# Patient Record
Sex: Female | Born: 1955 | Race: Black or African American | Hispanic: No | Marital: Single | Smoking: Former smoker
Health system: Southern US, Community
[De-identification: ages and names within clinical notes are randomized; demographics above are authoritative.]

## PROBLEM LIST (undated history)

## (undated) DIAGNOSIS — N189 Chronic kidney disease, unspecified: Secondary | ICD-10-CM

## (undated) DIAGNOSIS — D631 Anemia in chronic kidney disease: Secondary | ICD-10-CM

## (undated) DIAGNOSIS — Z973 Presence of spectacles and contact lenses: Secondary | ICD-10-CM

## (undated) DIAGNOSIS — E785 Hyperlipidemia, unspecified: Secondary | ICD-10-CM

## (undated) DIAGNOSIS — E11319 Type 2 diabetes mellitus with unspecified diabetic retinopathy without macular edema: Secondary | ICD-10-CM

## (undated) DIAGNOSIS — E079 Disorder of thyroid, unspecified: Secondary | ICD-10-CM

## (undated) DIAGNOSIS — I10 Essential (primary) hypertension: Secondary | ICD-10-CM

## (undated) DIAGNOSIS — N2581 Secondary hyperparathyroidism of renal origin: Secondary | ICD-10-CM

## (undated) DIAGNOSIS — D649 Anemia, unspecified: Secondary | ICD-10-CM

## (undated) DIAGNOSIS — N950 Postmenopausal bleeding: Secondary | ICD-10-CM

## (undated) DIAGNOSIS — A4902 Methicillin resistant Staphylococcus aureus infection, unspecified site: Secondary | ICD-10-CM

## (undated) DIAGNOSIS — E1340 Other specified diabetes mellitus with diabetic neuropathy, unspecified: Secondary | ICD-10-CM

## (undated) DIAGNOSIS — N183 Chronic kidney disease, stage 3 unspecified: Secondary | ICD-10-CM

## (undated) DIAGNOSIS — E119 Type 2 diabetes mellitus without complications: Secondary | ICD-10-CM

## (undated) DIAGNOSIS — M1990 Unspecified osteoarthritis, unspecified site: Secondary | ICD-10-CM

## (undated) HISTORY — PX: CORNEAL TRANSPLANT: SHX108

## (undated) HISTORY — PX: TRIGGER FINGER RELEASE: SHX641

## (undated) HISTORY — PX: CATARACT EXTRACTION W/ INTRAOCULAR LENS IMPLANT: SHX1309

## (undated) HISTORY — DX: Disorder of thyroid, unspecified: E07.9

## (undated) HISTORY — DX: Chronic kidney disease, unspecified: N18.9

## (undated) HISTORY — PX: CHOLECYSTECTOMY OPEN: SUR202

## (undated) HISTORY — DX: Methicillin resistant Staphylococcus aureus infection, unspecified site: A49.02

## (undated) HISTORY — PX: EYE SURGERY: SHX253

## (undated) HISTORY — DX: Essential (primary) hypertension: I10

## (undated) HISTORY — PX: APPLICATION OF WOUND VAC: SHX5189

## (undated) HISTORY — DX: Type 2 diabetes mellitus without complications: E11.9

## (undated) HISTORY — PX: CHOLECYSTECTOMY: SHX55

## (undated) HISTORY — DX: Hyperlipidemia, unspecified: E78.5

## (undated) HISTORY — DX: Anemia, unspecified: D64.9

---

## 1999-01-19 ENCOUNTER — Other Ambulatory Visit: Admission: RE | Admit: 1999-01-19 | Discharge: 1999-01-19 | Payer: Self-pay | Admitting: Family Medicine

## 1999-07-05 ENCOUNTER — Encounter: Admission: RE | Admit: 1999-07-05 | Discharge: 1999-07-05 | Payer: Self-pay | Admitting: Family Medicine

## 1999-07-05 ENCOUNTER — Encounter: Payer: Self-pay | Admitting: Family Medicine

## 2000-03-27 ENCOUNTER — Other Ambulatory Visit: Admission: RE | Admit: 2000-03-27 | Discharge: 2000-03-27 | Payer: Self-pay | Admitting: Family Medicine

## 2000-04-05 ENCOUNTER — Encounter: Admission: RE | Admit: 2000-04-05 | Discharge: 2000-07-04 | Payer: Self-pay | Admitting: Family Medicine

## 2001-09-12 HISTORY — PX: BILATERAL CARPAL TUNNEL RELEASE: SHX6508

## 2001-09-12 HISTORY — PX: TRIGGER FINGER RELEASE: SHX641

## 2002-01-04 ENCOUNTER — Other Ambulatory Visit: Admission: RE | Admit: 2002-01-04 | Discharge: 2002-01-04 | Payer: Self-pay | Admitting: Family Medicine

## 2002-01-11 ENCOUNTER — Encounter: Payer: Self-pay | Admitting: Family Medicine

## 2002-01-11 ENCOUNTER — Encounter: Admission: RE | Admit: 2002-01-11 | Discharge: 2002-01-11 | Payer: Self-pay | Admitting: Family Medicine

## 2002-03-12 HISTORY — PX: SHOULDER ARTHROSCOPY: SHX128

## 2002-04-16 ENCOUNTER — Encounter: Admission: RE | Admit: 2002-04-16 | Discharge: 2002-04-16 | Payer: Self-pay | Admitting: Family Medicine

## 2002-04-16 ENCOUNTER — Encounter: Payer: Self-pay | Admitting: Family Medicine

## 2002-04-29 ENCOUNTER — Encounter: Payer: Self-pay | Admitting: Surgery

## 2002-04-29 ENCOUNTER — Encounter (INDEPENDENT_AMBULATORY_CARE_PROVIDER_SITE_OTHER): Payer: Self-pay | Admitting: *Deleted

## 2002-04-29 ENCOUNTER — Ambulatory Visit (HOSPITAL_COMMUNITY): Admission: RE | Admit: 2002-04-29 | Discharge: 2002-04-29 | Payer: Self-pay | Admitting: Surgery

## 2002-08-19 ENCOUNTER — Ambulatory Visit (HOSPITAL_COMMUNITY): Admission: RE | Admit: 2002-08-19 | Discharge: 2002-08-19 | Payer: Self-pay | Admitting: Surgery

## 2002-08-19 ENCOUNTER — Encounter: Payer: Self-pay | Admitting: Surgery

## 2002-09-12 DIAGNOSIS — E890 Postprocedural hypothyroidism: Secondary | ICD-10-CM

## 2002-09-12 DIAGNOSIS — Z85850 Personal history of malignant neoplasm of thyroid: Secondary | ICD-10-CM

## 2002-09-12 HISTORY — PX: SHOULDER ARTHROSCOPY: SHX128

## 2002-09-12 HISTORY — PX: THYROID SURGERY: SHX805

## 2002-09-12 HISTORY — DX: Personal history of malignant neoplasm of thyroid: Z85.850

## 2002-09-12 HISTORY — DX: Postprocedural hypothyroidism: E89.0

## 2002-10-03 ENCOUNTER — Encounter (INDEPENDENT_AMBULATORY_CARE_PROVIDER_SITE_OTHER): Payer: Self-pay | Admitting: Specialist

## 2002-10-03 ENCOUNTER — Observation Stay (HOSPITAL_COMMUNITY): Admission: RE | Admit: 2002-10-03 | Discharge: 2002-10-04 | Payer: Self-pay | Admitting: Surgery

## 2002-10-03 HISTORY — PX: TOTAL THYROIDECTOMY: SHX2547

## 2004-02-13 ENCOUNTER — Encounter: Admission: RE | Admit: 2004-02-13 | Discharge: 2004-02-13 | Payer: Self-pay | Admitting: Surgery

## 2004-02-20 ENCOUNTER — Ambulatory Visit (HOSPITAL_COMMUNITY): Admission: RE | Admit: 2004-02-20 | Discharge: 2004-02-20 | Payer: Self-pay | Admitting: Surgery

## 2004-02-23 ENCOUNTER — Ambulatory Visit (HOSPITAL_COMMUNITY): Admission: RE | Admit: 2004-02-23 | Discharge: 2004-02-23 | Payer: Self-pay | Admitting: Surgery

## 2004-03-29 ENCOUNTER — Encounter (HOSPITAL_COMMUNITY): Admission: RE | Admit: 2004-03-29 | Discharge: 2004-06-27 | Payer: Self-pay | Admitting: Surgery

## 2004-04-16 ENCOUNTER — Encounter: Admission: RE | Admit: 2004-04-16 | Discharge: 2004-04-16 | Payer: Self-pay | Admitting: Surgery

## 2004-05-24 ENCOUNTER — Encounter (INDEPENDENT_AMBULATORY_CARE_PROVIDER_SITE_OTHER): Payer: Self-pay | Admitting: *Deleted

## 2004-05-24 ENCOUNTER — Ambulatory Visit (HOSPITAL_COMMUNITY): Admission: RE | Admit: 2004-05-24 | Discharge: 2004-05-24 | Payer: Self-pay | Admitting: Endocrinology

## 2004-08-20 ENCOUNTER — Ambulatory Visit (HOSPITAL_COMMUNITY): Admission: RE | Admit: 2004-08-20 | Discharge: 2004-08-20 | Payer: Self-pay | Admitting: Endocrinology

## 2004-08-30 ENCOUNTER — Encounter (HOSPITAL_COMMUNITY): Admission: RE | Admit: 2004-08-30 | Discharge: 2004-09-09 | Payer: Self-pay | Admitting: Endocrinology

## 2005-01-12 ENCOUNTER — Other Ambulatory Visit: Admission: RE | Admit: 2005-01-12 | Discharge: 2005-01-12 | Payer: Self-pay | Admitting: Family Medicine

## 2007-03-02 ENCOUNTER — Other Ambulatory Visit: Admission: RE | Admit: 2007-03-02 | Discharge: 2007-03-02 | Payer: Self-pay | Admitting: Family Medicine

## 2011-01-28 NOTE — Op Note (Signed)
NAME:  Courtney [name], Courtney [name]                      ACCOUNT NO.:  [id number]   MEDICAL RECORD NO.:  [id number]                   PATIENT TYPE:  AMB   LOCATION:  DAY                                  FACILITY:  [facility]   PHYSICIAN:  Coralie Keens, M.D.              DATE OF BIRTH:  Sep 08, 1956   DATE OF PROCEDURE:  10/03/2002  DATE OF DISCHARGE:                                 OPERATIVE REPORT   PREOPERATIVE DIAGNOSIS:  Multinodular goiter.   POSTOPERATIVE DIAGNOSIS:  Multinodular goiter.   PROCEDURE:  Total thyroidectomy.   SURGEON:  Douglas A. Ninfa Linden, M.D.   ASSISTANT:  Haywood Lasso, M.D.   ANESTHESIA:  General endotracheal.   ESTIMATED BLOOD LOSS:  Minimal.   FINDINGS:  The patient was found to have a multinodular goiterous thyroid  with a large portion being on the right thyroid gland.   PROCEDURE IN DETAIL:  The patient was brought to the operating room and  identified as Courtney [name].  She was placed supine on the operating table,  and general anesthesia was induced.  Her neck was then prepped and draped in  the usual sterile fashion.  A shoulder roll was placed prior to this.  Next,  a small transverse incision was made across the patient's lower neck.  The  incision was carried down through the platysma with the electrocautery.  The  anterior and superior skin flaps were then created with the electrocautery.  Next, the midline was identified and opened with the electrocautery.  The  underlying muscle layers were then retracted as dissection was carried out  on the left side of the neck.  The underlying thyroid gland was identified  and found to contain multiple small nodules.  The gland was elevated up out  of the neck.  Dissection was carried close to the capsule of the thyroid  gland.  The middle vein was easily identified and clipped once proximally  and distally and transected.  The superior pole was next dissected free.  The vessels of the superior pole were  controlled with 2-0 silk ties as well  as surgical clips before being transected.  Dissection was then carried  around the bases, and the vessels to the base were clipped, transecting them  as well.  The recurrent laryngeal nerve was identified on the left and  spared.  It was found to be present in the tracheoesophageal crus.  The  thyroid gland was then dissected further, pulling it up out of the neck,  dissecting along the trachea, and severing the attachments with the  electrocautery.  The isthmus was easily identified and included in the  dissection.  Next, our attention was turned toward the right side of the  neck.  The right lobe was tremendously larger with a very large nodule here,  compressing the trachea.  Several of the muscles were quite adherent to the  gland, and it was difficult to dissect them  off the top surface of the right  lobe of the thyroid gland.  Once again, the middle vein was identified and  clipped as the thyroid was elevated out of the neck.  The superior pole was  then again dissected free, and the vessels to the pole were taken down with  silk ties and surgical clips.  Dissection was then carried further around  the big nodule, and recurrent laryngeal nerve again was easily identified.  Dissection was continued close to the capsule and gland, and several  bridging veins and veins of the pole were clipped with small surgical clips  as the gland was further lifted out of the neck.  The remaining attachments  to the trachea were taken down with the electrocautery.  The entire specimen  was then completely removed from the neck and sent to pathology for  identification.  Both sides of the neck were examined, and hemostasis was  felt to be achieved after irrigating with saline.  A piece of Surgicel was  placed on each side of the neck.  Again, only the superior parathyroid on  each side could be identified.  The dissection, however, was carried right  on the gland  with the thyroid, and no other parathyroids were identified.  Once again, hemostasis was assured.  The midline was then closed with a  running 2-0 Vicryl suture.  The platysma was reapproximated with interrupted  3-0 Vicryl sutures, and the skin was closed with a running 4-0 Monocryl.  Steri-Strips, gauze, and tape were then applied.  The patient tolerated the  procedure well.  All sponge, needle, and instrument counts were correct at  the end of the procedure.  The patient was then extubated in the operating  room and taken in stable condition to the recovery room.                                               Coralie Keens, M.D.    DB/MEDQ  D:  10/03/2002  T:  10/03/2002  Job:  [id number]   cc:   Daiva Eves, M.D.

## 2011-09-13 DIAGNOSIS — A4902 Methicillin resistant Staphylococcus aureus infection, unspecified site: Secondary | ICD-10-CM

## 2011-09-13 HISTORY — DX: Methicillin resistant Staphylococcus aureus infection, unspecified site: A49.02

## 2012-06-12 DIAGNOSIS — Z8614 Personal history of Methicillin resistant Staphylococcus aureus infection: Secondary | ICD-10-CM

## 2012-06-12 HISTORY — DX: Personal history of Methicillin resistant Staphylococcus aureus infection: Z86.14

## 2012-07-06 LAB — COMPREHENSIVE METABOLIC PANEL
Albumin: 2.5 g/dL — ABNORMAL LOW (ref 3.4–5.0)
Alkaline Phosphatase: 185 U/L — ABNORMAL HIGH (ref 50–136)
Anion Gap: 14 (ref 7–16)
BUN: 12 mg/dL (ref 7–18)
Bilirubin,Total: 0.8 mg/dL (ref 0.2–1.0)
Calcium, Total: 8.6 mg/dL (ref 8.5–10.1)
Co2: 23 mmol/L (ref 21–32)
EGFR (African American): 49 — ABNORMAL LOW
EGFR (Non-African Amer.): 43 — ABNORMAL LOW
Potassium: 3.8 mmol/L (ref 3.5–5.1)
SGOT(AST): 41 U/L — ABNORMAL HIGH (ref 15–37)

## 2012-07-06 LAB — CBC
HCT: 37.6 % (ref 35.0–47.0)
HGB: 12.3 g/dL (ref 12.0–16.0)
MCH: 28 pg (ref 26.0–34.0)
MCHC: 32.7 g/dL (ref 32.0–36.0)
MCV: 86 fL (ref 80–100)
RBC: 4.38 10*6/uL (ref 3.80–5.20)
RDW: 13.7 % (ref 11.5–14.5)

## 2012-07-07 ENCOUNTER — Inpatient Hospital Stay: Payer: Self-pay | Admitting: Family Medicine

## 2012-07-07 LAB — URINALYSIS, COMPLETE
Glucose,UR: >=500 mg/dL (ref 0–75)
Leukocyte Esterase: NEGATIVE
Nitrite: NEGATIVE
Protein: NEGATIVE
Specific Gravity: 1.025 (ref 1.003–1.030)

## 2012-07-08 HISTORY — PX: INCISION AND DRAINAGE ABSCESS: SHX5864

## 2012-07-08 LAB — CBC WITH DIFFERENTIAL/PLATELET
HCT: 33.8 % — ABNORMAL LOW (ref 35.0–47.0)
MCHC: 31.2 g/dL — ABNORMAL LOW (ref 32.0–36.0)
MCV: 85 fL (ref 80–100)
Monocyte %: 7.3 %
Neutrophil #: 18.3 10*3/uL — ABNORMAL HIGH (ref 1.4–6.5)
Platelet: 221 10*3/uL (ref 150–440)
RDW: 13.8 % (ref 11.5–14.5)
WBC: 20.8 10*3/uL — ABNORMAL HIGH (ref 3.6–11.0)

## 2012-07-08 LAB — COMPREHENSIVE METABOLIC PANEL
Albumin: 1.8 g/dL — ABNORMAL LOW (ref 3.4–5.0)
Alkaline Phosphatase: 191 U/L — ABNORMAL HIGH (ref 50–136)
Anion Gap: 12 (ref 7–16)
BUN: 21 mg/dL — ABNORMAL HIGH (ref 7–18)
Bilirubin,Total: 0.9 mg/dL (ref 0.2–1.0)
Chloride: 98 mmol/L (ref 98–107)
Creatinine: 2.35 mg/dL — ABNORMAL HIGH (ref 0.60–1.30)
EGFR (African American): 26 — ABNORMAL LOW
SGOT(AST): 59 U/L — ABNORMAL HIGH (ref 15–37)
SGPT (ALT): 71 U/L (ref 12–78)
Total Protein: 6.4 g/dL (ref 6.4–8.2)

## 2012-07-09 LAB — CBC WITH DIFFERENTIAL/PLATELET
Eosinophil #: 0.4 10*3/uL (ref 0.0–0.7)
Eosinophil %: 2.1 %
Lymphocyte #: 0.8 10*3/uL — ABNORMAL LOW (ref 1.0–3.6)
MCH: 27.4 pg (ref 26.0–34.0)
MCHC: 32.3 g/dL (ref 32.0–36.0)
MCV: 85 fL (ref 80–100)
Monocyte #: 1.1 x10 3/mm — ABNORMAL HIGH (ref 0.2–0.9)
Monocyte %: 5.6 %
Neutrophil %: 87.7 %
Platelet: 235 10*3/uL (ref 150–440)
RBC: 3.81 10*6/uL (ref 3.80–5.20)
RDW: 14 % (ref 11.5–14.5)
WBC: 18.8 10*3/uL — ABNORMAL HIGH (ref 3.6–11.0)

## 2012-07-09 LAB — BASIC METABOLIC PANEL
Calcium, Total: 7.9 mg/dL — ABNORMAL LOW (ref 8.5–10.1)
Co2: 21 mmol/L (ref 21–32)
Creatinine: 2.88 mg/dL — ABNORMAL HIGH (ref 0.60–1.30)
EGFR (Non-African Amer.): 18 — ABNORMAL LOW
Glucose: 265 mg/dL — ABNORMAL HIGH (ref 65–99)
Osmolality: 284 (ref 275–301)
Potassium: 3.7 mmol/L (ref 3.5–5.1)
Sodium: 134 mmol/L — ABNORMAL LOW (ref 136–145)

## 2012-07-10 LAB — PROTEIN / CREATININE RATIO, URINE
Creatinine, Urine: 69.7 mg/dL (ref 30.0–125.0)
Protein, Random Urine: 33 mg/dL — ABNORMAL HIGH (ref 0–12)

## 2012-07-10 LAB — BASIC METABOLIC PANEL
BUN: 29 mg/dL — ABNORMAL HIGH (ref 7–18)
Calcium, Total: 7.3 mg/dL — ABNORMAL LOW (ref 8.5–10.1)
Chloride: 108 mmol/L — ABNORMAL HIGH (ref 98–107)
Creatinine: 2.45 mg/dL — ABNORMAL HIGH (ref 0.60–1.30)
Osmolality: 291 (ref 275–301)
Potassium: 3.6 mmol/L (ref 3.5–5.1)

## 2012-07-11 LAB — CBC WITH DIFFERENTIAL/PLATELET
Basophil #: 0 10*3/uL (ref 0.0–0.1)
Basophil %: 0.5 %
HGB: 9.6 g/dL — ABNORMAL LOW (ref 12.0–16.0)
Lymphocyte #: 0.8 10*3/uL — ABNORMAL LOW (ref 1.0–3.6)
Lymphocyte %: 9.9 %
MCH: 27.3 pg (ref 26.0–34.0)
MCV: 84 fL (ref 80–100)
Monocyte %: 10.2 %
Neutrophil #: 6.1 10*3/uL (ref 1.4–6.5)
Neutrophil %: 76.2 %
Platelet: 246 10*3/uL (ref 150–440)
RBC: 3.5 10*6/uL — ABNORMAL LOW (ref 3.80–5.20)
WBC: 8 10*3/uL (ref 3.6–11.0)

## 2012-07-11 LAB — BASIC METABOLIC PANEL
Anion Gap: 9 (ref 7–16)
BUN: 22 mg/dL — ABNORMAL HIGH (ref 7–18)
Calcium, Total: 7.6 mg/dL — ABNORMAL LOW (ref 8.5–10.1)
EGFR (African American): 31 — ABNORMAL LOW
EGFR (Non-African Amer.): 26 — ABNORMAL LOW
Glucose: 232 mg/dL — ABNORMAL HIGH (ref 65–99)
Osmolality: 288 (ref 275–301)

## 2012-07-12 LAB — BASIC METABOLIC PANEL
Anion Gap: 12 (ref 7–16)
BUN: 18 mg/dL (ref 7–18)
Calcium, Total: 7.5 mg/dL — ABNORMAL LOW (ref 8.5–10.1)
Chloride: 111 mmol/L — ABNORMAL HIGH (ref 98–107)
Co2: 20 mmol/L — ABNORMAL LOW (ref 21–32)
Glucose: 261 mg/dL — ABNORMAL HIGH (ref 65–99)
Osmolality: 296 (ref 275–301)
Potassium: 3.8 mmol/L (ref 3.5–5.1)
Sodium: 143 mmol/L (ref 136–145)

## 2012-07-12 LAB — CULTURE, BLOOD (SINGLE)

## 2012-07-13 LAB — BASIC METABOLIC PANEL
BUN: 15 mg/dL (ref 7–18)
Co2: 22 mmol/L (ref 21–32)
Creatinine: 1.66 mg/dL — ABNORMAL HIGH (ref 0.60–1.30)
EGFR (African American): 40 — ABNORMAL LOW
EGFR (Non-African Amer.): 34 — ABNORMAL LOW
Glucose: 217 mg/dL — ABNORMAL HIGH (ref 65–99)
Osmolality: 294 (ref 275–301)
Potassium: 4 mmol/L (ref 3.5–5.1)
Sodium: 144 mmol/L (ref 136–145)

## 2012-07-13 LAB — VANCOMYCIN, TROUGH: Vancomycin, Trough: 13 ug/mL (ref 10–20)

## 2012-07-14 LAB — BASIC METABOLIC PANEL
Anion Gap: 8 (ref 7–16)
Calcium, Total: 7.4 mg/dL — ABNORMAL LOW (ref 8.5–10.1)
Co2: 23 mmol/L (ref 21–32)
Creatinine: 1.48 mg/dL — ABNORMAL HIGH (ref 0.60–1.30)
EGFR (African American): 45 — ABNORMAL LOW
EGFR (Non-African Amer.): 39 — ABNORMAL LOW
Glucose: 255 mg/dL — ABNORMAL HIGH (ref 65–99)
Potassium: 3.7 mmol/L (ref 3.5–5.1)
Sodium: 142 mmol/L (ref 136–145)

## 2012-07-15 LAB — BASIC METABOLIC PANEL
BUN: 9 mg/dL (ref 7–18)
Chloride: 109 mmol/L — ABNORMAL HIGH (ref 98–107)
Co2: 21 mmol/L (ref 21–32)
Creatinine: 1.44 mg/dL — ABNORMAL HIGH (ref 0.60–1.30)
Potassium: 3.8 mmol/L (ref 3.5–5.1)
Sodium: 142 mmol/L (ref 136–145)

## 2012-07-16 LAB — PLATELET COUNT: Platelet: 293 10*3/uL (ref 150–440)

## 2013-11-01 ENCOUNTER — Other Ambulatory Visit: Payer: Self-pay | Admitting: Family Medicine

## 2013-11-01 DIAGNOSIS — Z1231 Encounter for screening mammogram for malignant neoplasm of breast: Secondary | ICD-10-CM

## 2013-11-19 ENCOUNTER — Ambulatory Visit: Payer: Self-pay

## 2013-11-29 ENCOUNTER — Ambulatory Visit
Admission: RE | Admit: 2013-11-29 | Discharge: 2013-11-29 | Disposition: A | Discharge status: Home / Self Care | Payer: BC Managed Care – PPO | Source: Ambulatory Visit | Attending: Family Medicine | Admitting: Family Medicine

## 2013-11-29 DIAGNOSIS — Z1231 Encounter for screening mammogram for malignant neoplasm of breast: Secondary | ICD-10-CM

## 2014-02-28 ENCOUNTER — Ambulatory Visit: Payer: Self-pay | Admitting: Nephrology

## 2014-08-22 ENCOUNTER — Ambulatory Visit (INDEPENDENT_AMBULATORY_CARE_PROVIDER_SITE_OTHER): Payer: BC Managed Care – PPO | Admitting: Internal Medicine

## 2014-08-22 ENCOUNTER — Encounter: Payer: Self-pay | Admitting: Internal Medicine

## 2014-08-22 VITALS — BP 122/68 | HR 78 | Temp 98.3°F | Resp 12 | Ht 67.5 in | Wt 244.0 lb

## 2014-08-22 DIAGNOSIS — E1129 Type 2 diabetes mellitus with other diabetic kidney complication: Secondary | ICD-10-CM

## 2014-08-22 DIAGNOSIS — C73 Malignant neoplasm of thyroid gland: Secondary | ICD-10-CM

## 2014-08-22 DIAGNOSIS — E1121 Type 2 diabetes mellitus with diabetic nephropathy: Secondary | ICD-10-CM

## 2014-08-22 DIAGNOSIS — E1165 Type 2 diabetes mellitus with hyperglycemia: Secondary | ICD-10-CM

## 2014-08-22 DIAGNOSIS — IMO0002 Reserved for concepts with insufficient information to code with codable children: Secondary | ICD-10-CM

## 2014-08-22 MED ORDER — INSULIN GLARGINE 300 UNIT/ML ~~LOC~~ SOPN: 60.0000 [IU] | PEN_INJECTOR | Freq: Every day | SUBCUTANEOUS | Status: DC

## 2014-08-22 MED ORDER — INSULIN ASPART 100 UNIT/ML FLEXPEN: 15.0000 [IU] | PEN_INJECTOR | Freq: Three times a day (TID) | SUBCUTANEOUS | Status: DC

## 2014-08-22 NOTE — Patient Instructions (Signed)
Please stop the 70/30 insulin regimen and start: - Toujeo 60 units at bedtime - NovoLog: 15 units with a regular meal 20 units with a larger meal  Please stop at the lab.  Please return in 1 month with your sugar log.   PATIENT INSTRUCTIONS FOR TYPE 2 DIABETES:  **Please join MyChart!** - see attached instructions about how to join if you have not done so already.  DIET AND EXERCISE Diet and exercise is an important part of diabetic treatment.  We recommended aerobic exercise in the form of brisk walking (working between 40-60% of maximal aerobic capacity, similar to brisk walking) for 150 minutes per week (such as 30 minutes five days per week) along with 3 times per week performing 'resistance' training (using various gauge rubber tubes with handles) 5-10 exercises involving the major muscle groups (upper body, lower body and core) performing 10-15 repetitions (or near fatigue) each exercise. Start at half the above goal but build slowly to reach the above goals. If limited by weight, joint pain, or disability, we recommend daily walking in a swimming pool with water up to waist to reduce pressure from joints while allow for adequate exercise.    BLOOD GLUCOSES Monitoring your blood glucoses is important for continued management of your diabetes. Please check your blood glucoses 2-4 times a day: fasting, before meals and at bedtime (you can rotate these measurements - e.g. one day check before the 3 meals, the next day check before 2 of the meals and before bedtime, etc.).   HYPOGLYCEMIA (low blood sugar) Hypoglycemia is usually a reaction to not eating, exercising, or taking too much insulin/ other diabetes drugs.  Symptoms include tremors, sweating, hunger, confusion, headache, etc. Treat IMMEDIATELY with 15 grams of Carbs: . 4 glucose tablets .  cup regular juice/soda . 2 tablespoons raisins . 4 teaspoons sugar . 1 tablespoon honey Recheck blood glucose in 15 mins and repeat above  if still symptomatic/blood glucose <100.  RECOMMENDATIONS TO REDUCE YOUR RISK OF DIABETIC COMPLICATIONS: * Take your prescribed MEDICATION(S) * Follow a DIABETIC diet: Complex carbs, fiber rich foods, (monounsaturated and polyunsaturated) fats * AVOID saturated/trans fats, high fat foods, >2,300 mg salt per day. * EXERCISE at least 5 times a week for 30 minutes or preferably daily.  * DO NOT SMOKE OR DRINK more than 1 drink a day. * Check your FEET every day. Do not wear tightfitting shoes. Contact us if you develop an ulcer * See your EYE doctor once a year or more if needed * Get a FLU shot once a year * Get a PNEUMONIA vaccine once before and once after age 65 years  GOALS:  * Your Hemoglobin A1c of <7%  * fasting sugars need to be <130 * after meals sugars need to be <180 (2h after you start eating) * Your Systolic BP should be [id number] or lower  * Your Diastolic BP should be 80 or lower  * Your HDL (Good Cholesterol) should be 40 or higher  * Your LDL (Bad Cholesterol) should be 100 or lower. * Your Triglycerides should be 150 or lower  * Your Urine microalbumin (kidney function) should be <30 * Your Body Mass Index should be 25 or lower    Please consider the following ways to cut down carbs and fat and increase fiber and micronutrients in your diet: - substitute whole grain for white bread or pasta - substitute brown rice for white rice - substitute 90-calorie flat bread pieces for slices of  bread when possible - substitute sweet potatoes or yams for white potatoes - substitute humus for margarine - substitute tofu for cheese when possible - substitute almond or rice milk for regular milk (would not drink soy milk daily due to concern for soy estrogen influence on breast cancer risk) - substitute dark chocolate for other sweets when possible - substitute water - can add lemon or orange slices for taste - for diet sodas (artificial sweeteners will trick your body that you can eat  sweets without getting calories and will lead you to overeating and weight gain in the long run) - do not skip breakfast or other meals (this will slow down the metabolism and will result in more weight gain over time)  - can try smoothies made from fruit and almond/rice milk in am instead of regular breakfast - can also try old-fashioned (not instant) oatmeal made with almond/rice milk in am - order the dressing on the side when eating salad at a restaurant (pour less than half of the dressing on the salad) - eat as little meat as possible - can try juicing, but should not forget that juicing will get rid of the fiber, so would alternate with eating raw veg./fruits or drinking smoothies - use as little oil as possible, even when using olive oil - can dress a salad with a mix of balsamic vinegar and lemon juice, for e.g. - use agave nectar, stevia sugar, or regular sugar rather than artificial sweateners - steam or broil/roast veggies  - snack on veggies/fruit/nuts (unsalted, preferably) when possible, rather than processed foods - reduce or eliminate aspartame in diet (it is in diet sodas, chewing gum, etc) Read the labels!  Try to read Dr. Janene Harvey book: "Program for Reversing Diabetes" for other ideas for healthy eating.

## 2014-08-22 NOTE — Progress Notes (Addendum)
Patient ID: Courtney [name], female   DOB: [birth year], 58 y.o.   MRN: [id number]  HPI: Courtney [name] is a 58 y.o.-year-old female, referred by her PCP, Dr. Daiva Eves, for management of DM2, dx in ~2000, insulin-dependent since 2011, uncontrolled, with complications (CKD - sees nephrology, DR) also postsurgical hypothyroidism for Thyroid cancer.  DM2: Last hemoglobin A1c was: 08/01/2014: HbA1c 14.5% 05/02/2014: HbA1c 14.8% 01/31/2014: HbA1c 13.6% 11/01/2013: HbA1c 15.1%  Pt is on a regimen of: - Novolin 70/30 48 units 2x a day 15 min before the meals - but may forget - misses 5/14 doses a week - Amaryl 8 mg in am She was on Metformin >> stopped 2/2 CKD.  Pt travels for her job, and works both day + night shift, 4 days a week>> just started to check her sugars 1x a day and they are: - am: 308-333, before: 150s - 2h after b'fast: n/c - before lunch: n/c - 2h after lunch: n/c - before dinner: n/c - 2h after dinner: n/c - bedtime: n/c - nighttime: n/c No lows. Lowest sugar was 157; ? hypoglycemia awareness Highest sugar was 333.  Glucometer: Prodigy  Pt's meals are - she started to make changes - but still skips: - Breakfast: varies - mostly eats out - may skip. 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  Pt has FH of DM in older sister, mother with prediabetes.  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 She did not have RAI treatment at that time, however, she was found to have a thyroid mass in 2005. This was biopsied and returned as normal thyroid tissue. However, she had RAI treatment at that time. The posttreatment whole-body scan was negative for any cancer spread. She did not have consistent follow-up in the last 10 years  No neck compression symptoms.  Last TSH: 08/01/2014: TSH 1.89  She is on LT4 150 mcg daily: - in am - with water - eats b'fast 30 min later - No calcium, iron, PPI, MVI  ROS: Constitutional: no weight gain/loss, no fatigue, + subjective hypothermia Eyes: no blurry vision, no xerophthalmia ENT: no sore throat, see history of present illness, no hoarseness Cardiovascular: no CP/SOB/palpitations/+ leg swelling Respiratory: no cough/SOB Gastrointestinal: no N/V/D/C Musculoskeletal: no muscle/joint aches Skin: no rashes, + itching Neurological: no tremors/numbness/tingling/dizziness Psychiatric: no depression/anxiety  Past Medical History  Diagnosis Date  . Diabetes mellitus without complication   . Hypertension   . Thyroid disease   . Chronic kidney disease   . Anemia   . MRSA infection 2013    on leg; surgery    Past Surgical History  Procedure Laterality Date  . 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 Cholecystectomy    . Trigger finger release  2003  . Bilateral carpal tunnel release Bilateral 2003  . Shoulder arthroscopy  2004  . Thyroid surgery  2004   History   Social History Main Topics  . Smoking status: Former Research scientist (life sciences)  . Smokeless tobacco: Not on file  . Alcohol Use: No  . Drug Use: No   Social History Narrative   Single   0 children   Copy (travels regularly)      Beer and wine on occasion   First menstrual cycle: 6th grade   Postmenopausal      Current Outpatient Rx  Name  Route  Sig  Dispense  Refill  . amLODipine (NORVASC) 10 MG tablet   Oral    Take 10 mg by mouth daily.          . diphenhydrAMINE (BENADRYL ALLERGY) 25 MG tablet   Oral   Take 25 mg by mouth as needed.          Marland Kitchen glimepiride (AMARYL) 4 MG tablet   Oral   Take 8 mg by mouth daily with breakfast.          . insulin NPH-regular Human (NOVOLIN 70/30) (70-30) 100 UNIT/ML injection   Subcutaneous   Inject 48 Units into the skin 2 (two) times daily with a meal.          . levothyroxine (SYNTHROID, LEVOTHROID) 150 MCG tablet   Oral   Take 150 mcg by mouth daily before breakfast.          . losartan (COZAAR) 100 MG tablet   Oral   Take 100 mg by mouth daily.           NKDA   Family History  Problem Relation Age of Onset  . Hypertension Mother   . Thyroid disease Mother   . Hypertension Sister   . Thyroid disease Sister   . Hypertension Brother   . Hyperlipidemia Brother    PE: BP 122/68 mmHg  Pulse 78  Temp(Src) 98.3 F (36.8 C) (Oral)  Resp 12  Ht 5' 7.5" (1.715 m)  Wt 244 lb (110.678 kg)  BMI 37.63 kg/m2  SpO2 97% Wt Readings from Last 3 Encounters:  08/22/14 244 lb (110.678 kg)   Constitutional: overweight, in NAD Eyes: PERRLA, EOMI, no exophthalmos ENT: moist mucous membranes, no neck masses, no cervical lymphadenopathy Cardiovascular: RRR, No MRG Respiratory: CTA B Gastrointestinal: abdomen soft, NT, ND, BS+ Musculoskeletal: no deformities, strength intact in all 4 Skin: moist, warm, no rashes Neurological: no tremor with outstretched hands, DTR normal in all 4  ASSESSMENT: 1. DM2, insulin-dependent, uncontrolled, with complications - CKD - DR  PLAN:  1. Patient with long-standing, uncontrolled diabetes, on premixed insulin regimen, with intermittent noncompliance with the insulin doses and diet. She recently started to make changes in her diet, however these are not the best, for example, she switched from regular soda as to [place] aid. Recently, she started to add an artificial sweetener in water instead of the sweet  drinks that she was drinking.  Her sugars are very high in the morning and she is not checking later in the day. Advised her to start checking 3 times a day. We discussed about the fact that the premixed insulin regimen, though inexpensive, does not offer her enough flexibility, and I suggested to space to basal bolus regimen. She agrees to try this. -  I suggested to:  Patient Instructions  Please stop the 70/30 insulin regimen and start: - Toujeo 60 units at bedtime - NovoLog: 15 units with a regular meal 20 units with a larger meal  Please stop at the lab.  Please return in 1 month with your sugar log.   - Strongly advised her to start checking sugars at different times of the day - check 3 times a day, rotating checks - given sugar log and advised how to fill it and to bring it at next appt  - given foot care handout and explained the principles  - given instructions for hypoglycemia management "15-15 rule"  - advised for yearly eye exams - Return to clinic in 1 mo with sugar log   2. 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 Please see his HPI for further details. - We'll check a thyroglobulin and antithyroglobulin antibodies today - If the thyroglobulin is elevated, she will need a neck ultrasound - However, we discussed that we can consider her cured, since the cancer was subcentimeter and noninvasive  - time spent with the patient: 1 hour, of which >50% was spent in obtaining information about her diabetes and thyroid cancer, reviewing her previous labs, evaluations, and treatments, counseling her about her conditions (please see the discussed topics above), and developing a plan to further investigate and treat them. She had a number of questions which I addressed.  Component      Latest Ref Rng 08/22/2014  Thyroglobulin     2.8 - 40.9 ng/mL 2.3 (L)  Thyroglobulin Ab     <2 IU/mL <1  Thyroglobulin Antibody     0.0 - 0.9 IU/mL <1.0   Because the thyroglobulin is detectable, I would like to obtain a neck ultrasound to investigate for recurrences.  Thyroid U/S scheduled for 09/19/2013. CLINICAL DATA: Bilateral total thyroidectomy for thyroid cancer.  EXAM: THYROID ULTRASOUND  TECHNIQUE: Ultrasound examination of the thyroid gland and adjacent soft tissues was performed.  COMPARISON: Thyroid ultrasound - 02/13/2004; right thyroid thyroid nodule fine-needle aspiration - 05/24/2004; I-131 nuclear medicine whole-body scan - 08/30/2004  FINDINGS: Right thyroid lobe  There is an approximately 1.4 x 0.8 x 1.0 cm echogenic solid nodule within the inferior aspect of the right thyroid resection bed which appears similar to remote examination performed 02/13/2004 at which time this soft tissue measured approximately 1.8 x 1.0 x 1.0 cm - this nodular soft tissue was previously biopsied on 05/24/2004 and did not definitively demonstrate increased radiotracer uptake on post I-131 nuclear medicine whole-body scan performed 08/30/2004.  Note is made of an approximately 0.4 x 0.4 x 0.4 cm hypoechoic nodule also within the right thyroidectomy bed, not definitely seen on the prior examinations. This nodule may contain a minimal amount of eccentric internal echogenicity which may represent which may represent a fatty hilum.  Left thyroid lobe  Surgically absent. There is no residual nodular soft tissue within the left thyroidectomy bed to suggest locally recurrent disease.  Isthmus  Surgically absent. There is no residual nodular soft tissue within the thyroid isthmic resection bed to suggest locally recurrent disease.  Lymphadenopathy  None visualized.  IMPRESSION: 1. Post total thyroidectomy. 2. No change in the previously biopsied approximately 1.4  cm echogenic solid nodule within the inferior aspect of the right lobe of the thyroid, grossly unchanged since the 2005 examination. 3. Apparent development of an approximately 0.4 cm hypoechoic nodule within the right thyroidectomy bed - while too small for definitive characterization, this nodule appears to contain an echogenic hilum and thus is favored to represent a non pathologically enlarged  cervical lymph node.   Electronically Signed By: Sandi Mariscal M.D. On: 09/19/2014 15:52  No suspicious nodules >> will need to repeat Tg + ATA in ~ 6 mo ([facility]) and thyroid U/S in 1 year.

## 2014-08-25 LAB — THYROGLOBULIN ANTIBODY: Thyroglobulin Antibody: <1 IU/mL (ref 0.0–0.9)

## 2014-08-27 LAB — THYROGLOBULIN ANTIBODY: Thyroglobulin Ab: <1 IU/mL (ref <2)

## 2014-08-27 LAB — THYROGLOBULIN LEVEL: THYROGLOBULIN: 2.3 ng/mL — AB (ref 2.8–40.9)

## 2014-08-28 DIAGNOSIS — E1165 Type 2 diabetes mellitus with hyperglycemia: Secondary | ICD-10-CM

## 2014-08-28 DIAGNOSIS — C73 Malignant neoplasm of thyroid gland: Secondary | ICD-10-CM | POA: Insufficient documentation

## 2014-08-28 DIAGNOSIS — IMO0002 Reserved for concepts with insufficient information to code with codable children: Secondary | ICD-10-CM | POA: Insufficient documentation

## 2014-08-28 DIAGNOSIS — E1121 Type 2 diabetes mellitus with diabetic nephropathy: Secondary | ICD-10-CM | POA: Insufficient documentation

## 2014-09-12 HISTORY — PX: CATARACT EXTRACTION W/ INTRAOCULAR LENS IMPLANT: SHX1309

## 2014-09-15 ENCOUNTER — Encounter: Payer: Self-pay | Admitting: Internal Medicine

## 2014-09-19 ENCOUNTER — Ambulatory Visit
Admission: RE | Admit: 2014-09-19 | Discharge: 2014-09-19 | Disposition: A | Discharge status: Home / Self Care | Payer: Self-pay | Source: Ambulatory Visit | Attending: Internal Medicine | Admitting: Internal Medicine

## 2014-09-22 NOTE — Addendum Note (Signed)
Addended by: [name] on: 09/22/2014 12:40 PM   Modules accepted: Level of Service

## 2014-10-03 ENCOUNTER — Ambulatory Visit: Payer: BC Managed Care – PPO | Admitting: Internal Medicine

## 2014-10-10 ENCOUNTER — Ambulatory Visit (INDEPENDENT_AMBULATORY_CARE_PROVIDER_SITE_OTHER): Payer: BLUE CROSS/BLUE SHIELD | Admitting: Internal Medicine

## 2014-10-10 ENCOUNTER — Encounter: Payer: Self-pay | Admitting: Internal Medicine

## 2014-10-10 VITALS — BP 138/70 | HR 89 | Temp 97.9°F | Resp 12 | Wt 242.0 lb

## 2014-10-10 DIAGNOSIS — E1121 Type 2 diabetes mellitus with diabetic nephropathy: Secondary | ICD-10-CM

## 2014-10-10 DIAGNOSIS — E119 Type 2 diabetes mellitus without complications: Secondary | ICD-10-CM

## 2014-10-10 DIAGNOSIS — C73 Malignant neoplasm of thyroid gland: Secondary | ICD-10-CM

## 2014-10-10 DIAGNOSIS — E1129 Type 2 diabetes mellitus with other diabetic kidney complication: Secondary | ICD-10-CM

## 2014-10-10 DIAGNOSIS — IMO0002 Reserved for concepts with insufficient information to code with codable children: Secondary | ICD-10-CM

## 2014-10-10 DIAGNOSIS — E1165 Type 2 diabetes mellitus with hyperglycemia: Secondary | ICD-10-CM

## 2014-10-10 MED ORDER — INSULIN DEGLUDEC 200 UNIT/ML ~~LOC~~ SOPN: 60.0000 [IU] | PEN_INJECTOR | Freq: Every day | SUBCUTANEOUS | Status: DC

## 2014-10-10 MED ORDER — GLUCOSE BLOOD VI STRP: ORAL_STRIP | Status: DC

## 2014-10-10 NOTE — Progress Notes (Signed)
Patient ID: Courtney [name], female   DOB: 1956/02/09, 59 y.o.   MRN: [id number]  HPI: Courtney [name] is a 59 y.o.-year-old female, initially referred by her PCP, Dr. Daiva Eves, for management of DM2, dx in ~2000, insulin-dependent since 2011, uncontrolled, with complications (CKD - sees nephrology, DR) also postsurgical hypothyroidism for Thyroid cancer. Last visit 1.5 months ago.  DM2: Last hemoglobin A1c was: 08/01/2014: HbA1c 14.5% 05/02/2014: HbA1c 14.8% 01/31/2014: HbA1c 13.6% 11/01/2013: HbA1c 15.1%  Pt was on a regimen of: - Novolin 70/30 48 units 2x a day 15 min before the meals - but may forget - misses 5/14 doses a week - Amaryl 8 mg in am She was on Metformin >> stopped 2/2 CKD.  At last visit, we switched to: - Toujeo 60 units at bedtime - NovoLog 2x a day (skips lunch - joint and mm pain): 15 units with a regular meal 20 units with a larger meal  Pt travels for her job, and works both day + night shift, 4 days a week.  Sugars are greatly improved: - am: 308-333, before: 150s >> 76-150, 160 - 2h after b'fast: n/c - before lunch: n/c >> 72-149 - 2h after lunch: n/c >> 67, 228 - before dinner: n/c >> 65-37, 190-207 (if eats a larger lunch) - 2h after dinner: n/c >> 131, 269 - bedtime: n/c >> 81-160 - nighttime: n/c No lows. Lowest sugar was 65; ? hypoglycemia awareness Highest sugar was  200s.  Glucometer: Prodigy  Pt's meals are - she started to make changes - but still skips: - Breakfast: varies - mostly eats out - may skip. Oatmeal, sausage or bacon, apples - Lunch: peanuts - Dinner: frozen dinner - Snacks: fruit, small bag potato chips, peanuts Drinks Cool Aid instead of sodas now >> started to drink non-sweet drinks  - + stage 3 CKD, last BUN/creatinine:  08/01/2014: 15/1.26, GFR 55 - last set of lipids: 08/01/2014: 145/78/74/55 - last eye exam was in 08/15/2014 >> DR in [facility], had Laser Sx, also getting intraocular injections; L eye cataract - no  numbness and tingling in her feet.  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 She did not have RAI treatment at that time, however, she was found to have a thyroid mass in 2005. This was biopsied and returned as normal thyroid tissue. However, she had RAI treatment at that time. The posttreatment whole-body scan was negative for any cancer spread. She did not have consistent follow-up in the last 10 years  No neck compression symptoms.  At last visit, we checked a thyroglobulin, and this returned at 2.3. At that time, I ordered a neck ultrasound that showed a stable nodule with a fatty hilum, consistent with a benign lymph node, but no other masses in the surgical bed. I plan to repeat the thyroglobulin at [place] in 6 months  Postsurgical hypothyroidism:  Last TSH:  08/01/2014: TSH 1.89  She is on LT4 150 mcg daily: - in am - with water - eats b'fast 30 min later - No calcium, iron, PPI, MVI  ROS: Constitutional: no weight gain/loss, no fatigue, + subjective hypothermia Eyes: no blurry vision, no xerophthalmia ENT: no sore throat, see history of present illness, no hoarseness Cardiovascular: no CP/SOB/palpitations/+ leg swelling Respiratory: no cough/SOB Gastrointestinal: no N/V/D/C Musculoskeletal: no  muscle/joint aches Skin: no rashes, + itching Neurological: no tremors/numbness/tingling/dizziness Psychiatric: no depression/anxiety  I reviewed pt's medications, allergies, PMH, social hx, family hx, and changes were documented in the history of present illness. Otherwise, unchanged from my initial visit note.  Past Medical History  Diagnosis Date  . Diabetes mellitus without complication   . Hypertension   . Thyroid disease   . Chronic kidney disease   . Anemia   .  MRSA infection 2013    on leg; surgery    Past Surgical History  Procedure Laterality Date  . 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 Cholecystectomy    . Trigger finger release  2003  . Bilateral carpal tunnel release Bilateral 2003  . Shoulder arthroscopy  2004  . Thyroid surgery  2004   History   Social History Main Topics  . Smoking status: Former Research scientist (life sciences)  . Smokeless tobacco: Not on file  . Alcohol Use: No  . Drug Use: No   Social History Narrative   Single   0 children   Copy (travels regularly)      Beer and wine on occasion   First menstrual cycle: 6th grade   Postmenopausal      Current Outpatient Rx  Name  Route  Sig  Dispense  Refill  . amLODipine (NORVASC) 10 MG tablet   Oral   Take 10 mg by mouth daily.          . diphenhydrAMINE (BENADRYL ALLERGY) 25 MG tablet   Oral   Take 25 mg by mouth as needed.          Marland Kitchen glimepiride (AMARYL) 4 MG tablet   Oral   Take 8 mg by mouth daily with breakfast.          . insulin aspart (NOVOLOG FLEXPEN) 100 UNIT/ML FlexPen   Subcutaneous   Inject 15-20 Units into the skin 3 (three) times daily with meals. Inject 15 min before meals.   15 mL   2   . Insulin Glargine (TOUJEO SOLOSTAR) 300 UNIT/ML SOPN   Subcutaneous   Inject 60 Units into the skin at bedtime.   3 pen   2   . levothyroxine (SYNTHROID, LEVOTHROID) 150 MCG tablet   Oral   Take 150 mcg by mouth daily before breakfast.          . losartan (COZAAR) 100 MG tablet   Oral   Take 100 mg by mouth daily.           NKDA   Family History  Problem Relation Age of Onset  . Hypertension Mother   . Thyroid disease Mother   . Hypertension Sister   . Thyroid disease Sister   . Hypertension Brother   . Hyperlipidemia Brother    PE: BP 138/70 mmHg  Pulse 89  Temp(Src) 97.9 F (36.6 C) (Oral)  Resp 12  Wt 242 lb (109.77 kg)  SpO2 99% Wt Readings from Last 3 Encounters:  10/10/14 242 lb (109.77 kg)  08/22/14 244 lb (110.678 kg)    Constitutional: overweight, in NAD Eyes: PERRLA, EOMI, no exophthalmos ENT: moist mucous membranes, no neck masses palpable, no cervical lymphadenopathy Cardiovascular: RRR, No MRG Respiratory: CTA B Gastrointestinal: abdomen soft, NT, ND, BS+ Musculoskeletal: no deformities, strength intact in all 4 Skin: moist, warm, no rashes Neurological: no tremor with outstretched hands, DTR normal in all 4  ASSESSMENT: 1. DM2, insulin-dependent, uncontrolled, with complications - CKD - DR  2. 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 She did not have RAI treatment at that time, however, she was found to have a thyroid mass in 2005. This was biopsied and returned as normal thyroid tissue. However, she had RAI treatment at that time. The posttreatment whole-body scan was negative for any cancer spread. She did not have consistent follow-up in the last 10 years  Component     Latest Ref Rng 08/22/2014  Thyroglobulin     2.8 - 40.9 ng/mL 2.3 (L)  Thyroglobulin Ab     <2 IU/mL <1  Thyroglobulin Antibody     0.0 - 0.9 IU/mL <1.0   Last U/S (09/19/2014): 1. Post total thyroidectomy. 2. No change in the previously biopsied approximately 1.4 cm echogenic solid nodule within the inferior aspect of the right lobe of the thyroid, grossly unchanged since the 2005 examination. 3. Apparent development of an approximately 0.4 cm hypoechoic nodule within the right thyroidectomy bed - while too small for definitive characterization, this nodule appears to contain an echogenic hilum and thus is favored to represent a non pathologically enlarged cervical lymph node.  3. Post surgical hypothyroidism  PLAN:  1. Patient with long-standing, uncontrolled diabetes, now on basal-bolus insulin regimen started at last visit, we significantly  improved control. She also started to make changes in her diet, which greatly helps. Due to the fact that she works days alternating with nights, I suggested that we switched from [place] to [place], which can be administered at any time of the day. I advised her to take it at bedtime, whichever that might be: A.m. or evening. We will start the U200 pen, since this contains more units/pen. -  I suggested to:  Patient Instructions  Please stop Toujeo and start Tresiba U200 pen 60 units at bedtime. Continue  NovoLog 15 min before breakfast and dinner: 15 units with a regular meal 20 units with a larger meal  Please return in 1.5 month with your sugar log.  - She continues to Amaryl 8 mg in the morning. I will leave her on this medication, since no significant lows, but I advised her to let me know if she develops more sugars in the 60s. The main reason to continue his the fact that she is not taking insulin with lunch. - Continue checking sugars at different times of the day - check 3 times a day, rotating checks - given more sugar logs  - advised for yearly eye exams she is up-to-date - Return in about 6 weeks (around 11/21/2014).  2. 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 Please see his HPI for further details. - we reviewed her last thyroid ultrasound together, and I explained that the mass that was seen appears to be a lymph node with a fatty hilum, which is a characteristic of benign lymph nodes. - We'll check a thyroglobulin and antithyroglobulin antibodiat next visit, but I would like to order this with the [facility] assay  - However, I believe that we can consider her cured, since the cancer was subcentimeter and noninvasive  3. Postsurgical hypothyroidism - She is taking her levothyroxine  correctly - We will check her TFTs at next visit along with the rest of the labs

## 2014-10-10 NOTE — Patient Instructions (Signed)
Please stop Toujeo and start [place] U200 pen 60 units at bedtime. Continue  NovoLog 15 min before breakfast and dinner: 15 units with a regular meal 20 units with a larger meal  Please return in 1.5 month with your sugar log.

## 2014-11-21 ENCOUNTER — Ambulatory Visit: Payer: BLUE CROSS/BLUE SHIELD | Admitting: Internal Medicine

## 2014-11-24 ENCOUNTER — Telehealth: Payer: Self-pay | Admitting: Internal Medicine

## 2014-11-24 NOTE — Telephone Encounter (Signed)
We can try Humalog. If not covered, then need to use R insulin, but this does not come in a pen.Marland KitchenMarland Kitchen

## 2014-11-24 NOTE — Telephone Encounter (Signed)
Please read message below and advise.  

## 2014-11-24 NOTE — Telephone Encounter (Signed)
Patient stated that her pharmacy is charging her $104.00 for her Novalog, she can't afford that is there another alternative. Please Advise

## 2014-11-25 ENCOUNTER — Other Ambulatory Visit: Payer: Self-pay | Admitting: *Deleted

## 2014-11-25 ENCOUNTER — Telehealth: Payer: Self-pay | Admitting: Internal Medicine

## 2014-11-25 ENCOUNTER — Telehealth: Payer: Self-pay | Admitting: *Deleted

## 2014-11-25 MED ORDER — INSULIN ASPART 100 UNIT/ML ~~LOC~~ SOLN: 15.0000 [IU] | Freq: Three times a day (TID) | SUBCUTANEOUS | Status: DC

## 2014-11-25 MED ORDER — INSULIN PEN NEEDLE 32G X 4 MM MISC: Status: DC

## 2014-11-25 MED ORDER — "INSULIN SYRINGE-NEEDLE U-100 30G X 1/2"" 0.5 ML MISC": Status: DC

## 2014-11-25 MED ORDER — INSULIN LISPRO 100 UNIT/ML (KWIKPEN): 15.0000 [IU] | PEN_INJECTOR | Freq: Three times a day (TID) | SUBCUTANEOUS | Status: DC

## 2014-11-25 MED ORDER — INSULIN REGULAR HUMAN 100 UNIT/ML IJ SOLN: INTRAMUSCULAR | Status: DC

## 2014-11-25 NOTE — Telephone Encounter (Signed)
Returned pt's call. We are going to switch to Novolin R (Relion Brand), same dosage, vials. Pt understood. Rx sent to pt's pharmacy.

## 2014-11-25 NOTE — Telephone Encounter (Signed)
We switched Novolog to Humalog. Ins rejected this as well. Please advise what to do moving forward.

## 2014-11-25 NOTE — Telephone Encounter (Signed)
Called pt and advised her per Dr Arman Filter note. Pt stated that Novolog is not a Tier 1 med. She said we could send the Humalog and she will see if it is covered by her ins and let us know.

## 2014-11-25 NOTE — Telephone Encounter (Signed)
Patient stated that her insulin medication was $200 she is not able to afford it. She stated it was ok to be put on the Vial it is cheaper. Please advise

## 2014-11-25 NOTE — Telephone Encounter (Signed)
Insurance denied Humalog pens. They will cover Novolog. Sending vials instead of pens to see if they will cover this at a lower cost.

## 2014-11-25 NOTE — Telephone Encounter (Signed)
Unfortunately, need R insulin vial. Please also send syringes.

## 2014-12-26 ENCOUNTER — Encounter: Payer: Self-pay | Admitting: Internal Medicine

## 2014-12-26 ENCOUNTER — Ambulatory Visit (INDEPENDENT_AMBULATORY_CARE_PROVIDER_SITE_OTHER): Payer: BLUE CROSS/BLUE SHIELD | Admitting: Internal Medicine

## 2014-12-26 VITALS — BP 108/66 | HR 76 | Temp 98.2°F | Resp 12 | Wt 235.0 lb

## 2014-12-26 DIAGNOSIS — C73 Malignant neoplasm of thyroid gland: Secondary | ICD-10-CM | POA: Diagnosis not present

## 2014-12-26 DIAGNOSIS — E890 Postprocedural hypothyroidism: Secondary | ICD-10-CM | POA: Diagnosis not present

## 2014-12-26 DIAGNOSIS — E1165 Type 2 diabetes mellitus with hyperglycemia: Secondary | ICD-10-CM

## 2014-12-26 DIAGNOSIS — E1129 Type 2 diabetes mellitus with other diabetic kidney complication: Secondary | ICD-10-CM | POA: Diagnosis not present

## 2014-12-26 DIAGNOSIS — IMO0002 Reserved for concepts with insufficient information to code with codable children: Secondary | ICD-10-CM

## 2014-12-26 DIAGNOSIS — E1121 Type 2 diabetes mellitus with diabetic nephropathy: Secondary | ICD-10-CM

## 2014-12-26 LAB — HEMOGLOBIN A1C: HEMOGLOBIN A1C: 10.9 % — AB (ref 4.6–6.5)

## 2014-12-26 LAB — T4, FREE: FREE T4: 1 ng/dL (ref 0.60–1.60)

## 2014-12-26 LAB — TSH: TSH: 2.72 u[IU]/mL (ref 0.35–4.50)

## 2014-12-26 NOTE — Progress Notes (Signed)
Patient ID: Courtney [name], female   DOB: 07-21-1956, 59 y.o.   MRN: [id number]  HPI: Courtney [name] is a 59 y.o.-year-old female-year-old female, initially referred by her PCP, Dr. Daiva Eves, for management of DM2, dx in ~2000, insulin-dependent since 2011, uncontrolled, with complications (CKD - sees nephrology, DR) also postsurgical hypothyroidism for Thyroid cancer. Last visit 2.5 months ago.  DM2: Last hemoglobin A1c was: 08/01/2014: HbA1c 14.5% 05/02/2014: HbA1c 14.8% 01/31/2014: HbA1c 13.6% 11/01/2013: HbA1c 15.1%  Pt was on a regimen of: - Novolin 70/30 48 units 2x a day 15 min before the meals - but may forget - misses 5/14 doses a week - Amaryl 8 mg in am She was on Metformin >> stopped 2/2 CKD.  At last visit, we switched to: - Toujeo >> Tresiba U200 60 units at bedtime - ReliOn Novolin 2x a day (skips lunch - joint and mm pain): 15 units with a regular meal 20 units with a larger meal  She did not check sugars in last month. Before this: - am: 308-333, before: 150s >> 76-150, 160 >> 135-140 - 2h after b'fast: n/c - before lunch: n/c >> 72-149 >> n/c - 2h after lunch: n/c >> 67, 228 >> n/c - before dinner: n/c >> 65-37, 190-207 >> 135-140 - 2h after dinner: n/c >> 131, 269 >> n/c - bedtime: n/c >> 81-160 >> n/c - nighttime: n/c No lows. Lowest sugar was 98; ? hypoglycemia awareness Highest sugar was 140s  She goes to work at 8 pm.   Glucometer: Prodigy  Pt's meals are - she started to make changes - but still skips: - Breakfast: varies - mostly eats out - may skip. Oatmeal, sausage or bacon, apples - Lunch: peanuts - Dinner: frozen dinner - Snacks: fruit, small bag potato chips, peanuts Drinks Cool Aid instead of sodas now >> started to drink non-sweet drinks  - + stage 3 CKD, last BUN/creatinine:  08/01/2014: 15/1.26, GFR 55 - last set of lipids: 08/01/2014: 145/78/74/55 - last eye exam was in 08/15/2014 >> DR in [facility], had Laser Sx, also getting intraocular  injections; L eye cataract - no numbness and tingling in her feet.  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 She did not have RAI treatment at that time, however, she was found to have a thyroid mass in 2005. This was biopsied and returned as normal thyroid tissue. However, she had RAI treatment at that time. The posttreatment whole-body scan was negative for any cancer spread. She did not have consistent follow-up in the last 10 years  No neck compression symptoms.  A previous thyroglobulin returned at 2.3. At that time, I ordered a neck ultrasound that showed a stable nodule with a fatty hilum, consistent with a benign lymph node, but no other masses in the surgical bed. I planned to repeat the thyroglobulin at [place] in 6 months.  Postsurgical hypothyroidism: Last TSH:  08/01/2014: TSH 1.89  She is on LT4 150 mcg daily: - in am, before her dinner - with water - eats b'fast 30 min later - No calcium, iron, PPI, MVI  ROS: Constitutional: no weight gain/loss, no fatigue, + subjective hypothermia Eyes: no blurry vision, no xerophthalmia ENT: no sore throat, see history of present illness, no hoarseness Cardiovascular: no CP/SOB/palpitations/+ leg swelling Respiratory: no cough/SOB Gastrointestinal:  no N/V/D/C Musculoskeletal: no muscle/joint aches Skin: no rashes, + itching Neurological: no tremors/numbness/tingling/dizziness Psychiatric: no depression/anxiety  I reviewed pt's medications, allergies, PMH, social hx, family hx, and changes were documented in the history of present illness. Otherwise, unchanged from my initial visit note.  Past Medical History  Diagnosis Date  . Diabetes mellitus without complication   . Hypertension   . Thyroid disease   . Chronic  kidney disease   . Anemia   . MRSA infection 2013    on leg; surgery    Past Surgical History  Procedure Laterality Date  . 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 Cholecystectomy    . Trigger finger release  2003  . Bilateral carpal tunnel release Bilateral 2003  . Shoulder arthroscopy  2004  . Thyroid surgery  2004   History   Social History Main Topics  . Smoking status: Former Research scientist (life sciences)  . Smokeless tobacco: Not on file  . Alcohol Use: No  . Drug Use: No   Social History Narrative   Single   0 children   [place] (travels regularly)      Beer and wine on occasion   First menstrual cycle: 6th grade   Postmenopausal      Current Outpatient Rx  Name  Route  Sig  Dispense  Refill  . amLODipine (NORVASC) 10 MG tablet   Oral   Take 10 mg by mouth daily.          . diphenhydrAMINE (BENADRYL ALLERGY) 25 MG tablet   Oral   Take 25 mg by mouth as needed.          Marland Kitchen glimepiride (AMARYL) 4 MG tablet   Oral   Take 8 mg by mouth daily with breakfast.          . glucose blood test strip      Use as instructed 3x a day   200 each   3   . insulin aspart (NOVOLOG FLEXPEN) 100 UNIT/ML FlexPen   Subcutaneous   Inject 15-20 Units into the skin 3 (three) times daily with meals. Inject 15 min before meals.   15 mL   2   . insulin aspart (NOVOLOG) 100 UNIT/ML injection   Subcutaneous   Inject 15-20 Units into the skin 3 (three) times daily before meals. 15 minutes before meals.   20 mL   2   . Insulin Degludec (TRESIBA FLEXTOUCH) 200 UNIT/ML SOPN   Subcutaneous   Inject 60 Units into the skin daily. At bedtime   3 pen   2   . insulin lispro (HUMALOG KWIKPEN) 100 UNIT/ML KiwkPen   Subcutaneous   Inject 0.15-0.2 mLs (15-20 Units total) into the skin 3 (three) times daily. 15 minutes before meals.   30 mL   2   . Insulin Pen Needle 32G X 4 MM MISC      Use to inject insulin 3 times daily as instructed.   100 each   5   . insulin regular (NOVOLIN R)  100 units/mL injection      Inject 15-20 units of insulin into the skin 3 times daily. 30 minutes before meals.   20 mL   2     RELION BRAND.   Marland Kitchen Insulin Syringe-Needle U-100 (B-D INS SYRINGE 0.5CC/30GX1/2") 30G X 1/2" 0.5 ML MISC      Use to inject insulin 3 times daily as instructed.   100 each   11   . levothyroxine (SYNTHROID, LEVOTHROID) 150  MCG tablet   Oral   Take 150 mcg by mouth daily before breakfast.          . losartan (COZAAR) 100 MG tablet   Oral   Take 100 mg by mouth daily.          . Vitamin D, Ergocalciferol, (DRISDOL) 50000 UNITS CAPS capsule            1    NKDA   Family History  Problem Relation Age of Onset  . Hypertension Mother   . Thyroid disease Mother   . Hypertension Sister   . Thyroid disease Sister   . Hypertension Brother   . Hyperlipidemia Brother    PE: BP 108/66 mmHg  Pulse 76  Temp(Src) 98.2 F (36.8 C) (Oral)  Resp 12  Wt 235 lb (106.595 kg)  SpO2 98% Wt Readings from Last 3 Encounters:  12/26/14 235 lb (106.595 kg)  10/10/14 242 lb (109.77 kg)  08/22/14 244 lb (110.678 kg)   Constitutional: overweight, in NAD Eyes: PERRLA, EOMI, no exophthalmos ENT: moist mucous membranes, no neck masses palpable, no cervical lymphadenopathy Cardiovascular: RRR, No MRG Respiratory: CTA B Gastrointestinal: abdomen soft, NT, ND, BS+ Musculoskeletal: no deformities, strength intact in all 4 Skin: moist, warm, no rashes Neurological: no tremor with outstretched hands, DTR normal in all 4  ASSESSMENT: 1. DM2, insulin-dependent, uncontrolled, with complications - CKD - DR  2. 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 She did not have RAI treatment at that time, however, she was found to have a thyroid mass in 2005. This was biopsied and returned as normal thyroid  tissue. However, she had RAI treatment at that time. The posttreatment whole-body scan was negative for any cancer spread. She did not have consistent follow-up in the last 10 years  Component     Latest Ref Rng 08/22/2014  Thyroglobulin     2.8 - 40.9 ng/mL 2.3 (L)  Thyroglobulin Ab     <2 IU/mL <1  Thyroglobulin Antibody     0.0 - 0.9 IU/mL <1.0   Last U/S (09/19/2014): 1. Post total thyroidectomy. 2. No change in the previously biopsied approximately 1.4 cm echogenic solid nodule within the inferior aspect of the right lobe of the thyroid, grossly unchanged since the 2005 examination. 3. Apparent development of an approximately 0.4 cm hypoechoic nodule within the right thyroidectomy bed - while too small for definitive characterization, this nodule appears to contain an echogenic hilum and thus is favored to represent a non pathologically enlarged cervical lymph node.  3. Post surgical hypothyroidism  PLAN:  1. Patient with long-standing, uncontrolled diabetes, now on basal-bolus insulin regimen with improved control, but not many checks since last visit  Patient Instructions  Please stop Toujeo and start Tresiba U200 pen 60 units at bedtime. Continue  NovoLog 15 min before breakfast and dinner: 15 units with a regular meal 20 units with a larger meal  Please return in 1.5 month with your sugar log.   - Continue checking sugars at different times of the day - check 2-3 times a day, rotating checks - given more sugar logs  - advised for yearly eye exams she is up-to-date - check HbA1c today - Return in about 3 months (around 03/27/2015).  2. 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 Please see his HPI for further details. - I reviewed her last thyroid ultrasound >> the mass  that was seen appears to be a lymph node with a fatty hilum, which is a characteristic of benign lymph nodes. - We'll check a thyroglobulin and antithyroglobulin antibody  ([facility] assay) - However, I believe that we can consider her cured, since the cancer was subcentimeter and noninvasive  3. Postsurgical hypothyroidism - She is taking her levothyroxine correctly - We will check her TFTs today   Component     Latest Ref Rng 12/26/2014  Thyroglobulin Antibody     0.0 - 0.9 IU/mL <1.0  TSH     0.35 - 4.50 uIU/mL 2.72  Free T4     0.60 - 1.60 ng/dL 1.00  Hemoglobin A1C     4.6 - 6.5 % 10.9 (H)  Thyroglobulin by IMA     1.5 - 38.5 ng/mL 0.8 (L)   Tg still detectable, but very low. Will cont. To follow for now. TSH normal. Continue LT4 150 mcg daily. HbA1c high, but improved!

## 2014-12-26 NOTE — Patient Instructions (Signed)
Please stop at the lab.  Please continue Tresiba U200 60 units at bedtime. Continue  ReliOn insulin 30 min before breakfast and dinner: 15 units with a regular meal 20 units with a larger meal  Please return in 3 months with your sugar log.

## 2014-12-27 LAB — TGAB+THYROGLOBULIN IMA OR RIA: Thyroglobulin Antibody: <1 IU/mL (ref 0.0–0.9)

## 2014-12-27 LAB — THYROGLOBULIN BY IMA: Thyroglobulin by IMA: 0.8 ng/mL — ABNORMAL LOW (ref 1.5–38.5)

## 2014-12-30 NOTE — Discharge Summary (Signed)
PATIENT NAME:  Courtney [name], Courtney [name] MR#:  [id number] DATE OF BIRTH:  09-Mar-1956  DATE OF ADMISSION:  07/07/2012 DATE OF DISCHARGE:  07/16/2012  REASON FOR ADMISSION: MRSA right thigh abscess.   OTHER DIAGNOSES:  1. Poor control diabetes.  2. Hypertension.  3. Hypothyroidism.  4. Noncompliance.  5. Sepsis due to thigh infection, now resolved.  6. Acute kidney failure due to sepsis, now resolved.  7. Possibility of shingles which has been ruled out, no longer on acyclovir.  8. Anemia of chronic disease due to uncontrolled diabetes.  9. Hyponatremia due to decreased intravascular volume now resolved.  10. Increased white blood cell due to sepsis, now resolved.   DISPOSITION: Home.   FOLLOW-UP: Primary care physician Dr Koleen Nimrod in  two days for wound VAC change.   MEDICATIONS AT DISCHARGE:  Lisinopril 40 mg once daily. Systane ophthalmic drops twice daily. Levothyroxine 200 mcg once daily. Glipizide 20 mg twice daily with meals. acetaminophen and oxycodone p.r.n. pain. Insulin 70/30 of 15 units twice daily. Amlodipine 10 mg once daily. Bactrim DS 2 tablets 2 times daily for seven days.   MEDICATIONS to be stopped: Metformin due to acute kidney injury.   DIET:  ADA diet.   Wound VAC changed 3 times a week per primary care physician at Clinic. Follow up with. PCP in 1-2 days.  HOSPITAL COURSE: Courtney [name] is a very nice 59 year old female with history of severe abscess of the right lower extremity located at the level of the thigh. The patient was admitted on 10/26 2013 with cellulitic lesion at the level of the right thigh area on the back left buttock, the right groin. Also had some rash.   The patient did report symptoms for four days getting worse, starting to get fever and have not been compliant with medication for over 10 days or more. The patient was hyperglycemic with a temperature of 102.7, tachycardic with a pulse of 95, showing signs of sepsis/systemic inflammatory response syndrome.  The patient was admitted for treatment with antibiotics. The patient was started on vancomycin and Zosyn and surgical consult was placed.   The patient was taken to the OR by Dr. Sherri Rad for an  [name] and D with debridement of 7 cm x 4 cm area and drainage of purulent secretion.   Those purulent secretions were sent for cultures and grew out MRSA. The patient needed to be taken again to the OR following dbridements and wound VAC back placement. The wound VAC was re placed here, also on the OR and now the patient is able to be discharged and follow-up with PCP.   Dr. Ola Spurr was also consulted. The patient was seen by Dr. Ola Spurr mostly for possibility of shingles.  The shingles were not necessarily an issue for what acyclovir was stopped. The patient was told to be discharged on Bactrim for at least to  complete a 14 day regimen. If worsening, patient needs to come back with IV antibiotics.   OTHER MEDICAL ISSUES: 1. Uncontrolled diabetes. The patient was not taking medications as prescribed. At this moment her blood sugars are off the chart. Her hemoglobin A1c was not checked during this hospitalization but we started her on 70/30 insulin and needs to be adjusted as of today we are going to increase 10 more units since the patient is still  going around the upper 100s, low 200s. 2. Her blood pressure is also off control for what her Norvasc has been increased to 10 mg and her  lisinopril back to 40 mg. Her lisinopril was held due to acute kidney injury, but at this moment is safe for her to take it back again. 3. Other than that, patient is stable to be discharged.         TIME SPENT: [name] spent about 45 minutes with this discharge. Education has been given to the patient about diabetes, hypertension, how to take her medications and she understands.    ____________________________ Sudan Sink, MD rsg:ljs D: 07/16/2012 12:13:40 ET T: 07/16/2012 12:26:11  ET JOB#: [id number]  cc: [place] Sink, MD, <Dictator> Anees Vanecek America Brown MD ELECTRONICALLY SIGNED 07/16/2012 20:56

## 2014-12-30 NOTE — Op Note (Signed)
PATIENT NAME:  Courtney [name], Courtney [name] MR#:  [id number] DATE OF BIRTH:  1956/05/17  DATE OF PROCEDURE:  07/08/2012  PREOPERATIVE DIAGNOSIS: Right posterior thigh abscess.   POSTOPERATIVE DIAGNOSIS: Right posterior thigh abscess.   PROCEDURE PERFORMED: Incision and drainage right posterior thigh abscess with excisional debridement of skin and soft tissue measuring 7 x 4 cm. Placement of drain.   SURGEON: Biance Moncrief A. Marina Gravel, MD  ASSISTANT: None.   ANESTHESIA: General endotracheal.   FINDINGS: Pus.   SPECIMENS: Pus.   DESCRIPTION OF PROCEDURE: With the patient in supine position, general anesthesia was induced. She was then padded and positioned in dorsal lithotomy. Right posterior thigh was sterilely prepped and draped with ChloraPrep solution.   Timeout was observed.   The area of full thickness necrosis was excised with scalpel measuring approximately 7 x 4 cm. Specimen was submitted as pathology. A large amount of thick creamy pus was encountered, an aliquot of which was submitted for microbacteriological analysis.   Utilizing gentle finger fracture technique the abscess cavity was disrupted of all loculations. The abscess cavity appeared to track proximally into the thigh for some distance and a Penrose drain was placed between these two areas. This was accomplished with a counterincision more proximal on the posterior thigh. Half-inch Penrose drain was placed and secured at both ends with nylon suture. The wound was irrigated. It was then packed open with dry Kerlix, ABDs and tape.   Patient was then returned supine, extubated and sent to recovery room in stable and satisfactory condition by anesthesia services.   ____________________________ Jeannette How Marina Gravel, MD mab:cms D: 07/08/2012 18:33:18 ET T: 07/09/2012 08:05:39 ET JOB#: [id number]  cc: Elta Guadeloupe A. Marina Gravel, MD, <Dictator> Hortencia Conradi MD ELECTRONICALLY SIGNED 07/10/2012 7:43

## 2014-12-30 NOTE — Consult Note (Signed)
PATIENT NAME:  Courtney [name], Courtney [name] MR#:  [id number] DATE OF BIRTH:  September 11, 1956  DATE OF CONSULTATION:  07/07/2012  REFERRING PHYSICIAN:   CONSULTING PHYSICIAN:  Emie Sommerfeld A. Marina Gravel, MD  REASON FOR CONSULTATION: Right thigh abscess and cellulitis.   HISTORY: 59 year old black female with a history significant for history of diabetes, hypothyroidism, hypertension presents with 3 to 4 day history of pain, swelling and tenderness from her right thigh along with drainage. She was admitted to the medical service with sepsis and fevers. Patient had recently been traveling and reports noncompliance with her diabetes medications. She was found on admission to have hyperglycemia with sugar of 579. Patient was febrile to 102.7. Patient was admitted and started on IV vancomycin and Zosyn. She was also noted to have lesions suspicious for developing shingles and as such has been placed on airborne isolation precautions. Surgical service was asked to evaluate.   ALLERGIES: None.   PAST MEDICAL HISTORY: 1. Diabetes. 2. Hypertension. 3. Hypothyroidism.   PAST SURGICAL HISTORY:  1. Cholecystectomy. 2. Carpal tunnel surgery. 3. Left shoulder surgery. 4. Corneal transplant surgery.   SOCIAL HISTORY: The patient denies any history of smoking, alcohol or substance abuse; is employed.   FAMILY HISTORY: Significant for diabetes and heart disease.   HOME MEDICATIONS:  1. Metformin extended-release 1000 mg by mouth daily. 2. Lisinopril 40 mg by mouth daily. 3. Synthroid 200 mcg by mouth daily. 4. Glipizide 40 mg by mouth daily.   REVIEW OF SYSTEMS: Significant for pain, fever, swelling of her right thigh, drainage. Remaining ten-point review is unremarkable and described in the history of present illness.   PHYSICAL EXAMINATION:  GENERAL: The patient is alert and oriented. She has her sister who is at the bedside. The patient is awake, alert, and oriented.   VITAL SIGNS: Temperature 100.4, pulse 101, respiratory  rate 18, blood pressure 101/50, room air saturation 94%.   HEAD: Normal. Mucosa is moist. Conjunctivae are pink.   NECK: Supple. No adenopathy.   LUNGS: Clear bilaterally.   HEART: Regular rate and rhythm.   ABDOMEN: Soft and nontender.   EXTREMITIES: Extremities demonstrate large area of pronounced peau d'orange cellulitis, tenderness and epidermal necrosis of the right posterior thigh, area of cellulitis measuring at least 25 x 30 cm in size. In the center of this area is an area of epidermal necrosis draining some purulent material.   PSYCHIATRIC: Appropriate mood, judgment, and affect.   NEUROLOGIC: Grossly normal.   LABORATORY, DIAGNOSTIC, AND RADIOLOGICAL DATA: Glucose 579, repeat this morning 269. On admission creatinine 1.38, sodium 129, potassium 3.8, chloride 92, WBC 15.2, hemoglobin 12.6, platelet count 244,000. Urinalysis is negative.   IMPRESSION: Sepsis and hyperglycemia secondary to large right posterior thigh cellulitic process. [name] suspect there is an underlying abscess given its appearance.   PLAN: Patient will be taken to the Operating Room tomorrow for general anesthesia and incision and drainage of right posterior thigh abscess. [name] discussed this with her and her sister and all of her questions were answered.  ____________________________ Jeannette How Marina Gravel, MD mab:cms D: 07/07/2012 14:56:18 ET T: 07/07/2012 15:27:19 ET JOB#: [id number]  cc: Elta Guadeloupe A. Marina Gravel, MD, <Dictator> Hortencia Conradi MD ELECTRONICALLY SIGNED 07/10/2012 7:41

## 2014-12-30 NOTE — Op Note (Signed)
PATIENT NAME:  CRYSTALROSE, WASHER I MR#:  [id number] DATE OF BIRTH:  09-08-1956  DATE OF PROCEDURE:  07/11/2012  PREOPERATIVE DIAGNOSIS: Abscess of right leg.   POSTOPERATIVE DIAGNOSIS: Abscess of right leg.   PROCEDURE PERFORMED: Dressing change with wound VAC placement.  SURGEON: Rodena Goldmann, MD   ANESTHESIA: Monitored anesthetic care.   DESCRIPTION OF PROCEDURE: With the patient in the supine position, after induction of appropriate regional anesthesia, the patient's right leg was prepped with Betadine and draped in sterile towels. The previous dressing had been removed. Packing was removed. The drain was removed. There was a 7 x 5 cm ulcer that was approximately 2 cm deep with 5 cm of undermining down the right leg. White foam was placed in the undermined area and a wound VAC placed using black foam in the standard fashion. It was secured to an appropriate device with normal pressure measurements. The patient was then awakened and returned to the recovery room having tolerated the procedure well.  ____________________________ Micheline Maze, MD rle:slb D: 07/11/2012 12:43:24 ET T: 07/11/2012 12:56:34 ET JOB#: [id number]  cc: Rodena Goldmann III, MD, <Dictator> Rodena Goldmann MD ELECTRONICALLY SIGNED 07/13/2012 18:14

## 2014-12-30 NOTE — Op Note (Signed)
PATIENT NAME:  Courtney [name], Courtney [name] MR#:  [id number] DATE OF BIRTH:  08-14-1956  DATE OF PROCEDURE:  07/14/2012  PREOPERATIVE DIAGNOSIS: Right thigh abscess and ulceration.   POSTOPERATIVE DIAGNOSIS:  Right thigh abscess and ulceration.     OPERATION: Right leg wound VAC dressing change.   SURGEON:  Dia Crawford, M.D.   ANESTHESIA: Monitored anesthesia care.   OPERATIVE PROCEDURE: With the patient in the supine position after the induction of appropriate intravenous sedation, the patient was turned on the right side up position. The area was prepped with ChloraPrep and draped with sterile towels. An Ioban drape was placed over the wound. The Ioban was then incised over the ulceration. The white foam was placed in the undermined areas and in the depths of the wound and black foam placed over top of that. The black foam was secured in place with a wound VAC and then attached to the appropriate suction device. The procedure was performed without difficulty. The patient was returned to the recovery room having tolerated the procedure well. Sponge, instrument and needle counts were correct x2 in the Operating Room.   ____________________________ Rodena Goldmann III, MD rle:ap D: 07/14/2012 11:00:09 ET T: 07/14/2012 11:15:41 ET JOB#: [id number]  cc: Rodena Goldmann III, MD, <Dictator>  Rodena Goldmann MD ELECTRONICALLY SIGNED 07/18/2012 10:15

## 2014-12-30 NOTE — Consult Note (Signed)
Brief Consult Note: Diagnosis: sepsis from right posterior thigh.   Patient was seen by consultant.   Consult note dictated.   Recommend to proceed with surgery or procedure.   Discussed with Attending MD.   Comments: PLan I and D Sunday under Reading.  Electronic Signatures: Sherri Rad (MD)  (Signed 26-Oct-13 14:51)  Authored: Brief Consult Note   Last Updated: 26-Oct-13 14:51 by Sherri Rad (MD)

## 2014-12-30 NOTE — Consult Note (Signed)
Brief Consult Note: Diagnosis: MRSA abscess sp drainage, Poorly controlled IDDM.   Patient was seen by consultant.   Consult note dictated.   Recommend further assessment or treatment.   Orders entered.   Comments: 59 yo with poorly controlled IDDM with thigh abscess s.p drainage with wound vac cx with mrsa s/i IV vanco also episode ARF now improving  Rec Cont IV Vanco until d./c at dc start bactrim 2 ds bid for at least 14 day (will need to be reassessed as otpt at that time) - if worsens on orals will need IV abx check HIV can d.c airborne precautions - no active shingles lesions noted.  Electronic Signatures: Angelena Form (MD)  (Signed [phone] 14:16)  Authored: Brief Consult Note   Last Updated: 03-Nov-13 14:16 by Angelena Form (MD)

## 2014-12-30 NOTE — Consult Note (Signed)
PATIENT NAME:  ALICEMAE, ROED I MR#:  [id number] DATE OF BIRTH:  May 21, 1956  DATE OF CONSULTATION:  07/15/2012  REFERRING PHYSICIAN:  Dr. Anselm Jungling  CONSULTING PHYSICIAN:  Cheral Marker. Ola Spurr, MD  REASON FOR CONSULT: MRSA thigh abscess.   HISTORY OF PRESENT ILLNESS: This is a pleasant 59 year old with very poorly controlled diabetes who was admitted on 10/26 with cellulitis and was found to have an abscess in her thigh. She underwent I and D on 10/27 with drainage of a large amount of pus. The patient was started on vancomycin at that time and was also receiving Zosyn. Cultures returned MRSA. She has received IV antibiotics since that time. Her course was complicated by acute renal failure and she has been seen by nephrology and renal function seems to be improving.   We are consulted for assistance with further antibiotic management. The patient currently has a wound VAC in place. She says she has had no fevers or chills recently. Her wound is apparently improving with the wound VAC, which was changed yesterday by Dr. Pat Patrick.  She is eating well and getting ready to be discharged home.   There was also some concern the patient had shingles on admission; however, this was not confirmed.   PAST MEDICAL HISTORY:  1. Poorly controlled diabetes.  2. Hypertension.  3. Hypothyroidism.   PAST SURGICAL HISTORY:  1. Corneal transplant.  2. Gallbladder surgery.  3. Carpal tunnel surgery.  4. Left shoulder surgery.   SOCIAL HISTORY: The patient works for [employer] and travels a lot for her work with them. She denies any smoking, alcohol, or substance abuse.   FAMILY HISTORY: Significant for diabetes and heart disease.   ALLERGIES: She has no known drug allergies.   CURRENT MEDICATIONS: 1. Vancomycin.  2. Lisinopril.  3. Bisacodyl.  4. Amlodipine.  5. Sliding scale insulin.  6. Milk of Magnesia.  7. MiraLAX.  8. Novolin insulin.  9. Morphine.  10. Glipizide.  11. Heparin.  12. Percocet.   13. Odansetron.   14. Acyclovir.  15. Levothyroxine.   REVIEW OF SYSTEMS: 11 systems reviewed and negative except as per history of present illness.   PHYSICAL EXAMINATION:  GENERAL: The patient is overweight, sitting in a chair in no acute distress.   VITAL SIGNS: Temperature over the last 24 hours max is 98.5. Pulse is 78, blood pressure 154/80, sat 100% on room air.   HEENT: Pupils equal, round, and reactive to light and accommodation. Extraocular movements are intact. Sclerae anicteric. Oropharynx clear.   NECK: Supple.   HEART: Regular.   LUNGS: Clear.   ABDOMEN: Soft, nontender, nondistended. No hepatosplenomegaly.   EXTREMITIES: She has 2+ right lower extremity edema.   SKIN: She has a large wound on her posterior thigh, which is covered with a wound VAC. There is no tenderness to the area. There is drainage in the wound VAC. There is surrounding induration and some edema but only minimal warmth.  NEURO:  She is alert and oriented times three. Grossly nonfocal neuro exam.   LABORATORY DATA:  Most recent BUN/creatinine is 9/1.44. This creatinine is down from a level of about 2.5.  Her sugars remain elevated. Most recent white blood count on 10/30 was down to 8.  It was 20.8 on admission with 18.3 neutrophils.   CULTURE DATA: She has cultures from her wound growing MRSA sensitive to Bactrim and clindamycin. It is resistant to Cipro and erythromycin, intermediate to levofloxacin. Path report shows necrotic tissue and abscess formation.  IMPRESSION: This is a 59 year old with poorly controlled diabetes admitted with MRSA abscess. Some question as to whether she has shingles as well. Course complicated by acute renal failure now resolving. She has been on IV antibiotics and has clinical improvement. She remains with a wound VAC.   RECOMMENDATIONS:  1. I would continue IV vancomycin until discharge. At that time I would put her on Bactrim two double strength tablets twice a day  for at least 14 days. This high dose may be necessary to get penetration into the inflamed tissue. She should continue with the wound VAC and surgical management as per surgery.  2. We should follow her renal function at least once a week on the Bactrim.  3. We will check an HIV test today.  4. I can follow the patient as an outpatient if needed, but the main key will be controlling her diabetes as this likely predisposes her to infection. Thank you for the consult. We will be glad to follow with you. ____________________________ Cheral Marker. Ola Spurr, MD dpf:bjt D:  07/15/2012 14:25:14 ET          T: 07/16/2012 09:30:21 ET         JOB#: [id number]  cc: Cheral Marker. Ola Spurr, MD, <Dictator>  Clariza Sickman Ola Spurr MD ELECTRONICALLY SIGNED 07/19/2012 18:50

## 2014-12-30 NOTE — H&P (Signed)
PATIENT NAME:  Courtney [name], Courtney [name] MR#:  [id number] DATE OF BIRTH:  March 18, 1956  DATE OF ADMISSION:  07/07/2012  REFERRING PHYSICIAN: Dr. Liana Gerold PRIMARY CARE PHYSICIAN: Dr. Daiva Eves at [facility]: Right thigh cellulitis.   HISTORY OF PRESENT ILLNESS: This is a 59 year old female with significant past medical history of diabetes mellitus, hypothyroidism, hypertension, patient presents with skin rash and lesion and cellulitis and pain and tenderness in the right thigh area, as well in the right groin and left buttocks area as well. Patient reports these symptoms started 3 to 4 days ago. She was complaining of fever and chills, she had some drainage in the right thigh wound as well. Patient reports she has been noncompliant with her medication where she ran out at least for 10 days of her medications where she has not been taking them reports because she has been busy and has not had time to go see her PCP and refill her medication. Patient was found to have hyperglycemia with initial glucose and her BMP was 579. As well patient was afebrile upon presentation with temperature of 102.7. Patient had blood cultures sent and received IV vancomycin and Zosyn in Emergency Department. Patient as well was complaining of some skin lesions in the left upper back area which were tender. Patient was noticed to have two lesions on the physical exam resembling shingles. She reports these are more tender and they started today   PAST MEDICAL HISTORY:  1. Diabetes mellitus.  2. Hypertension.  3. Hypothyroidism.   PAST SURGICAL HISTORY:  1. Corneal transplant.  2. Gallbladder surgery.  3. Carpal tunnel surgery.  4. Left shoulder surgery.   SOCIAL HISTORY: Patient denies any history of smoking, alcohol or substance abuse. 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   FAMILY HISTORY: Significant for diabetes and heart disease in the family.   ALLERGIES: No known drug  allergies.   HOME MEDICATIONS: As reported initially patient has not been taking her medication for the last 10 days but she is usually on:  1. Metformin extended release 1000 mg oral daily.  2. Lisinopril 40 mg oral daily.  3. Levothyroxine 200 mcg oral daily.  4. Glipizide 40 mg oral daily.   REVIEW OF SYSTEMS: CONSTITUTIONAL: Patient complains of fever, fatigue and weakness. EYES: Denies blurry vision, double vision, or pain. ENT: Denies tinnitus, ear pain, hearing loss. RESPIRATORY: Denies cough, wheezing, hemoptysis, dyspnea. CARDIOVASCULAR: Denies chest pain, orthopnea, edema, arrhythmia. GASTROINTESTINAL: Denies nausea, vomiting, diarrhea, abdominal pain, hematemesis. GENITOURINARY: Denies dysuria, hematuria, renal colic. ENDO: Denies polyuria, polydipsia, heat or cold intolerance. HEMATOLOGY: Denies anemia, easy bruising, bleeding diathesis. INTEGUMENTARY: Denies any acne. Has multiple skin cellulitis lesions. MUSCULOSKELETAL: Denies neck pain, shoulder pain, knee pain, swelling, gout. NEUROLOGIC: Denies numbness, dysarthria, epilepsy, tremors, vertigo. PSYCHIATRIC: Denies anxiety, schizophrenia, nervousness, insomnia, alcohol or substance abuse.   PHYSICAL EXAMINATION:  VITAL SIGNS: Temperature 102.7, pulse 95, respiratory rate 18, blood pressure 135/65, saturating 99% on room air.   GENERAL: Obese female looks comfortable in bed in no apparent distress.   HEENT: Head atraumatic, normocephalic. Pupils equal, reactive to light. Pink conjunctivae. Anicteric sclerae. Moist oral mucosa.   NECK: Supple. No thyromegaly. No JVD.   CHEST: Good air entry bilaterally. No wheezing, rales, rhonchi.   CARDIOVASCULAR: S1, S2 heard. No rubs, murmur, gallops.   ABDOMEN: Soft, nontender, nondistended. Bowel sounds present.   EXTREMITIES: No edema. No clubbing. No cyanosis.   SKIN: Has multiple cellulitic lesions,  most pronounced in the posterior right thigh area with tenderness and erythema with  induration as well as small cellulitic lesion in right groin in the left buttock area and had furuncle in the left cheek area. As well patient had two vesicular lesions with fluid in the left back area.   PSYCHIATRIC: Appropriate affect. Awake, alert x3. Intact judgment and insight.   NEUROLOGICAL: Cranial nerves grossly intact. Motor 5/5 in all extremities. Deep tendon reflexes symmetrical and intact.   LABORATORY, DIAGNOSTIC AND RADIOLOGICAL DATA: Glucose 579, BUN 12, creatinine 1.38, sodium 129, potassium 3.8, chloride 292, CO2 23, anion gap 14, white blood cells 15.2, hemoglobin 12.3, hematocrit 37.6, platelets 244.   ASSESSMENT AND PLAN:  1. Multiple skin infections sites, mainly in the right posterior thigh cellulitis and abscess, with multiple cellulitic lesions in the skin mainly in the right groin, left buttock. Patient will be started on IV vancomycin and Zosyn, given she has sepsis from these infections blood cultures were sent. being on IV fluid resuscitation. Will consult surgical service in a.m. for possible need of incision and drainage of her right thigh lesion.  2. Shingles. Patient will be started on p.o. acyclovir,  and droplet precautions.  3. Diabetes mellitus, uncontrolled with hyperglycemia. Patient will be resumed on metformin, will be started on Levemir and insulin sliding scale.  4. Hypertension. Will continue patient on lisinopril.  5. Renal insufficiency, unclear if acute or chronic. This is most likely due to volume depletion and sepsis. Will continue with fluids.  6. Hyponatremia most likely due to volume depletion from sepsis. Will continue with fluid resuscitation.  7. Hypothyroidism. Continue with Synthroid.  8. Medication noncompliance. Patient was counseled.  9. Deep vein thrombosis prophylaxis. Sub-Q heparin.  10. CODE STATUS: FULL CODE.     TOTAL TIME SPENT ON ADMISSION AND PATIENT CARE: 55 minutes.   ____________________________ Albertine Patricia,  MD dse:cms D: 07/07/2012 04:25:39 ET T: 07/07/2012 09:25:38 ET JOB#: [id number]  cc: Albertine Patricia, MD, <Dictator> Maura L. Hamrick, MD  Daveigh Batty Graciela Husbands MD ELECTRONICALLY SIGNED 07/13/2012 2:19

## 2015-03-13 LAB — HM DIABETES EYE EXAM

## 2015-03-27 ENCOUNTER — Other Ambulatory Visit (INDEPENDENT_AMBULATORY_CARE_PROVIDER_SITE_OTHER): Payer: BLUE CROSS/BLUE SHIELD | Admitting: *Deleted

## 2015-03-27 ENCOUNTER — Encounter: Payer: Self-pay | Admitting: Internal Medicine

## 2015-03-27 ENCOUNTER — Ambulatory Visit (INDEPENDENT_AMBULATORY_CARE_PROVIDER_SITE_OTHER): Payer: BLUE CROSS/BLUE SHIELD | Admitting: Internal Medicine

## 2015-03-27 VITALS — BP 114/68 | HR 83 | Temp 98.3°F | Resp 12 | Wt 237.0 lb

## 2015-03-27 DIAGNOSIS — E1129 Type 2 diabetes mellitus with other diabetic kidney complication: Secondary | ICD-10-CM

## 2015-03-27 DIAGNOSIS — E890 Postprocedural hypothyroidism: Secondary | ICD-10-CM | POA: Diagnosis not present

## 2015-03-27 DIAGNOSIS — E1165 Type 2 diabetes mellitus with hyperglycemia: Secondary | ICD-10-CM | POA: Diagnosis not present

## 2015-03-27 DIAGNOSIS — IMO0002 Reserved for concepts with insufficient information to code with codable children: Secondary | ICD-10-CM

## 2015-03-27 DIAGNOSIS — E1121 Type 2 diabetes mellitus with diabetic nephropathy: Secondary | ICD-10-CM

## 2015-03-27 DIAGNOSIS — C73 Malignant neoplasm of thyroid gland: Secondary | ICD-10-CM | POA: Diagnosis not present

## 2015-03-27 LAB — POCT GLYCOSYLATED HEMOGLOBIN (HGB A1C): HEMOGLOBIN A1C: 11

## 2015-03-27 MED ORDER — INSULIN PEN NEEDLE 32G X 4 MM MISC: Status: DC

## 2015-03-27 MED ORDER — INSULIN DEGLUDEC 200 UNIT/ML ~~LOC~~ SOPN: 70.0000 [IU] | PEN_INJECTOR | Freq: Every day | SUBCUTANEOUS | Status: DC

## 2015-03-27 MED ORDER — INSULIN REGULAR HUMAN 100 UNIT/ML IJ SOLN: INTRAMUSCULAR | Status: DC

## 2015-03-27 MED ORDER — METFORMIN HCL 500 MG PO TABS: 500.0000 mg | ORAL_TABLET | Freq: Two times a day (BID) | ORAL | Status: DC

## 2015-03-27 NOTE — Patient Instructions (Signed)
Please restart: - Metformin 500 mg 2x a day.  Please increase: - Tresiba U200 to 70 units at bedtime or in am - ReliOn Novolin 2x a day, 30 min before breakfast and dinner:  20 units with a regular meal  25 units with a larger meal If sugars before dinner are still high, may need 15 units with lunch.  Please come back for a follow-up appointment in 1.5 months with your sugar log.

## 2015-03-27 NOTE — Progress Notes (Signed)
Patient ID: Courtney [name], female   DOB: 1955/11/27, 59 y.o.   MRN: [id number]  HPI: Courtney [name] is a 59 y.o.-year-old female, initially referred by her PCP, Dr. Daiva Eves, for management of DM2, dx in ~2000, insulin-dependent since 2011, uncontrolled, with complications (CKD - sees nephrology, DR) also postsurgical hypothyroidism for Thyroid cancer. Last visit 3 months ago.  She developed golfer's elbow - R arm.  DM2: Last hemoglobin A1c was: Lab Results  Component Value Date   HGBA1C 10.9* 12/26/2014  08/01/2014: HbA1c 14.5% 05/02/2014: HbA1c 14.8% 01/31/2014: HbA1c 13.6% 11/01/2013: HbA1c 15.1%  Pt was on a regimen of: - Novolin 70/30 48 units 2x a day 15 min before the meals - but may forget - misses 5/14 doses a week - Amaryl 8 mg in am She was on Metformin >> stopped 2/2 CKD.  At last visit, we switched to: - Toujeo >> Tresiba U200 60 units at bedtime or in am - ReliOn Novolin 2x a day (skips lunch - joint and mm pain): 15 units with a regular meal 20 units with a larger meal  She is checking 3x a day - no log, no meter: - am: 308-333, before: 150s >> 76-150, 160 >> 135-140 >> 233-146, 299  - 2h after b'fast: n/c - before lunch: n/c >> 72-149 >> n/c - 2h after lunch: n/c >> 67, 228 >> n/c - before dinner: n/c >> 65-37, 190-207 >> 135-140 >> 240s - 2h after dinner: n/c >> 131, 269 >> n/c - bedtime: n/c >> 81-160 >> n/c >> 240s - nighttime: n/c No lows. Lowest sugar was 98 >> 233; ? hypoglycemia awareness Highest sugar was 140s >> 299  She goes to work at 8 pm, but still works some days.   Glucometer: Prodigy  Pt's meals are - she started to make changes - but still skips: - Breakfast: varies - mostly eats out - may skip. Oatmeal, sausage or bacon, apples - Lunch: peanuts, sandwich + crystal lite drink - Dinner: frozen dinner - Snacks: fruit, small bag potato chips, peanuts Drinks Cool Aid instead of sodas now >> started to drink non-sweet drinks  - +  stage 3 CKD, last BUN/creatinine:  08/01/2014: 15/1.26, GFR 55 - last set of lipids: 08/01/2014: 145/78/74/55 - last eye exam was in 03/13/2015 >> DR in R eye, had Laser Sx, also getting intraocular injections; L eye cataract >> had cataract sx 02/25/2015 - no numbness and tingling in her feet.  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 She did not have RAI treatment at that time, however, she was found to have a thyroid mass in 2005. This was biopsied and returned as normal thyroid tissue. However, she had RAI treatment at that time. The posttreatment whole-body scan was negative for any cancer spread. She did not have consistent follow-up in the last 10 years  No neck compression symptoms.  A previous thyroglobulin returned at 2.3. At that time, I ordered a neck ultrasound that showed a stable nodule with a fatty hilum, consistent with a benign lymph node, but no other masses in the surgical bed.   Component     Latest Ref Rng 08/22/2014 12/26/2014  Thyroglobulin     2.8 - 40.9 ng/mL 2.3 (L)   Thyroglobulin Ab     <2 IU/mL <1  Thyroglobulin Antibody     0.0 - 0.9 IU/mL <1.0 <1.0  Thyroglobulin by IMA     1.5 - 38.5 ng/mL  0.8 (L)    Postsurgical hypothyroidism: Last TSH:  Lab Results  Component Value Date   TSH 2.72 12/26/2014   FREET4 1.00 12/26/2014  08/01/2014: TSH 1.89  She is on LT4 150 mcg daily: - in am, before her dinner - with water - eats b'fast 30 min later - No calcium, iron, PPI, MVI  Started vit D 50,000 IU weekly.  ROS: Constitutional: no weight gain/loss, no fatigue, no subjective hypothermia Eyes: no blurry vision, no xerophthalmia ENT: no sore throat, see history of present illness, no hoarseness Cardiovascular: no CP/SOB/palpitations/+ leg  swelling Respiratory: no cough/SOB Gastrointestinal: no N/V/D/C Musculoskeletal: no muscle/joint aches Skin: no rashes, + itching Neurological: no tremors/numbness/tingling/dizziness Psychiatric: no depression/anxiety  I reviewed pt's medications, allergies, PMH, social hx, family hx, and changes were documented in the history of present illness. Otherwise, unchanged from my initial visit note.  Past Medical History  Diagnosis Date  . Diabetes mellitus without complication   . Hypertension   . Thyroid disease   . Chronic kidney disease   . Anemia   . MRSA infection 2013    on leg; surgery    Past Surgical History  Procedure Laterality Date  . 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 Cholecystectomy    . Trigger finger release  2003  . Bilateral carpal tunnel release Bilateral 2003  . Shoulder arthroscopy  2004  . Thyroid surgery  2004   History   Social History Main Topics  . Smoking status: Former Research scientist (life sciences)  . Smokeless tobacco: Not on file  . Alcohol Use: No  . Drug Use: No   Social History Narrative   Single   0 children   [place] (travels regularly)      Beer and wine on occasion   First menstrual cycle: 6th grade   Postmenopausal      Current Outpatient Rx  Name  Route  Sig  Dispense  Refill  . amLODipine (NORVASC) 10 MG tablet   Oral   Take 10 mg by mouth daily.          . diphenhydrAMINE (BENADRYL ALLERGY) 25 MG tablet   Oral   Take 25 mg by mouth as needed.          Marland Kitchen glucose blood test strip      Use as instructed 3x a day   200 each   3   . Insulin Degludec (TRESIBA FLEXTOUCH) 200 UNIT/ML SOPN   Subcutaneous   Inject 60 Units into the skin daily. At bedtime   3 pen   2   . Insulin Syringe-Needle U-100 (B-D INS SYRINGE 0.5CC/30GX1/2") 30G X 1/2" 0.5 ML MISC      Use to inject insulin 3 times daily as instructed.   100 each   11   . levothyroxine (SYNTHROID, LEVOTHROID) 150 MCG tablet   Oral   Take 150 mcg by mouth daily before  breakfast.          . losartan (COZAAR) 100 MG tablet   Oral   Take 100 mg by mouth daily.          Marland Kitchen NOVOLOG 100 UNIT/ML injection   Subcutaneous   Inject 15-20 Units into the skin 2 (two) times daily before a meal.       0     Dispense as written.   Marland Kitchen  Vitamin D, Ergocalciferol, (DRISDOL) 50000 UNITS CAPS capsule            1   . insulin regular (NOVOLIN R) 100 units/mL injection      Inject 15-20 units of insulin into the skin 3 times daily. 30 minutes before meals. Patient not taking: Reported on 03/27/2015   20 mL   2     RELION BRAND.    NKDA   Family History  Problem Relation Age of Onset  . Hypertension Mother   . Thyroid disease Mother   . Hypertension Sister   . Thyroid disease Sister   . Hypertension Brother   . Hyperlipidemia Brother    PE: BP 114/68 mmHg  Pulse 83  Temp(Src) 98.3 F (36.8 C) (Oral)  Resp 12  Wt 237 lb (107.502 kg)  SpO2 97% Wt Readings from Last 3 Encounters:  03/27/15 237 lb (107.502 kg)  12/26/14 235 lb (106.595 kg)  10/10/14 242 lb (109.77 kg)   Constitutional: overweight, in NAD Eyes: PERRLA, EOMI, no exophthalmos ENT: moist mucous membranes, no neck masses palpable, no cervical lymphadenopathy Cardiovascular: RRR, No MRG Respiratory: CTA B Gastrointestinal: abdomen soft, NT, ND, BS+ Musculoskeletal: no deformities, strength intact in all 4 Skin: moist, warm, no rashes Neurological: no tremor with outstretched hands, DTR normal in all 4  ASSESSMENT: 1. DM2, insulin-dependent, uncontrolled, with complications - CKD - DR  2. 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 She did not have RAI treatment at that time, however, she was found to have a thyroid mass in 2005. This was biopsied and returned as normal thyroid tissue. However, she had RAI treatment  at that time. The posttreatment whole-body scan was negative for any cancer spread.   Component     Latest Ref Rng 08/22/2014  Thyroglobulin     2.8 - 40.9 ng/mL 2.3 (L)  Thyroglobulin Ab     <2 IU/mL <1  Thyroglobulin Antibody     0.0 - 0.9 IU/mL <1.0   Last U/S (09/19/2014): 1. Post total thyroidectomy. 2. No change in the previously biopsied approximately 1.4 cm echogenic solid nodule within the inferior aspect of the right lobe of the thyroid, grossly unchanged since the 2005 examination. 3. Apparent development of an approximately 0.4 cm hypoechoic nodule within the right thyroidectomy bed - while too small for definitive characterization, this nodule appears to contain an echogenic hilum and thus is favored to represent a non pathologically enlarged cervical lymph node.  3. Post surgical hypothyroidism  PLAN:  1. Patient with long-standing, uncontrolled diabetes, now on basal-bolus insulin regimen with initially improved control after changing to [place], now with very high sugars again! She states compliance. Will increase insulin doses and add low dose of Metformin. Metformin use has now been approved by FDA based on GFR, rather than creatinine - no restrictions for GFR >45.   Patient Instructions  Please restart: - Metformin 500 mg 2x a day.  Please increase: - Tresiba U200 to 70 units at bedtime or in am - ReliOn Novolin 2x a day, 30 min before breakfast and dinner:  20 units with a regular meal  25 units with a larger meal If sugars before dinner are still high, may need 15 units with lunch.  Please come back for a follow-up appointment in 1.5 months with your sugar log.  - Continue checking  sugars at different times of the day - check 3 times a day, rotating checks - given more sugar logs  - advised for yearly eye exams she is up-to-date - check HbA1c today >> 11% (high, stable) - Return in about 6 weeks (around 05/08/2015).  2. Thyroid cancer, in  remission - Subcentimeter follicular variant of papillary thyroid cancer, multifocal, likely completely cured after her thyroidectomy, especially since she also had to have radioactive iodine treatment a year later (in 2005). Please see his HPI for further details. - I reviewed her last thyroid ultrasound >> the mass that was seen appears to be a lymph node with a fatty hilum, which is a characteristic of benign lymph nodes. - reviewed Tg (improved) - I believe that we can consider her cured, since the cancer was subcentimeter and noninvasive  3. Postsurgical hypothyroidism - She is taking her levothyroxine correctly - We reviewed last set of her TFTs from 12/2014 >> normal - Continue LT4 150 mcg daily.

## 2015-05-08 ENCOUNTER — Ambulatory Visit
Admission: RE | Admit: 2015-05-08 | Discharge: 2015-05-08 | Disposition: A | Discharge status: Home / Self Care | Payer: Worker's Compensation | Source: Ambulatory Visit | Attending: Family Medicine | Admitting: Family Medicine

## 2015-05-08 ENCOUNTER — Other Ambulatory Visit: Payer: Self-pay | Admitting: Family Medicine

## 2015-05-08 DIAGNOSIS — M25562 Pain in left knee: Secondary | ICD-10-CM | POA: Insufficient documentation

## 2015-05-15 ENCOUNTER — Ambulatory Visit: Payer: BLUE CROSS/BLUE SHIELD | Admitting: Internal Medicine

## 2015-06-19 ENCOUNTER — Encounter: Payer: Self-pay | Admitting: Internal Medicine

## 2015-06-19 ENCOUNTER — Ambulatory Visit (INDEPENDENT_AMBULATORY_CARE_PROVIDER_SITE_OTHER): Payer: BLUE CROSS/BLUE SHIELD | Admitting: Internal Medicine

## 2015-06-19 ENCOUNTER — Other Ambulatory Visit (INDEPENDENT_AMBULATORY_CARE_PROVIDER_SITE_OTHER): Payer: BLUE CROSS/BLUE SHIELD | Admitting: *Deleted

## 2015-06-19 VITALS — BP 118/62 | HR 87 | Temp 97.9°F | Resp 12 | Wt 244.0 lb

## 2015-06-19 DIAGNOSIS — E1165 Type 2 diabetes mellitus with hyperglycemia: Secondary | ICD-10-CM

## 2015-06-19 DIAGNOSIS — E1121 Type 2 diabetes mellitus with diabetic nephropathy: Secondary | ICD-10-CM

## 2015-06-19 DIAGNOSIS — C73 Malignant neoplasm of thyroid gland: Secondary | ICD-10-CM | POA: Diagnosis not present

## 2015-06-19 DIAGNOSIS — IMO0002 Reserved for concepts with insufficient information to code with codable children: Secondary | ICD-10-CM

## 2015-06-19 DIAGNOSIS — E890 Postprocedural hypothyroidism: Secondary | ICD-10-CM

## 2015-06-19 LAB — POCT GLYCOSYLATED HEMOGLOBIN (HGB A1C): Hemoglobin A1C: 9.9

## 2015-06-19 NOTE — Patient Instructions (Signed)
Please continue: - Metformin 500 mg 2x a day. - Tyler Aas U200 70 units at bedtime or in am - ReliOn Novolin 2x a day, 30 min before breakfast and dinner:  20 units with a regular meal  25 units with a larger meal If sugars before dinner are still high, may need 15 units with lunch.  Please come back for a follow-up appointment in 3 months with your sugar log.

## 2015-06-19 NOTE — Progress Notes (Signed)
Patient ID: Courtney [name], female   DOB: [birth year], 59 y.o.   MRN: [id number]  HPI: Courtney [name] is a 59 y.o.-year-old female, initially referred by her PCP, Dr. Daiva Eves, for management of DM2, dx in ~2000, insulin-dependent since 2011, uncontrolled, with complications (CKD - sees nephrology, DR) also postsurgical hypothyroidism for Thyroid cancer. Last visit 3 months ago.  She fell >> sprained medial ligament L knee >> needs to keep pressure off leg >> sleeping most of the day >> forgetting insulin doses.  DM2: Last hemoglobin A1c was: Lab Results  Component Value Date   HGBA1C 11.0 03/27/2015   HGBA1C 10.9* 12/26/2014  08/01/2014: HbA1c 14.5% 05/02/2014: HbA1c 14.8% 01/31/2014: HbA1c 13.6% 11/01/2013: HbA1c 15.1%  Pt was on a regimen of: - Novolin 70/30 48 units 2x a day 15 min before the meals - but may forget - misses 5/14 doses a week - Amaryl 8 mg in am She was on Metformin >> stopped 2/2 CKD.  At last visit, we switched to: - Metformin 500 mg twice a day - Tresiba U200 70 units at bedtime or in am  - tries not to miss any doses - ReliOn Novolin 2x a day, 30 min before breakfast and dinner - misses doses  20 units with a regular meal  25 units with a larger meal If sugars before dinner are still high, may need 15 units with lunch.  She is checking 3x a day - no log, no meter, again: - am: 308-333, before: 150s >> 76-150, 160 >> 135-140 >> 233-246, 299 >> 145-228  - 2h after b'fast: n/c - before lunch: n/c >> 72-149 >> n/c - 2h after lunch: n/c >> 67, 228 >> n/c - before dinner: n/c >> 65-37, 190-207 >> 135-140 >> 240s >> n/c - 2h after dinner: n/c >> 131, 269 >> n/c - bedtime: n/c >> 81-160 >> n/c >> 240s >> n/c - nighttime: n/c No lows. Lowest sugar was 98 >> 233 >> 138; ? hypoglycemia awareness Highest sugar was 140s >> 299 >> 200s  She goes to work at 8 pm, but still works some days.   Glucometer: Prodigy  Pt's meals are - she started to make  changes: - Breakfast: varies - mostly eats out - may skip. Oatmeal, sausage or bacon, apples - Lunch: peanuts, sandwich + crystal lite drink - Dinner: frozen dinner - Snacks: fruit, small bag potato chips, peanuts Drinks Cool Aid instead of sodas now >> started to drink non-sweet drinks  - + stage 3 CKD, last BUN/creatinine:  08/01/2014: 15/1.26, GFR 55 - last set of lipids: 08/01/2014: 145/78/74/55 - last eye exam was in 03/13/2015 >> DR in R eye, had Laser Sx, also getting intraocular injections - last 05/2015; L eye cataract >> had cataract sx 02/25/2015.  - no numbness and tingling in her feet.  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 She did not have RAI treatment at that time, however, she was found to have a thyroid mass in 2005. This was biopsied and returned as normal thyroid tissue. However, she had RAI treatment at that time. The posttreatment whole-body scan was negative for any cancer spread. She did not have consistent follow-up in the last 10 years  No neck compression symptoms.  A thyroglobulin returned at 2.3 in 08/2014. At that time, I  ordered a neck ultrasound that showed a stable nodule with a fatty hilum, consistent with a benign lymph node, but no other masses in the surgical bed.   Component     Latest Ref Rng 08/22/2014 12/26/2014  Thyroglobulin     2.8 - 40.9 ng/mL 2.3 (L)   Thyroglobulin Ab     <2 IU/mL <1   Thyroglobulin Antibody     0.0 - 0.9 IU/mL <1.0 <1.0  Thyroglobulin by IMA     1.5 - 38.5 ng/mL  0.8 (L)   No neck compression sxs.  Postsurgical hypothyroidism: Last TSH:  Lab Results  Component Value Date   TSH 2.72 12/26/2014   FREET4 1.00 12/26/2014  08/01/2014: TSH 1.89  She is on LT4 150 mcg daily: - in am, before her dinner - with water - eats b'fast 30  min later - No calcium, iron, PPI, MVI  ROS: Constitutional: no weight gain/loss, no fatigue, no subjective hypothermia Eyes: no blurry vision, no xerophthalmia ENT: no sore throat, see history of present illness, no hoarseness Cardiovascular: no CP/SOB/palpitations/+ leg swelling Respiratory: no cough/SOB Gastrointestinal: no N/V/D/C Musculoskeletal: no muscle/joint aches Skin: no rashes Neurological: no tremors/numbness/tingling/dizziness Psychiatric: no depression/anxiety  I reviewed pt's medications, allergies, PMH, social hx, family hx, and changes were documented in the history of present illness. Otherwise, unchanged from my initial visit note.  Past Medical History  Diagnosis Date  . Diabetes mellitus without complication   . Hypertension   . Thyroid disease   . Chronic kidney disease   . Anemia   . MRSA infection 2013    on leg; surgery    Past Surgical History  Procedure Laterality Date  . 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 Cholecystectomy    . Trigger finger release  2003  . Bilateral carpal tunnel release Bilateral 2003  . Shoulder arthroscopy  2004  . Thyroid surgery  2004   History   Social History Main Topics  . Smoking status: Former Research scientist (life sciences)  . Smokeless tobacco: Not on file  . Alcohol Use: No  . Drug Use: No   Social History Narrative   Single   0 children   Copy (travels regularly)      Beer and wine on occasion   First menstrual cycle: 6th grade   Postmenopausal      Current Outpatient Rx  Name  Route  Sig  Dispense  Refill  . amLODipine (NORVASC) 10 MG tablet   Oral   Take 10 mg by mouth daily.          . diphenhydrAMINE (BENADRYL ALLERGY) 25 MG tablet   Oral   Take 25 mg by mouth as needed.          Marland Kitchen glucose blood test strip      Use as instructed 3x a day   200 each   3   . Insulin Degludec (TRESIBA FLEXTOUCH) 200 UNIT/ML SOPN   Subcutaneous   Inject 70 Units into the skin daily. At bedtime   3 pen   2    . Insulin Pen Needle (CLICKFINE PEN NEEDLES) 32G X 4 MM MISC      Use 1x a day   100 each   2   . insulin regular (NOVOLIN R) 100 units/mL injection      Inject 20-25 units of insulin into the skin 2 times daily. 30 minutes before meals.   20 mL   2     RELION BRAND.   Marland Kitchen  Insulin Syringe-Needle U-100 (B-D INS SYRINGE 0.5CC/30GX1/2") 30G X 1/2" 0.5 ML MISC      Use to inject insulin 3 times daily as instructed.   100 each   11   . levothyroxine (SYNTHROID, LEVOTHROID) 150 MCG tablet   Oral   Take 150 mcg by mouth daily before breakfast.          . losartan (COZAAR) 100 MG tablet   Oral   Take 100 mg by mouth daily.          . metFORMIN (GLUCOPHAGE) 500 MG tablet   Oral   Take 1 tablet (500 mg total) by mouth 2 (two) times daily with a meal.   60 tablet   5   . NOVOLOG 100 UNIT/ML injection   Subcutaneous   Inject 15-20 Units into the skin 2 (two) times daily before a meal.       0     Dispense as written.   . Vitamin D, Ergocalciferol, (DRISDOL) 50000 UNITS CAPS capsule            1    NKDA   Family History  Problem Relation Age of Onset  . Hypertension Mother   . Thyroid disease Mother   . Hypertension Sister   . Thyroid disease Sister   . Hypertension Brother   . Hyperlipidemia Brother    PE: BP 118/62 mmHg  Pulse 87  Temp(Src) 97.9 F (36.6 C) (Oral)  Resp 12  Wt 244 lb (110.678 kg)  SpO2 98% Body mass index is 37.63 kg/(m^2). Wt Readings from Last 3 Encounters:  06/19/15 244 lb (110.678 kg)  03/27/15 237 lb (107.502 kg)  12/26/14 235 lb (106.595 kg)   Constitutional: overweight, in NAD Eyes: PERRLA, EOMI, no exophthalmos ENT: moist mucous membranes, no neck masses palpable, no cervical lymphadenopathy Cardiovascular: RRR, No MRG Respiratory: CTA B Gastrointestinal: abdomen soft, NT, ND, BS+ Musculoskeletal: no deformities, strength intact in all 4 Skin: moist, warm, no rashes Neurological: no tremor with outstretched hands, DTR  normal in all 4  ASSESSMENT: 1. DM2, insulin-dependent, uncontrolled, with complications - CKD - DR  2. 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 She did not have RAI treatment at that time, however, she was found to have a thyroid mass in 2005. This was biopsied and returned as normal thyroid tissue. However, she had RAI treatment at that time. The posttreatment whole-body scan was negative for any cancer spread.   Component     Latest Ref Rng 08/22/2014  Thyroglobulin     2.8 - 40.9 ng/mL 2.3 (L)  Thyroglobulin Ab     <2 IU/mL <1  Thyroglobulin Antibody     0.0 - 0.9 IU/mL <1.0   Last U/S (09/19/2014): 1. Post total thyroidectomy. 2. No change in the previously biopsied approximately 1.4 cm echogenic solid nodule within the inferior aspect of the right lobe of the thyroid, grossly unchanged since the 2005 examination. 3. Apparent development of an approximately 0.4 cm hypoechoic nodule within the right thyroidectomy bed - while too small for definitive characterization, this nodule appears to contain an echogenic hilum and thus is favored to represent a non pathologically enlarged cervical lymph node.  3. Post surgical hypothyroidism  PLAN:  1. Patient with long-standing, uncontrolled diabetes, now on basal-bolus insulin regimen with Tyler Aas and Relion insulin R, and added Metformin at  last visit (500 mg bid) now Metformin use has now been approved by FDA based on GFR, rather than creatinine - no restrictions for GFR >45. She does not bring a CBG log or meter and mentions she only checks sugars in am. She forgers short acting insulin doses. We checked HbA1c today >> 9.9% (high, but better). Will continue current insulin doses + metformin >> advised her to be consistent with her insulin doses.  Patient Instructions   Please continue: - Metformin 500 mg 2x a day. - Tyler Aas U200 70 units at bedtime or in am - ReliOn Novolin 2x a day, 30 min before breakfast and dinner:  20 units with a regular meal  25 units with a larger meal If sugars before dinner are still high, may need 15 units with lunch.  Please come back for a follow-up appointment in 3 months with your sugar log.  - Continue checking sugars at different times of the day - check 3 times a day, rotating checks - refuses flu vaccine today - advised for yearly eye exams she is up-to-date - Return in about 3 months (around 09/19/2015).  2. 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 On her last thyroid ultrasound >> the mass that was seen appears to be a lymph node with a fatty hilum, which is a characteristic of benign lymph nodes. - reviewed Tg (improved) - will repeat at next visit. - I believe that we can consider her cured, since the cancer was subcentimeter and noninvasive  3. Postsurgical hypothyroidism - She is taking her levothyroxine correctly - We reviewed last set of her TFTs from 12/2014 >> normal - Continue LT4 150 mcg daily. - will check TFTs at next visit

## 2015-09-18 ENCOUNTER — Encounter: Payer: Self-pay | Admitting: Internal Medicine

## 2015-09-18 ENCOUNTER — Ambulatory Visit (INDEPENDENT_AMBULATORY_CARE_PROVIDER_SITE_OTHER): Payer: BLUE CROSS/BLUE SHIELD | Admitting: Internal Medicine

## 2015-09-18 VITALS — BP 138/80 | HR 85 | Temp 97.7°F | Resp 12 | Wt 248.0 lb

## 2015-09-18 DIAGNOSIS — E890 Postprocedural hypothyroidism: Secondary | ICD-10-CM

## 2015-09-18 DIAGNOSIS — IMO0002 Reserved for concepts with insufficient information to code with codable children: Secondary | ICD-10-CM

## 2015-09-18 DIAGNOSIS — E1121 Type 2 diabetes mellitus with diabetic nephropathy: Secondary | ICD-10-CM

## 2015-09-18 DIAGNOSIS — C73 Malignant neoplasm of thyroid gland: Secondary | ICD-10-CM | POA: Diagnosis not present

## 2015-09-18 DIAGNOSIS — E1165 Type 2 diabetes mellitus with hyperglycemia: Secondary | ICD-10-CM | POA: Diagnosis not present

## 2015-09-18 LAB — HEMOGLOBIN A1C: Hgb A1c MFr Bld: 9.8 % — ABNORMAL HIGH (ref 4.6–6.5)

## 2015-09-18 LAB — TSH: TSH: 1.03 u[IU]/mL (ref 0.35–4.50)

## 2015-09-18 LAB — T4, FREE: Free T4: 1.13 ng/dL (ref 0.60–1.60)

## 2015-09-18 MED ORDER — INSULIN DEGLUDEC 200 UNIT/ML ~~LOC~~ SOPN: 80.0000 [IU] | PEN_INJECTOR | Freq: Every day | SUBCUTANEOUS | Status: DC

## 2015-09-18 NOTE — Patient Instructions (Signed)
Please continue: - Metformin 500 mg 2x a day.  Please increase: - Tresiba U200 to 80 units in am - ReliOn Novolin to 3x a day: - before breakfast and dinner:  20 units with a regular meal  25 units with a larger meal - before lunch:   15 units   Please schedule an appt with nutrition.  Please come back for a follow-up appointment in 1.5 months with your sugar log.

## 2015-09-18 NOTE — Progress Notes (Signed)
Patient ID: Courtney [name], female   DOB: [birth year], 60 y.o.   MRN: [id number]  HPI: Courtney [name] is a 60 y.o.-year-old female, initially referred by her PCP, Dr. Daiva Eves, for management of DM2, dx in ~2000, insulin-dependent since 2011, uncontrolled, with complications (CKD - sees nephrology, DR) also postsurgical hypothyroidism for Thyroid cancer. Last visit 3 months ago.  DM2: Last hemoglobin A1c was: Lab Results  Component Value Date   HGBA1C 9.9 06/19/2015   HGBA1C 11.0 03/27/2015   HGBA1C 10.9* 12/26/2014  08/01/2014: HbA1c 14.5% 05/02/2014: HbA1c 14.8% 01/31/2014: HbA1c 13.6% 11/01/2013: HbA1c 15.1%  Pt was on a regimen of: - Novolin 70/30 48 units 2x a day 15 min before the meals - but may forget - misses 5/14 doses a week - Amaryl 8 mg in am She was on Metformin >> stopped 2/2 CKD.  At last visit, we switched to: - Metformin 500 mg twice a day - Tresiba U200 70 units in am - tries not to miss any doses - Novolog 2x a day, 30 min before breakfast and dinner - misses doses  20 units with a regular meal  25 units with a larger meal If sugars before dinner are still high, may need 15 units with lunch.  She is checking 3x a day - + log - few values - am: 308-333, before: 150s >> 76-150, 160 >> 135-140 >> 233-246, 299 >> 145-228 >> 163-351 - 2h after b'fast: n/c - before lunch: n/c >> 72-149 >> n/c - 2h after lunch: n/c >> 67, 228 >> n/c - before dinner: n/c >> 65-37, 190-207 >> 135-140 >> 240s >> n/c - 2h after dinner: n/c >> 131, 269 >> n/c - bedtime: n/c >> 81-160 >> n/c >> 240s >> n/c - nighttime: n/c No lows. Lowest sugar was 98 >> 233 >> 138; ? hypoglycemia awareness Highest sugar was 140s >> 299 >> 200s >> 351.  She goes to work at 8 pm, but still works some days.   Glucometer: Prodigy  Pt's meals are - she started to make changes: - Breakfast: varies - mostly eats out - may skip. Oatmeal, sausage or bacon, apples - Lunch: peanuts, sandwich +  crystal lite drink - Dinner: frozen dinner - Snacks: fruit, small bag potato chips, peanuts Drinks Cool Aid instead of sodas now >> started to drink non-sweet drinks  - + stage 3 CKD, last BUN/creatinine:  08/01/2014: 15/1.26, GFR 55 - last set of lipids: 08/01/2014: 145/78/74/55 - last eye exam was in 09/04/2015 >> DR in R eye, had Laser Sx, also getting intraocular injections - last 05/2015; L eye cataract >> had cataract sx 02/25/2015. She had 2 inj in the L eye for swelling after the cataract Sx. - no numbness and tingling in her feet.  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  - She did not have RAI treatment at that time, however, she was found to have a thyroid mass in 2005 >> This was biopsied and returned as normal thyroid tissue, but had RAI treatment at that time.  - The posttreatment whole-body scan was negative for any cancer spread.   No neck compression symptoms now  Thyroglobulin returned at 2.3 in 08/2014 >> I ordered a neck ultrasound that showed a stable nodule with a fatty hilum, consistent with a benign lymph node, but no  other masses in the surgical bed.   Latest labs: Component     Latest Ref Rng 08/22/2014 12/26/2014  Thyroglobulin     2.8 - 40.9 ng/mL 2.3 (L)   Thyroglobulin Ab     <2 IU/mL <1   Thyroglobulin Antibody     0.0 - 0.9 IU/mL <1.0 <1.0  Thyroglobulin by IMA     1.5 - 38.5 ng/mL  0.8 (L)   Postsurgical hypothyroidism: Last TSH:  Lab Results  Component Value Date   TSH 2.72 12/26/2014   FREET4 1.00 12/26/2014  08/01/2014: TSH 1.89  She is on LT4 150 mcg daily: - in am, before her dinner - with water - eats b'fast 30 min later - No calcium, iron, PPI, MVI  ROS: Constitutional: no weight gain/loss, + fatigue, no subjective hypothermia Eyes: no blurry vision, no  xerophthalmia ENT: no sore throat, see history of present illness, no hoarseness Cardiovascular: no CP/SOB/palpitations/+ leg swelling Respiratory: no cough/SOB Gastrointestinal: no N/V/D/C Musculoskeletal: no muscle/joint aches Skin: no rashes Neurological: no tremors/numbness/tingling/dizziness Psychiatric: no depression/anxiety  I reviewed pt's medications, allergies, PMH, social hx, family hx, and changes were documented in the history of present illness. Otherwise, unchanged from my initial visit note.  Past Medical History  Diagnosis Date  . Diabetes mellitus without complication ([place])   . Hypertension   . Thyroid disease   . Chronic kidney disease   . Anemia   . MRSA infection 2013    on leg; surgery    Past Surgical History  Procedure Laterality Date  . 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 Cholecystectomy    . Trigger finger release  2003  . Bilateral carpal tunnel release Bilateral 2003  . Shoulder arthroscopy  2004  . Thyroid surgery  2004   History   Social History Main Topics  . Smoking status: Former Research scientist (life sciences)  . Smokeless tobacco: Not on file  . Alcohol Use: No  . Drug Use: No   Social History Narrative   Single   0 children   [place] (travels regularly)      Beer and wine on occasion   First menstrual cycle: 6th grade   Postmenopausal      Current Outpatient Rx  Name  Route  Sig  Dispense  Refill  . amLODipine (NORVASC) 10 MG tablet   Oral   Take 10 mg by mouth daily.          . B Complex Vitamins (VITAMIN B COMPLEX PO)   Oral   Take 1 capsule by mouth daily.         . diphenhydrAMINE (BENADRYL ALLERGY) 25 MG tablet   Oral   Take 25 mg by mouth as needed.          Marland Kitchen glucose blood test strip      Use as instructed 3x a day   200 each   3   . Insulin Degludec (TRESIBA FLEXTOUCH) 200 UNIT/ML SOPN   Subcutaneous   Inject 70 Units into the skin daily. At bedtime   3 pen   2   . Insulin Pen Needle (CLICKFINE PEN NEEDLES)  32G X 4 MM MISC      Use 1x a day   100 each   2   . insulin regular (NOVOLIN R) 100 units/mL injection      Inject 20-25 units of insulin into the skin 2 times daily. 30 minutes before meals.   20 mL   2  RELION BRAND.   Marland Kitchen Insulin Syringe-Needle U-100 (B-D INS SYRINGE 0.5CC/30GX1/2") 30G X 1/2" 0.5 ML MISC      Use to inject insulin 3 times daily as instructed.   100 each   11   . levothyroxine (SYNTHROID, LEVOTHROID) 150 MCG tablet   Oral   Take 150 mcg by mouth daily before breakfast.          . losartan (COZAAR) 100 MG tablet   Oral   Take 100 mg by mouth daily.          . metFORMIN (GLUCOPHAGE) 500 MG tablet   Oral   Take 1 tablet (500 mg total) by mouth 2 (two) times daily with a meal.   60 tablet   5   . NOVOLOG 100 UNIT/ML injection   Subcutaneous   Inject 15-20 Units into the skin 2 (two) times daily before a meal.       0     Dispense as written.   . Vitamin D, Ergocalciferol, (DRISDOL) 50000 UNITS CAPS capsule            1    NKDA   Family History  Problem Relation Age of Onset  . Hypertension Mother   . Thyroid disease Mother   . Hypertension Sister   . Thyroid disease Sister   . Hypertension Brother   . Hyperlipidemia Brother    PE: BP 138/80 mmHg  Pulse 85  Temp(Src) 97.7 F (36.5 C) (Oral)  Resp 12  Wt 248 lb (112.492 kg)  SpO2 96% Body mass index is 38.25 kg/(m^2). Wt Readings from Last 3 Encounters:  09/18/15 248 lb (112.492 kg)  06/19/15 244 lb (110.678 kg)  03/27/15 237 lb (107.502 kg)   Constitutional: overweight, in NAD Eyes: PERRLA, EOMI, no exophthalmos ENT: moist mucous membranes, no neck masses palpable, no cervical lymphadenopathy Cardiovascular: RRR, No MRG Respiratory: CTA B Gastrointestinal: abdomen soft, NT, ND, BS+ Musculoskeletal: no deformities, strength intact in all 4 Skin: moist, warm, no rashes Neurological: no tremor with outstretched hands, DTR normal in all 4  ASSESSMENT: 1. DM2,  insulin-dependent, uncontrolled, with complications - CKD - DR  2. 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 She did not have RAI treatment at that time, however, she was found to have a thyroid mass in 2005. This was biopsied and returned as normal thyroid tissue. However, she had RAI treatment at that time. The posttreatment whole-body scan was negative for any cancer spread.   - Neck U/S (09/19/2014): 1. Post total thyroidectomy. 2. No change in the previously biopsied approximately 1.4 cm echogenic solid nodule within the inferior aspect of the right lobe of the thyroid, grossly unchanged since the 2005 examination. 3. Apparent development of an approximately 0.4 cm hypoechoic nodule within the right thyroidectomy bed - while too small for definitive characterization, this nodule appears to contain an echogenic hilum and thus is favored to represent a non pathologically enlarged cervical lymph node.  3. Post surgical hypothyroidism  PLAN:  1. Patient with long-standing, uncontrolled diabetes, now on basal-bolus insulin regimen with Tyler Aas and NovoLog + Metformin (500 mg bid) as Metformin use has now been approved by FDA based on GFR, rather than creatinine - no restrictions for GFR >45. She had recent labs by PCP >> will need to ask for records.  - She does bring a  CBG log or meter >> checks sugars only in am >> 200-300s - will check HbA1c today. At last visit, it was 9.9% (high, but better).  - Will increase [place] and add lunchtime rapid acting insulin. It will be paramount to change her diet, though, which consists mainly of carbs (per review of her diet log). She has a friend who is a Engineer, maintenance (IT) . I advised her to let me know if she wants to d/w her friend or wants me to refer her to nutrition here.  Patient  Instructions  Please continue: - Metformin 500 mg 2x a day.  Please increase: - Tresiba U200 to 80 units in am - ReliOn Novolin to 3x a day: - before breakfast and dinner:  20 units with a regular meal  25 units with a larger meal - before lunch:   15 units   Please schedule an appt with nutrition.  Please come back for a follow-up appointment in 1.5 months with your sugar log.  - Continue checking sugars at different times of the day - check 3 times a day, rotating checks - refused flu vaccine at last visit - advised for yearly eye exams >> she is up-to-date - Return in about 6 weeks (around 10/30/2015).  2. 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 On her last thyroid ultrasound >> the mass that was seen appears to be a lymph node with a fatty hilum, which is a characteristic of benign lymph nodes. - reviewed Tg (improved) - will repeat today - I believe that we can consider her cured, since the cancer was subcentimeter and noninvasive  3. Postsurgical hypothyroidism - She is taking her levothyroxine correctly - We reviewed last set of her TFTs from 12/2014 >> normal - Continue LT4 150 mcg daily. - will check TFTs today. Took B complex but in am, now end of the afternoon.  Component     Latest Ref Rng 09/18/2015  Thyroglobulin     2.8 - 40.9 ng/mL 2.4 (L)  Thyroglobulin Ab     <2 IU/mL <1  TSH     0.35 - 4.50 uIU/mL 1.03  Free T4     0.60 - 1.60 ng/dL 1.13  Hemoglobin A1C     4.6 - 6.5 % 9.8 (H)   Tg low, stable, but detectable. If this remains detectable at next check (will need [facility] assay) >> plan to check a WBS. TFTs great. HbA1c high. See plan above to increase insulin.

## 2015-09-19 LAB — THYROGLOBULIN LEVEL: Thyroglobulin: 2.4 ng/mL — ABNORMAL LOW (ref 2.8–40.9)

## 2015-09-19 LAB — THYROGLOBULIN ANTIBODY: Thyroglobulin Ab: <1 IU/mL (ref <2)

## 2015-09-23 ENCOUNTER — Telehealth: Payer: Self-pay | Admitting: Internal Medicine

## 2015-09-23 ENCOUNTER — Encounter: Payer: Self-pay | Admitting: Internal Medicine

## 2015-09-23 MED ORDER — INSULIN DEGLUDEC 200 UNIT/ML ~~LOC~~ SOPN: 80.0000 [IU] | PEN_INJECTOR | Freq: Every day | SUBCUTANEOUS | Status: DC

## 2015-09-23 NOTE — Telephone Encounter (Signed)
Refill sent to pt's pharmacy. 

## 2015-09-23 NOTE — Telephone Encounter (Signed)
Patient need a refill of  Insulin Degludec (TRESIBA FLEXTOUCH) 200 UNIT/ML SOPN 3    [facility] [address] [place], [place] - [address] [address] [phone] (Phone) [phone] (Fax)

## 2015-09-23 NOTE — Progress Notes (Signed)
Received labs from PCP, from 09/18/2015: - CMP: Normal, with the exception of glucose 136. BUN/creatinine 14/1.2, GFR 57 (previously 55) - Lipid panel 160/57/87/62 - TSH 1.55

## 2015-10-30 ENCOUNTER — Telehealth: Payer: Self-pay | Admitting: Internal Medicine

## 2015-10-30 ENCOUNTER — Other Ambulatory Visit: Payer: Self-pay | Admitting: *Deleted

## 2015-10-30 MED ORDER — INSULIN REGULAR HUMAN 100 UNIT/ML IJ SOLN: 20.0000 [IU] | Freq: Three times a day (TID) | INTRAMUSCULAR | Status: DC

## 2015-10-30 MED ORDER — INSULIN DEGLUDEC 200 UNIT/ML ~~LOC~~ SOPN: 80.0000 [IU] | PEN_INJECTOR | Freq: Every day | SUBCUTANEOUS | Status: DC

## 2015-10-30 MED ORDER — NOVOLOG 100 UNIT/ML ~~LOC~~ SOLN: 15.0000 [IU] | Freq: Three times a day (TID) | SUBCUTANEOUS | Status: DC

## 2015-10-30 NOTE — Telephone Encounter (Signed)
Pt called and needs insulin and Tresiba sent to [facility] in [place].

## 2015-11-06 ENCOUNTER — Encounter: Payer: Self-pay | Admitting: Internal Medicine

## 2015-11-06 ENCOUNTER — Ambulatory Visit (INDEPENDENT_AMBULATORY_CARE_PROVIDER_SITE_OTHER): Payer: BLUE CROSS/BLUE SHIELD | Admitting: Internal Medicine

## 2015-11-06 VITALS — BP 132/78 | HR 80 | Temp 98.8°F | Resp 12 | Wt 250.0 lb

## 2015-11-06 DIAGNOSIS — E1121 Type 2 diabetes mellitus with diabetic nephropathy: Secondary | ICD-10-CM

## 2015-11-06 DIAGNOSIS — C73 Malignant neoplasm of thyroid gland: Secondary | ICD-10-CM

## 2015-11-06 DIAGNOSIS — E890 Postprocedural hypothyroidism: Secondary | ICD-10-CM | POA: Diagnosis not present

## 2015-11-06 DIAGNOSIS — E1165 Type 2 diabetes mellitus with hyperglycemia: Secondary | ICD-10-CM

## 2015-11-06 DIAGNOSIS — IMO0002 Reserved for concepts with insufficient information to code with codable children: Secondary | ICD-10-CM

## 2015-11-06 NOTE — Patient Instructions (Signed)
Please continue: - Metformin 500 mg 2x a day. - Tresiba U200 70 units in am - ReliOn Novolin to 3x a day: - before breakfast and dinner:  20 units with a regular meal  25 units with a larger meal - before lunch:   15 units   Do not skip doses of insulin!  Please schedule an appt with nutrition.  Please continue Levothyroxine 150 mcg daily.  Take the thyroid hormone every day, with water, at least 30 minutes before breakfast, separated by at least 4 hours from: - acid reflux medications - calcium - iron - multivitamins  Please stop at the lab.  Please come back for a follow-up appointment in 3 months with your sugar log.

## 2015-11-06 NOTE — Progress Notes (Addendum)
Patient ID: Courtney [name], female   DOB: [birth year], 60 y.o.   MRN: [id number]  HPI: Courtney [name] is a 60 y.o.-year-old female, initially referred by her PCP, Dr. 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 Last visit 1.5 months ago.  DM2: Last hemoglobin A1c was: Lab Results  Component Value Date   HGBA1C 9.8* 09/18/2015   HGBA1C 9.9 06/19/2015   HGBA1C 11.0 03/27/2015  08/01/2014: HbA1c 14.5% 05/02/2014: HbA1c 14.8% 01/31/2014: HbA1c 13.6% 11/01/2013: HbA1c 15.1%  Pt was on a regimen of: - Novolin 70/30 48 units 2x a day 15 min before the meals - but may forget - misses 5/14 doses a week - Amaryl 8 mg in am She was on Metformin >> stopped 2/2 CKD.  At last visit, we switched to: - Metformin 500 mg 2x a day - Tresiba U200 70 units in am (did not increase to 80 as advised at last visit) - ReliOn Novolin to 2-3x a day: - before breakfast and dinner:  20 units with a regular meal  25 units with a larger meal (- before lunch:   15 units)  She is checking 1x a day - brings log  - sugars better when she does not skip insulin doses: - am: 308-333, before: 150s >> 76-150, 160 >> 135-140 >> 233-246, 299 >> 145-228 >> 163-351 >> 94-162, 218, 247 - 2h after b'fast: n/c - before lunch: n/c >> 72-149 >> n/c >> 81-134 - 2h after lunch: n/c >> 67, 228 >> n/c - before dinner: n/c >> 65-37, 190-207 >> 135-140 >> 240s >> n/c >> 142-162 - 2h after dinner: n/c >> 131, 269 >> n/c - bedtime: n/c >> 81-160 >> n/c >> 240s >> n/c - nighttime: n/c No lows. Lowest sugar was 98 >> 233 >> 138 >> 81; ? hypoglycemia awareness Highest sugar was 140s >> 299 >> 200s >> 351 >> 247.  She goes to work at 8 pm, but still works some days.   Glucometer: Prodigy  Pt's meals are - she started to make changes: - Breakfast: varies - mostly eats out  - may skip. 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 She had 2 inj in the L eye for swelling after the cataract Sx. - no numbness and tingling in her feet.  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  - She did not have RAI treatment at that time, however, she was found to have a thyroid mass in 2005 >> This was biopsied and returned as normal thyroid tissue, but had RAI treatment at that time.  - The posttreatment whole-body scan was negative for any cancer spread.   No neck compression symptoms now.  Thyroglobulin returned at 2.3 in 08/2014 >>  I ordered a neck ultrasound that showed a stable nodule with a fatty hilum, consistent with a benign lymph node, but no other masses in the surgical bed.   Latest labs: 09/18/2015: TSH 1.55 Component     Latest Ref Rng 08/22/2014 12/26/2014 09/18/2015  Thyroglobulin     2.8 - 40.9 ng/mL 2.3 (L)  2.4 (L)  Thyroglobulin Ab     <2 IU/mL <1  <1  Thyroglobulin Antibody     0.0 - 0.9 IU/mL <1.0 <1.0   TSH     0.35 - 4.50 uIU/mL  2.72   T4,Free(Direct)     0.60 - 1.60 ng/dL  1.00   Thyroglobulin by IMA     1.5 - 38.5 ng/mL  0.8 (L)     Postsurgical hypothyroidism: Last TSH:  Lab Results  Component Value Date   TSH 1.03 09/18/2015   TSH 2.72  12/26/2014   FREET4 1.13 09/18/2015   FREET4 1.00 12/26/2014  08/01/2014: TSH 1.89  She is on LT4 150 mcg daily: - in am, before her dinner - with water - eats b'fast 30 min later - No calcium, iron, PPI, MVI  ROS: Constitutional: no weight gain/loss, no fatigue, no subjective hypothermia Eyes: no blurry vision, no xerophthalmia ENT: no sore throat, see history of present illness, no hoarseness Cardiovascular: no CP/SOB/palpitations/leg swelling Respiratory: no cough/SOB Gastrointestinal: no N/V/D/C Musculoskeletal: no muscle/joint aches Skin: no rashes Neurological: no tremors/numbness/tingling/dizziness  I reviewed pt's medications, allergies, PMH, social hx, family hx, and changes were documented in the history of present illness. Otherwise, unchanged from my initial visit note.  Past Medical History  Diagnosis Date  . Diabetes mellitus without complication ([place])   . Hypertension   . Thyroid disease   . Chronic kidney disease   . Anemia   . MRSA infection 2013    on leg; surgery    Past Surgical History  Procedure Laterality Date  . 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 Cholecystectomy    . Trigger finger release  2003  . Bilateral carpal tunnel release Bilateral 2003  . Shoulder arthroscopy  2004  . Thyroid surgery  2004   History   Social History Main Topics  . Smoking status: Former Research scientist (life sciences)  . Smokeless tobacco: Not on file  . Alcohol Use: No  . Drug Use: No   Social History Narrative   Single   0 children   [place] (travels regularly)      Beer and wine on occasion   First menstrual cycle: 6th grade   Postmenopausal      Current Outpatient Rx  Name  Route  Sig  Dispense  Refill  . amLODipine (NORVASC) 10 MG tablet   Oral   Take 10 mg by mouth daily. Reported on 11/06/2015         . diphenhydrAMINE (BENADRYL ALLERGY) 25 MG tablet   Oral   Take 25 mg by mouth as needed.          Marland Kitchen glucose blood test strip      Use as instructed 3x  a day   200 each   3   . Insulin Degludec (TRESIBA FLEXTOUCH) 200 UNIT/ML SOPN   Subcutaneous   Inject 80 Units into the skin daily. At bedtime   3 pen   2   . Insulin Pen Needle (CLICKFINE PEN NEEDLES) 32G X 4 MM MISC      Use 1x a day   100 each   2   .  insulin regular (NOVOLIN R RELION) 100 units/mL injection   Subcutaneous   Inject 0.2-0.25 mLs (20-25 Units total) into the skin 3 (three) times daily before meals.   10 mL   2   . Insulin Syringe-Needle U-100 (B-D INS SYRINGE 0.5CC/30GX1/2") 30G X 1/2" 0.5 ML MISC      Use to inject insulin 3 times daily as instructed.   100 each   11   . levothyroxine (SYNTHROID, LEVOTHROID) 150 MCG tablet   Oral   Take 150 mcg by mouth daily before breakfast.          . losartan (COZAAR) 100 MG tablet   Oral   Take 100 mg by mouth daily.          . metFORMIN (GLUCOPHAGE) 500 MG tablet   Oral   Take 1 tablet (500 mg total) by mouth 2 (two) times daily with a meal.   60 tablet   5   . Vitamin D, Ergocalciferol, (DRISDOL) 50000 UNITS CAPS capsule            1   . B Complex Vitamins (VITAMIN B COMPLEX PO)   Oral   Take 1 capsule by mouth daily. Reported on 11/06/2015          NKDA   Family History  Problem Relation Age of Onset  . Hypertension Mother   . Thyroid disease Mother   . Hypertension Sister   . Thyroid disease Sister   . Hypertension Brother   . Hyperlipidemia Brother    PE: BP 132/78 mmHg  Pulse 80  Temp(Src) 98.8 F (37.1 C) (Oral)  Resp 12  Wt 250 lb (113.399 kg)  SpO2 97% Body mass index is 38.56 kg/(m^2). Wt Readings from Last 3 Encounters:  11/06/15 250 lb (113.399 kg)  09/18/15 248 lb (112.492 kg)  06/19/15 244 lb (110.678 kg)   Constitutional: overweight, in NAD Eyes: PERRLA, EOMI, no exophthalmos ENT: moist mucous membranes, no neck masses palpable, no cervical lymphadenopathy Cardiovascular: RRR, No MRG Respiratory: CTA B Gastrointestinal: abdomen soft, NT, ND,  BS+ Musculoskeletal: no deformities, strength intact in all 4 Skin: moist, warm, no rashes Neurological: no tremor with outstretched hands, DTR normal in all 4  ASSESSMENT: 1. DM2, insulin-dependent, uncontrolled, with complications - CKD - DR  2. 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 She did not have RAI treatment at that time, however, she was found to have a thyroid mass in 2005. This was biopsied and returned as normal thyroid tissue. However, she had RAI treatment at that time. The posttreatment whole-body scan was negative for any cancer spread.   - Neck U/S (09/19/2014): 1. Post total thyroidectomy. 2. No change in the previously biopsied approximately 1.4 cm echogenic solid nodule within the inferior aspect of the right lobe of the thyroid, grossly unchanged since the 2005 examination. 3. Apparent development of an approximately 0.4 cm hypoechoic nodule within the right thyroidectomy bed - while too small for definitive characterization, this nodule appears to contain an echogenic hilum and thus is favored to represent a non pathologically enlarged cervical lymph node.  3. Post surgical hypothyroidism  PLAN:  1. Patient with long-standing, uncontrolled diabetes, now on basal-bolus insulin regimen with Tyler Aas and NovoLog + Metformin (500 mg bid) as Metformin use has now been approved by FDA based on GFR, rather than creatinine -  no restrictions for GFR >45.  - She does bring a CBG log >> sugars better when she does not skip insulin doses >> advised her to try her best not to skip them - will check HbA1c at next visit. At last visit, it was 9.8% (still high) - will continue:  Patient Instructions  Please continue: - Metformin 500 mg 2x a day. - Tresiba U200 70 units in am - ReliOn Novolin to 3x a day: -  before breakfast and dinner:  20 units with a regular meal  25 units with a larger meal - before lunch:   15 units   Do not skip doses of insulin!  Please schedule an appt with nutrition.  Please come back for a follow-up appointment in 3 months with your sugar log.  - Continue checking sugars at different times of the day - check 2-3 times a day, rotating checks - refused flu vaccine at last visit - advised for yearly eye exams >> she is up-to-date - Return in about 3 months (around 02/03/2016).  2. 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 On her last thyroid ultrasound >> the mass that was seen appears to be a lymph node with a fatty hilum, which is a characteristic of benign lymph nodes. - reviewed Tg (detectable) >> repeat today by [facility] assay  3. Postsurgical hypothyroidism - She is taking her levothyroxine correctly  - We reviewed last set of her TFTs from 09/2015 >> normal - Continue LT4 150 mcg daily. Patient Instructions  Please continue Levothyroxine 150 mcg daily.  Take the thyroid hormone every day, with water, at least 30 minutes before breakfast, separated by at least 4 hours from: - acid reflux medications - calcium - iron - multivitamins  Office Visit on 11/06/2015  Component Date Value Ref Range Status  . Thyroglobulin (TG-RIA) 11/06/2015 9.9   Final   Comment: Reference Range: Pubertal Children and Adults: <40 According to the [facility] of Clinical Biochemistry, the reference interval for Thyroglobulin (TG) should be related to euthyroid patients and not for patients who underwent thyroidectomy.  TG reference intervals for these patients depend on the residual mass of the thyroid tissue left after surgery.  Establishing a post-operative  baseline is recommended.  The assay quantitation limit is 2.0 ng/mL.    Tg is higher than expected >> we need a new neck U/S.  CLINICAL DATA: 60 year old female with a history of thyroid cancer now status post thyroidectomy.  EXAM: THYROID ULTRASOUND  TECHNIQUE: Ultrasound examination of the thyroid gland and adjacent soft tissues was performed.  COMPARISON: Prior thyroid ultrasound 09/19/2014  FINDINGS: Right thyroid lobe  Surgically resected. Stable echogenic soft tissue in the resection bed measuring 1.4 x 1.0 cm. No significant interval change.  Left thyroid lobe  Surgically absent. No residual thyroid tissue or nodule.  Isthmus  Surgically absent.  Lymphadenopathy  No suspicious lymphadenopathy. Small 3 mm lymph node in the inferior aspect of the right thyroid resection bed.  IMPRESSION: 1. Surgical changes of prior total thyroidectomy. 2. Unchanged 1.4 cm echogenic soft tissue nodule in the right thyroid resection bed. Continued stability over time consistent with a benign process.   Electronically Signed By: Jacqulynn Cadet M.D. On: 11/27/2015 11:51             1.4 cm soft tissue nodule in the thyroid  bed, stable since 2005. No other suspicious lesions. I will repeat the thyroglobulin level at next visit, and may need a whole-body scan if still elevated.

## 2015-11-11 LAB — THYROGLOBULIN LEVEL: THYROGLOBULIN (TG-RIA): 9.9 ng/mL

## 2015-11-27 ENCOUNTER — Ambulatory Visit
Admission: RE | Admit: 2015-11-27 | Discharge: 2015-11-27 | Disposition: A | Discharge status: Home / Self Care | Payer: BLUE CROSS/BLUE SHIELD | Source: Ambulatory Visit | Attending: Internal Medicine | Admitting: Internal Medicine

## 2016-02-05 ENCOUNTER — Encounter: Payer: Self-pay | Admitting: Internal Medicine

## 2016-02-05 ENCOUNTER — Ambulatory Visit (INDEPENDENT_AMBULATORY_CARE_PROVIDER_SITE_OTHER): Payer: BLUE CROSS/BLUE SHIELD | Admitting: Internal Medicine

## 2016-02-05 ENCOUNTER — Other Ambulatory Visit (INDEPENDENT_AMBULATORY_CARE_PROVIDER_SITE_OTHER): Payer: BLUE CROSS/BLUE SHIELD | Admitting: *Deleted

## 2016-02-05 VITALS — BP 138/78 | HR 84 | Temp 98.0°F | Resp 12 | Wt 247.0 lb

## 2016-02-05 DIAGNOSIS — E1165 Type 2 diabetes mellitus with hyperglycemia: Secondary | ICD-10-CM

## 2016-02-05 DIAGNOSIS — IMO0002 Reserved for concepts with insufficient information to code with codable children: Secondary | ICD-10-CM

## 2016-02-05 DIAGNOSIS — E1121 Type 2 diabetes mellitus with diabetic nephropathy: Secondary | ICD-10-CM | POA: Diagnosis not present

## 2016-02-05 DIAGNOSIS — E890 Postprocedural hypothyroidism: Secondary | ICD-10-CM | POA: Diagnosis not present

## 2016-02-05 DIAGNOSIS — C73 Malignant neoplasm of thyroid gland: Secondary | ICD-10-CM

## 2016-02-05 LAB — POCT GLYCOSYLATED HEMOGLOBIN (HGB A1C): HEMOGLOBIN A1C: 9.1

## 2016-02-05 NOTE — Progress Notes (Signed)
Patient ID: Courtney [name], female   DOB: 10-May-1956, 60 y.o.   MRN: [id number]  HPI: Courtney [name] is a 60 y.o.-year-old female, initially referred by her PCP, Dr. 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 Last visit 3 months ago.  She will have L meniscectomy  - needs clearance.   She has leg and feet cramps.   She also had nausea and diarrhea.   DM2: Last hemoglobin A1c was: Lab Results  Component Value Date   HGBA1C 9.8* 09/18/2015   HGBA1C 9.9 06/19/2015   HGBA1C 11.0 03/27/2015  08/01/2014: HbA1c 14.5% 05/02/2014: HbA1c 14.8% 01/31/2014: HbA1c 13.6% 11/01/2013: HbA1c 15.1%  Pt was on a regimen of: - Novolin 70/30 48 units 2x a day 15 min before the meals - but may forget - misses 5/14 doses a week - Amaryl 8 mg in am She was on Metformin >> stopped 2/2 CKD.  At last visit, we switched to: - Metformin 500 mg 2x a day - Tresiba U200 70 units in am - ReliOn Novolin to 2-3x a day: - before breakfast and dinner:  20 units with a regular meal  25 units with a larger meal (- before lunch:   15 units) - not eating lunch  She is checking 1x a day - brings log  - sugars better when she does not skip insulin doses: - am: 135-140 >> 233-246, 299 >> 145-228 >> 163-351 >> 94-162, 218, 247 >> 80-189, 238, 264 (sick - could not take meds) - 2h after b'fast: n/c - before lunch: n/c >> 72-149 >> n/c >> 81-134 >> 148, 170 - 2h after lunch: n/c >> 67, 228 >> n/c - before dinner: n/c >> 65-37, 190-207 >> 135-140 >> 240s >> n/c >> 142-162 >> n/c - 2h after dinner: n/c >> 131, 269 >> n/c - bedtime: n/c >> 81-160 >> n/c >> 240s >> n/c - nighttime: n/c No lows. Lowest sugar was 98 >> 233 >> 138 >> 81 >> 80; ? hypoglycemia awareness Highest sugar was 140s >> 299 >> 200s >> 351 >> 247 >> 264.  She goes to work at 8 pm, but  still works some days.   Glucometer: Prodigy  Pt's meals are - she started to make changes: - Breakfast: varies - mostly eats out - may skip. 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  - no numbness and tingling in her feet.  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  - She did not have RAI treatment at that time, however, she was found to have a thyroid mass in 2005 >> This was biopsied and returned as normal thyroid tissue, but had RAI treatment at that time.  - The posttreatment whole-body scan was  negative for any cancer spread.   No neck compression symptoms now.  Thyroglobulin returned at 2.3 in 08/2014 >> I ordered a neck ultrasound that showed a stable nodule with a fatty hilum, consistent with a benign lymph node, but no other masses in the surgical bed.   The thyroglobulin level remained detectable, and was actually 9.9 at last visit. I ordered a neck ultrasound and this showed a stable 1.4 nodule in the thyroid bed, consistent with a benign process.  Latest labs: Component     Latest Ref Rng 08/22/2014 12/26/2014 09/18/2015 11/06/2015  Thyroglobulin     2.8 - 40.9 ng/mL 2.3 (L)  2.4 (L)   Thyroglobulin Ab     <2 IU/mL <1  <1   Thyroglobulin Antibody     0.0 - 0.9 IU/mL <1.0 <1.0     Thyroglobulin by IMA     1.5 - 38.5 ng/mL  0.8 (L)    THYROGLOBULIN (TG-RIA)         9.9   Postsurgical hypothyroidism: Last TSH:  Lab Results  Component Value Date   TSH 1.03 09/18/2015   TSH 2.72 12/26/2014   FREET4 1.13 09/18/2015   FREET4 1.00 12/26/2014  08/01/2014: TSH 1.89  She is on LT4 150 mcg daily: - in am, before her dinner - with water - eats b'fast 30 min later - No calcium, iron, PPI, MVI  ROS: Constitutional: no weight gain/loss, no fatigue, no subjective hypothermia Eyes: no blurry vision, no xerophthalmia ENT: no sore throat, see history of present illness, no hoarseness Cardiovascular: no CP/SOB/palpitations/leg swelling Respiratory: no cough/SOB Gastrointestinal: no N/V/D/C Musculoskeletal: no muscle/joint aches Skin: no rashes Neurological: no tremors/numbness/tingling/dizziness  I reviewed pt's medications, allergies, PMH, social hx, family hx, and changes were documented in the history of present illness. Otherwise, unchanged from my initial visit note.  Past Medical History  Diagnosis Date  . Diabetes mellitus without complication ([place])   . Hypertension   . Thyroid disease   . Chronic kidney disease   . Anemia   . MRSA infection 2013    on leg; surgery    Past Surgical History  Procedure Laterality Date  . 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 Cholecystectomy    . Trigger finger release  2003  . Bilateral carpal tunnel release Bilateral 2003  . Shoulder arthroscopy  2004  . Thyroid surgery  2004   History   Social History Main Topics  . Smoking status: Former Research scientist (life sciences)  . Smokeless tobacco: Not on file  . Alcohol Use: No  . Drug Use: No   Social History Narrative   Single   0 children   [place] (travels regularly)      Beer and wine on occasion   First menstrual cycle: 6th grade   Postmenopausal      Current Outpatient Rx  Name  Route  Sig  Dispense  Refill  . amLODipine (NORVASC) 10 MG tablet   Oral   Take 10 mg  by mouth daily. Reported on 11/06/2015         . B Complex Vitamins (VITAMIN B COMPLEX PO)   Oral   Take 1 capsule by mouth daily. Reported on 11/06/2015         . diphenhydrAMINE (BENADRYL ALLERGY) 25 MG tablet   Oral   Take 25 mg by mouth as needed.          Marland Kitchen glucose blood test strip      Use as instructed 3x a  day   200 each   3   . Insulin Degludec (TRESIBA FLEXTOUCH) 200 UNIT/ML SOPN   Subcutaneous   Inject 80 Units into the skin daily. At bedtime   3 pen   2   . Insulin Pen Needle (CLICKFINE PEN NEEDLES) 32G X 4 MM MISC      Use 1x a day   100 each   2   . insulin regular (NOVOLIN R RELION) 100 units/mL injection   Subcutaneous   Inject 0.2-0.25 mLs (20-25 Units total) into the skin 3 (three) times daily before meals.   10 mL   2   . Insulin Syringe-Needle U-100 (B-D INS SYRINGE 0.5CC/30GX1/2") 30G X 1/2" 0.5 ML MISC      Use to inject insulin 3 times daily as instructed.   100 each   11   . levothyroxine (SYNTHROID, LEVOTHROID) 150 MCG tablet   Oral   Take 150 mcg by mouth daily before breakfast.          . losartan (COZAAR) 100 MG tablet   Oral   Take 100 mg by mouth daily.          . metFORMIN (GLUCOPHAGE) 500 MG tablet   Oral   Take 1 tablet (500 mg total) by mouth 2 (two) times daily with a meal.   60 tablet   5   . Vitamin D, Ergocalciferol, (DRISDOL) 50000 UNITS CAPS capsule            1    NKDA   Family History  Problem Relation Age of Onset  . Hypertension Mother   . Thyroid disease Mother   . Hypertension Sister   . Thyroid disease Sister   . Hypertension Brother   . Hyperlipidemia Brother    PE: BP 138/78 mmHg  Pulse 84  Temp(Src) 98 F (36.7 C) (Oral)  Resp 12  Wt 247 lb (112.038 kg)  SpO2 97% Body mass index is 38.09 kg/(m^2). Wt Readings from Last 3 Encounters:  02/05/16 247 lb (112.038 kg)  11/06/15 250 lb (113.399 kg)  09/18/15 248 lb (112.492 kg)   Constitutional: overweight, in NAD Eyes: PERRLA,  EOMI, no exophthalmos ENT: moist mucous membranes, no neck masses palpable, no cervical lymphadenopathy Cardiovascular: RRR, No MRG Respiratory: CTA B Gastrointestinal: abdomen soft, NT, ND, BS+ Musculoskeletal: no deformities, strength intact in all 4 Skin: moist, warm, no rashes Neurological: no tremor with outstretched hands, DTR normal in all 4  ASSESSMENT: 1. DM2, insulin-dependent, uncontrolled, with complications - CKD - DR  2. 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 She did not have RAI treatment at that time, however, she was found to have a thyroid mass in 2005. This was biopsied and returned as normal thyroid tissue. However, she had RAI treatment at that time. The posttreatment whole-body scan was negative for any cancer spread.   - Neck U/S (09/19/2014): 1. Post total thyroidectomy. 2. No change in the previously biopsied approximately 1.4 cm echogenic solid nodule within the inferior aspect of the right lobe of the thyroid, grossly unchanged since the 2005 examination. 3. Apparent development of an approximately 0.4 cm hypoechoic nodule within the right thyroidectomy bed - while too small for definitive characterization, this nodule appears to contain an echogenic hilum and thus is favored to represent a non pathologically enlarged cervical lymph node.  -  Neck U/S (11/27/2015):  1. Surgical changes of prior total thyroidectomy. 2. Unchanged 1.4 cm echogenic soft tissue nodule in the right thyroid resection bed. Continued stability over time consistent with a benign process.  3. Post surgical hypothyroidism  PLAN:  1. Patient with long-standing, uncontrolled diabetes, on basal-bolus insulin regimen with Tyler Aas and NovoLog + Metformin (500 mg bid) as Metformin use has now been approved by FDA based on  GFR, rather than creatinine - no restrictions for GFR >45.  - She does bring a CBG log with few sugars >> sugars very fluctuating as she still skips doses of insulin...  - will continue:  Patient Instructions  Please continue: - Metformin 500 mg 2x a day. Try to stop this for 1 week to see if nausea improved. - Tresiba U200 70 units in am - ReliOn Novolin to 3x a day: - before breakfast and dinner:  20 units with a regular meal  25 units with a larger meal - before lunch:   15 units   Do not skip doses of insulin!  Please return in 3 months with your sugar log.   - Continue checking sugars at different times of the day - check 2-3 times a day, rotating checks - refused flu vaccine at last visits - checked HbA1c >> 9.1% (slightly lower) >> I advised her that she is not cleared for surgery as her HbA1c needs to be <9% - advised for yearly eye exams >> she is up-to-date - Return in about 3 months (around 05/07/2016).  2. 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 On her last thyroid ultrasound obtained after last visit >> there is a 1.4 cm mass in the thyroid bed, stable from 2005 - reviewed Tg (detectable) >>  will need to obtain a whole-body scan >> she would like to start feeling better first  3. Postsurgical hypothyroidism - She is taking her levothyroxine correctly  - We reviewed last set of her TFTs from 09/2015 >> normal - Continue LT4 150 mcg daily. Patient Instructions  Please continue Levothyroxine 150 mcg daily.  Take the thyroid hormone every day, with water, at least 30 minutes before breakfast, separated by at least 4 hours from: - acid reflux medications - calcium - iron - multivitamins

## 2016-02-05 NOTE — Patient Instructions (Addendum)
Please continue: - Metformin 500 mg 2x a day. Try to stop this for 1 week to see if nausea improved. - Tresiba U200 70 units in am - ReliOn Novolin to 3x a day: - before breakfast and dinner:  20 units with a regular meal  25 units with a larger meal - before lunch:   15 units   Do not skip doses of insulin!  Please continue Levothyroxine 150 mcg daily.  Take the thyroid hormone every day, with water, at least 30 minutes before breakfast, separated by at least 4 hours from: - acid reflux medications - calcium - iron - multivitamins  Please return in 3 months with your sugar log.

## 2016-02-09 ENCOUNTER — Other Ambulatory Visit: Payer: Self-pay | Admitting: Orthopedic Surgery

## 2016-02-11 ENCOUNTER — Telehealth: Payer: Self-pay | Admitting: Internal Medicine

## 2016-02-11 NOTE — Telephone Encounter (Signed)
Pt needs to know what is going on as to why we are not clearing her for the surgery?

## 2016-02-11 NOTE — Pre-Procedure Instructions (Signed)
As instructed by dr Nelva Nay needs to be cleared by diabetic doc as well as pcp and kidney doc. Left message for Pricella Gaugh morton at dr Mack Guise.

## 2016-02-12 ENCOUNTER — Telehealth: Payer: Self-pay | Admitting: Internal Medicine

## 2016-02-12 NOTE — Telephone Encounter (Signed)
I contacted the pt and left a vm advising of note below. Requested a call back if the pt would like to discuss.  

## 2016-02-12 NOTE — Telephone Encounter (Signed)
Informed pt of megans note  Surgeon has already cancelled it and is asking the pt to see if we can see her sooner so this reading can be addressed.

## 2016-02-12 NOTE — Telephone Encounter (Signed)
Please ask her to talk to the surgeon and I will leave it up to him >> he will need to know that her HbA1c is 9.1%.

## 2016-02-12 NOTE — Telephone Encounter (Signed)
See note below and please advise, Thanks! 

## 2016-02-12 NOTE — Telephone Encounter (Signed)
The HbA1c can only be done every 3 months based on the lifespan of the red blood cells... We can check it in 2 months from now, but it won't be as accurate. I will let her decide ifshe want's to do this, though.Marland Kitchen

## 2016-02-12 NOTE — Telephone Encounter (Signed)
I contacted the pt and left a vm advising Dr. Cruzita Lederer would like for her to consult with her surgeon and see if he is ok with doing the surgery with the pt's a1c of a 9.1. Requested a call back if the pt would like to discuss.

## 2016-02-12 NOTE — Telephone Encounter (Signed)
Patient need to talk to Dr Cruzita Lederer about paper work, before her surgery on the 15th

## 2016-02-12 NOTE — Telephone Encounter (Signed)
Pt calling regarding surgery paperwork

## 2016-02-12 NOTE — Telephone Encounter (Signed)
I contacted the pt. Pt stated she is concerned now with waiting for 3 months for surgery clearance because this surgery is under workman's comp and she has been out of work. Pt wanted to know if there is anything she can do so she can be cleared for surgery sooner. The pt has been advised she can have the surgery on the 15 th of this month and would really like to because she does not want to be out of work for the next three months.  Please advise, Thanks!

## 2016-02-12 NOTE — Telephone Encounter (Signed)
As I explained to her when she was here, her HbA1c is higher than 9% (9.1%), and she needs to be lower than 9% to be cleared for surgery. At last visit, she understood this and we decided to give her 3 more months to improve the A1c before I can clear her for surgery since poor diabetes control can impede her chances of healing after surgery.

## 2016-02-15 ENCOUNTER — Other Ambulatory Visit: Payer: BLUE CROSS/BLUE SHIELD

## 2016-02-16 NOTE — Telephone Encounter (Signed)
Called pt and advised her per Dr Arman Filter message. Pt to come Aug 2nd for labs.

## 2016-02-25 ENCOUNTER — Ambulatory Visit
Admission: RE | Admit: 2016-02-25 | Payer: BLUE CROSS/BLUE SHIELD | Source: Ambulatory Visit | Admitting: Orthopedic Surgery

## 2016-02-25 ENCOUNTER — Encounter: Admission: RE | Payer: Self-pay | Source: Ambulatory Visit

## 2016-02-25 SURGERY — ARTHROSCOPY, KNEE, WITH MEDIAL MENISCECTOMY
Anesthesia: Choice | Laterality: Left

## 2016-03-01 ENCOUNTER — Other Ambulatory Visit: Payer: Self-pay | Admitting: Internal Medicine

## 2016-03-24 ENCOUNTER — Encounter: Payer: Self-pay | Admitting: Internal Medicine

## 2016-03-24 NOTE — Progress Notes (Signed)
Received labs from PCP, drawn on 03/18/2016: TSH 0.674, excellent BUN/creatinine 16/1.21, EGFR 57 Calcium 8.7 Lipids: 156/85/70/69 AST/ALT 17/24 25 hydroxy vitamin D 39.6  I will scan the lab results.

## 2016-04-13 ENCOUNTER — Other Ambulatory Visit: Payer: Self-pay

## 2016-04-13 ENCOUNTER — Other Ambulatory Visit (INDEPENDENT_AMBULATORY_CARE_PROVIDER_SITE_OTHER): Payer: BLUE CROSS/BLUE SHIELD

## 2016-04-13 ENCOUNTER — Other Ambulatory Visit: Payer: BLUE CROSS/BLUE SHIELD

## 2016-04-13 DIAGNOSIS — E1121 Type 2 diabetes mellitus with diabetic nephropathy: Secondary | ICD-10-CM

## 2016-04-13 DIAGNOSIS — E1165 Type 2 diabetes mellitus with hyperglycemia: Secondary | ICD-10-CM

## 2016-04-13 DIAGNOSIS — IMO0002 Reserved for concepts with insufficient information to code with codable children: Secondary | ICD-10-CM

## 2016-04-13 LAB — HEMOGLOBIN A1C: HEMOGLOBIN A1C: 8.2 % — AB (ref 4.6–6.5)

## 2016-04-14 ENCOUNTER — Telehealth: Payer: Self-pay | Admitting: Internal Medicine

## 2016-04-14 ENCOUNTER — Telehealth: Payer: Self-pay

## 2016-04-14 NOTE — Telephone Encounter (Signed)
PT calling about lab results, requests call back

## 2016-04-15 NOTE — Telephone Encounter (Signed)
Will call patient about lab results.

## 2016-04-19 ENCOUNTER — Other Ambulatory Visit: Payer: Self-pay

## 2016-04-19 ENCOUNTER — Other Ambulatory Visit: Payer: Self-pay | Admitting: Internal Medicine

## 2016-04-19 ENCOUNTER — Telehealth: Payer: Self-pay | Admitting: Internal Medicine

## 2016-04-19 MED ORDER — METFORMIN HCL 500 MG PO TABS: 500.0000 mg | ORAL_TABLET | Freq: Two times a day (BID) | ORAL | 0 refills | Status: DC

## 2016-04-19 NOTE — Telephone Encounter (Signed)
Check on forms that were sent over to Ortho to clear patient for surgery; confirmation of fax completed was sent back, so information has been sent and completed by Dr.Gherghe. Also RX request was sent into pharmacy.

## 2016-04-19 NOTE — Telephone Encounter (Signed)
Pt needs to know if the release form for surgery has been sent back to [facility]  Also needs Metformin refill sent to [facility] in [facility].

## 2016-04-20 ENCOUNTER — Telehealth: Payer: Self-pay | Admitting: Internal Medicine

## 2016-04-20 NOTE — Telephone Encounter (Signed)
Called and spoke to [place] from [place], regarding A1C level for patient. She had no questions.

## 2016-04-20 NOTE — Telephone Encounter (Signed)
[facility] called and was needing to verify A1C levels with you. Contact Sherri [phone]

## 2016-04-27 ENCOUNTER — Other Ambulatory Visit: Payer: Self-pay | Admitting: Orthopedic Surgery

## 2016-04-27 ENCOUNTER — Encounter
Admission: RE | Admit: 2016-04-27 | Discharge: 2016-04-27 | Disposition: A | Discharge status: Home / Self Care | Payer: BLUE CROSS/BLUE SHIELD | Source: Ambulatory Visit | Attending: Orthopedic Surgery | Admitting: Orthopedic Surgery

## 2016-04-27 DIAGNOSIS — Z01812 Encounter for preprocedural laboratory examination: Secondary | ICD-10-CM | POA: Diagnosis present

## 2016-04-27 DIAGNOSIS — Z01810 Encounter for preprocedural cardiovascular examination: Secondary | ICD-10-CM | POA: Diagnosis present

## 2016-04-27 HISTORY — DX: Unspecified osteoarthritis, unspecified site: M19.90

## 2016-04-27 HISTORY — DX: Other specified diabetes mellitus with diabetic neuropathy, unspecified: E13.40

## 2016-04-27 LAB — CBC
HCT: 34.2 % — ABNORMAL LOW (ref 35.0–47.0)
Hemoglobin: 11.1 g/dL — ABNORMAL LOW (ref 12.0–16.0)
MCH: 27.5 pg (ref 26.0–34.0)
MCHC: 32.6 g/dL (ref 32.0–36.0)
MCV: 84.2 fL (ref 80.0–100.0)
Platelets: 219 10*3/uL (ref 150–440)
RBC: 4.06 MIL/uL (ref 3.80–5.20)
RDW: 14.3 % (ref 11.5–14.5)
WBC: 5.5 10*3/uL (ref 3.6–11.0)

## 2016-04-27 LAB — DIFFERENTIAL
BASOS ABS: 0 10*3/uL (ref 0–0.1)
BASOS PCT: 1 %
EOS ABS: 0.3 10*3/uL (ref 0–0.7)
Eosinophils Relative: 5 %
Lymphocytes Relative: 24 %
Lymphs Abs: 1.3 10*3/uL (ref 1.0–3.6)
MONOS PCT: 11 %
Monocytes Absolute: 0.6 10*3/uL (ref 0.2–0.9)
NEUTROS PCT: 59 %
Neutro Abs: 3.2 10*3/uL (ref 1.4–6.5)

## 2016-04-27 LAB — BASIC METABOLIC PANEL
ANION GAP: 5 (ref 5–15)
BUN: 17 mg/dL (ref 6–20)
CALCIUM: 8.6 mg/dL — AB (ref 8.9–10.3)
CO2: 28 mmol/L (ref 22–32)
Chloride: 111 mmol/L (ref 101–111)
Creatinine, Ser: 1.29 mg/dL — ABNORMAL HIGH (ref 0.44–1.00)
GFR, EST AFRICAN AMERICAN: 51 mL/min — AB (ref >60)
GFR, EST NON AFRICAN AMERICAN: 44 mL/min — AB (ref >60)
GLUCOSE: 94 mg/dL (ref 65–99)
Potassium: 3.7 mmol/L (ref 3.5–5.1)
SODIUM: 144 mmol/L (ref 135–145)

## 2016-04-27 LAB — SURGICAL PCR SCREEN
MRSA, PCR: NEGATIVE
STAPHYLOCOCCUS AUREUS: NEGATIVE

## 2016-04-27 NOTE — Pre-Procedure Instructions (Signed)
Medical and Nephrology clearance on chart

## 2016-04-27 NOTE — Patient Instructions (Signed)
  Your procedure is scheduled on: May 05, 2016 (Thursday) Report to Same Day Surgery 2nd floor [facility] To find out your arrival time please call [phone] between 1PM - 3PM on May 04, 2016 (Wednesday)  Remember: Instructions that are not followed completely may result in serious medical risk, up to and including death, or upon the discretion of your surgeon and anesthesiologist your surgery may need to be rescheduled.    _x___ 1. Do not eat food or drink liquids after midnight. No gum chewing or hard candies.     _x___ 2. No Alcohol for 24 hours before or after surgery.   _x__3. No Smoking for 24 prior to surgery.   ____  4. Bring all medications with you on the day of surgery if instructed.    __x__ 5. Notify your doctor if there is any change in your medical condition     (cold, fever, infections).     Do not wear jewelry, make-up, hairpins, clips or nail polish.  Do not wear lotions, powders, or perfumes. You may wear deodorant.  Do not shave 48 hours prior to surgery. Men may shave face and neck.  Do not bring valuables to the hospital.    [facility] is not responsible for any belongings or valuables.               Contacts, dentures or bridgework may not be worn into surgery.  Leave your suitcase in the car. After surgery it may be brought to your room.  For patients admitted to the hospital, discharge time is determined by your treatment team.   Patients discharged the day of surgery will not be allowed to drive home.    Please read over the following fact sheets that you were given:   [facility] Preparing for Surgery and or MRSA Information   _x___ Take these medicines the morning of surgery with A SIP OF WATER:    1. AMLODIPINE  2. LOSARTAN  3.  4.  5.  6.  ____ Fleet Enema (as directed)   _x___ Use CHG Soap or sage wipes as directed on instruction sheet   ____ Use inhalers on the day of surgery and bring to hospital day of surgery  _x___ Stop  metformin 2 days prior to surgery (STOP METFORMIN ON AUGUST 22)    __x__ Take 1/2 of usual insulin dose the night before surgery and none on the morning of surgery (NO TRESIBA OR INSULIN)     .   _x___ Stop aspirin or coumadin, or plavix (NO ASPIRIN)  _x__ Stop Anti-inflammatories such as Advil, Aleve, Ibuprofen, Motrin, Naproxen,          Naprosyn, Goodies powders or aspirin products. Ok to take Tylenol.   ____ Stop supplements until after surgery.    ____ Bring C-Pap to the hospital.

## 2016-05-05 ENCOUNTER — Ambulatory Visit: Payer: Worker's Compensation | Admitting: Anesthesiology

## 2016-05-05 ENCOUNTER — Encounter: Payer: Self-pay | Admitting: *Deleted

## 2016-05-05 ENCOUNTER — Ambulatory Visit
Admission: RE | Admit: 2016-05-05 | Discharge: 2016-05-05 | Disposition: A | Discharge status: Home / Self Care | Payer: Worker's Compensation | Source: Ambulatory Visit | Attending: Orthopedic Surgery | Admitting: Orthopedic Surgery

## 2016-05-05 ENCOUNTER — Encounter
Admission: RE | Disposition: A | Discharge status: Home / Self Care | Payer: Self-pay | Source: Ambulatory Visit | Attending: Orthopedic Surgery

## 2016-05-05 DIAGNOSIS — M6752 Plica syndrome, left knee: Secondary | ICD-10-CM | POA: Insufficient documentation

## 2016-05-05 DIAGNOSIS — M2242 Chondromalacia patellae, left knee: Secondary | ICD-10-CM | POA: Insufficient documentation

## 2016-05-05 DIAGNOSIS — Z794 Long term (current) use of insulin: Secondary | ICD-10-CM | POA: Diagnosis not present

## 2016-05-05 DIAGNOSIS — Z791 Long term (current) use of non-steroidal anti-inflammatories (NSAID): Secondary | ICD-10-CM | POA: Insufficient documentation

## 2016-05-05 DIAGNOSIS — M1712 Unilateral primary osteoarthritis, left knee: Secondary | ICD-10-CM | POA: Diagnosis not present

## 2016-05-05 DIAGNOSIS — Z8614 Personal history of Methicillin resistant Staphylococcus aureus infection: Secondary | ICD-10-CM | POA: Insufficient documentation

## 2016-05-05 DIAGNOSIS — M65862 Other synovitis and tenosynovitis, left lower leg: Secondary | ICD-10-CM | POA: Diagnosis not present

## 2016-05-05 DIAGNOSIS — Z87891 Personal history of nicotine dependence: Secondary | ICD-10-CM | POA: Insufficient documentation

## 2016-05-05 DIAGNOSIS — Z7952 Long term (current) use of systemic steroids: Secondary | ICD-10-CM | POA: Insufficient documentation

## 2016-05-05 DIAGNOSIS — E039 Hypothyroidism, unspecified: Secondary | ICD-10-CM | POA: Insufficient documentation

## 2016-05-05 DIAGNOSIS — Z6841 Body Mass Index (BMI) 40.0 and over, adult: Secondary | ICD-10-CM | POA: Insufficient documentation

## 2016-05-05 DIAGNOSIS — X58XXXA Exposure to other specified factors, initial encounter: Secondary | ICD-10-CM | POA: Insufficient documentation

## 2016-05-05 DIAGNOSIS — I129 Hypertensive chronic kidney disease with stage 1 through stage 4 chronic kidney disease, or unspecified chronic kidney disease: Secondary | ICD-10-CM | POA: Insufficient documentation

## 2016-05-05 DIAGNOSIS — E1140 Type 2 diabetes mellitus with diabetic neuropathy, unspecified: Secondary | ICD-10-CM | POA: Diagnosis not present

## 2016-05-05 DIAGNOSIS — E1122 Type 2 diabetes mellitus with diabetic chronic kidney disease: Secondary | ICD-10-CM | POA: Insufficient documentation

## 2016-05-05 DIAGNOSIS — N189 Chronic kidney disease, unspecified: Secondary | ICD-10-CM | POA: Diagnosis not present

## 2016-05-05 DIAGNOSIS — Z79899 Other long term (current) drug therapy: Secondary | ICD-10-CM | POA: Diagnosis not present

## 2016-05-05 DIAGNOSIS — E669 Obesity, unspecified: Secondary | ICD-10-CM | POA: Insufficient documentation

## 2016-05-05 DIAGNOSIS — S83232A Complex tear of medial meniscus, current injury, left knee, initial encounter: Secondary | ICD-10-CM | POA: Diagnosis not present

## 2016-05-05 HISTORY — PX: KNEE ARTHROSCOPY: SHX127

## 2016-05-05 LAB — APTT: aPTT: 30 seconds (ref 24–36)

## 2016-05-05 LAB — GLUCOSE, CAPILLARY
Glucose-Capillary: 75 mg/dL (ref 65–99)
Glucose-Capillary: 78 mg/dL (ref 65–99)

## 2016-05-05 LAB — PROTIME-INR
INR: 1.03
Prothrombin Time: 13.5 seconds (ref 11.4–15.2)

## 2016-05-05 SURGERY — ARTHROSCOPY, KNEE
Anesthesia: General | Site: Knee | Laterality: Left | Wound class: Clean

## 2016-05-05 MED ORDER — OXYCODONE HCL 5 MG PO TABS: 5.0000 mg | ORAL_TABLET | Freq: Once | ORAL | Status: DC | PRN

## 2016-05-05 MED ORDER — ASPIRIN EC 325 MG PO TBEC: 325.0000 mg | DELAYED_RELEASE_TABLET | Freq: Every day | ORAL | 0 refills | Status: DC

## 2016-05-05 MED ORDER — PROPOFOL 10 MG/ML IV BOLUS
INTRAVENOUS | Status: DC | PRN
  Administered 2016-05-05: 70 mg via INTRAVENOUS
  Administered 2016-05-05: 40 mg via INTRAVENOUS
  Administered 2016-05-05: 160 mg via INTRAVENOUS

## 2016-05-05 MED ORDER — BUPIVACAINE-EPINEPHRINE (PF) 0.25% -1:200000 IJ SOLN
INTRAMUSCULAR | Status: DC | PRN
  Administered 2016-05-05: 20 mL via PERINEURAL

## 2016-05-05 MED ORDER — CEFAZOLIN SODIUM-DEXTROSE 2-4 GM/100ML-% IV SOLN
INTRAVENOUS | Status: AC
  Filled 2016-05-05: qty 100

## 2016-05-05 MED ORDER — DEXAMETHASONE SODIUM PHOSPHATE 10 MG/ML IJ SOLN
INTRAMUSCULAR | Status: DC | PRN
  Administered 2016-05-05: 10 mg via INTRAVENOUS

## 2016-05-05 MED ORDER — ONDANSETRON HCL 4 MG/2ML IJ SOLN
INTRAMUSCULAR | Status: DC | PRN
  Administered 2016-05-05: 4 mg via INTRAVENOUS

## 2016-05-05 MED ORDER — HYDROCODONE-ACETAMINOPHEN 5-325 MG PO TABS: 1.0000 | ORAL_TABLET | ORAL | 0 refills | Status: DC | PRN

## 2016-05-05 MED ORDER — OXYCODONE HCL 5 MG/5ML PO SOLN: 5.0000 mg | Freq: Once | ORAL | Status: DC | PRN

## 2016-05-05 MED ORDER — SODIUM CHLORIDE 0.9 % IV SOLN
INTRAVENOUS | Status: DC
  Administered 2016-05-05: 11:00:00 via INTRAVENOUS

## 2016-05-05 MED ORDER — CEFAZOLIN SODIUM-DEXTROSE 2-4 GM/100ML-% IV SOLN
2.0000 g | INTRAVENOUS | Status: AC
  Administered 2016-05-05: 2 g via INTRAVENOUS

## 2016-05-05 MED ORDER — MEPERIDINE HCL 25 MG/ML IJ SOLN
6.2500 mg | INTRAMUSCULAR | Status: DC | PRN
Start: 2016-05-05 — End: 2016-05-05

## 2016-05-05 MED ORDER — FENTANYL CITRATE (PF) 100 MCG/2ML IJ SOLN
INTRAMUSCULAR | Status: DC | PRN
  Administered 2016-05-05 (×2): 25 ug via INTRAVENOUS
  Administered 2016-05-05: 50 ug via INTRAVENOUS

## 2016-05-05 MED ORDER — FAMOTIDINE 20 MG PO TABS
ORAL_TABLET | ORAL | Status: AC
  Filled 2016-05-05: qty 1

## 2016-05-05 MED ORDER — PHENYLEPHRINE HCL 10 MG/ML IJ SOLN
INTRAMUSCULAR | Status: DC | PRN
  Administered 2016-05-05 (×2): 100 ug via INTRAVENOUS

## 2016-05-05 MED ORDER — CHLORHEXIDINE GLUCONATE 4 % EX LIQD: 60.0000 mL | Freq: Once | CUTANEOUS | Status: DC

## 2016-05-05 MED ORDER — GLYCOPYRROLATE 0.2 MG/ML IJ SOLN
INTRAMUSCULAR | Status: DC | PRN
  Administered 2016-05-05: 0.2 mg via INTRAVENOUS

## 2016-05-05 MED ORDER — ONDANSETRON HCL 4 MG PO TABS: 4.0000 mg | ORAL_TABLET | Freq: Three times a day (TID) | ORAL | 0 refills | Status: DC | PRN

## 2016-05-05 MED ORDER — PHENYLEPHRINE HCL 10 MG/ML IJ SOLN
INTRAMUSCULAR | Status: DC | PRN
  Administered 2016-05-05: 10 ug/min via INTRAVENOUS

## 2016-05-05 MED ORDER — FENTANYL CITRATE (PF) 100 MCG/2ML IJ SOLN: 25.0000 ug | INTRAMUSCULAR | Status: DC | PRN

## 2016-05-05 MED ORDER — LIDOCAINE HCL (PF) 1 % IJ SOLN
INTRAMUSCULAR | Status: AC
  Filled 2016-05-05: qty 30

## 2016-05-05 MED ORDER — PROMETHAZINE HCL 25 MG/ML IJ SOLN
6.2500 mg | INTRAMUSCULAR | Status: DC | PRN
Start: 2016-05-05 — End: 2016-05-05

## 2016-05-05 MED ORDER — BUPIVACAINE-EPINEPHRINE (PF) 0.25% -1:200000 IJ SOLN
INTRAMUSCULAR | Status: AC
  Filled 2016-05-05: qty 30

## 2016-05-05 MED ORDER — LIDOCAINE HCL (PF) 1 % IJ SOLN
INTRAMUSCULAR | Status: DC | PRN
  Administered 2016-05-05: 9 mL

## 2016-05-05 MED ORDER — FAMOTIDINE 20 MG PO TABS
20.0000 mg | ORAL_TABLET | Freq: Once | ORAL | Status: AC
  Administered 2016-05-05: 20 mg via ORAL

## 2016-05-05 MED ORDER — MIDAZOLAM HCL 2 MG/2ML IJ SOLN
INTRAMUSCULAR | Status: DC | PRN
  Administered 2016-05-05: 1 mg via INTRAVENOUS

## 2016-05-05 SURGICAL SUPPLY — 37 items
BUR RADIUS 3.5 (BURR) ×3 IMPLANT
BUR RADIUS 4.0X18.5 (BURR) ×3 IMPLANT
CLOSURE WOUND 1/2 X4 (GAUZE/BANDAGES/DRESSINGS) ×1
COOLER POLAR GLACIER W/PUMP (MISCELLANEOUS) IMPLANT
CUFF TOURN 24 STER (MISCELLANEOUS) IMPLANT
CUFF TOURN 30 STER DUAL PORT (MISCELLANEOUS) ×3 IMPLANT
DRAPE IMP U-DRAPE 54X76 (DRAPES) ×3 IMPLANT
DURAPREP 26ML APPLICATOR (WOUND CARE) ×9 IMPLANT
GAUZE PETRO XEROFOAM 1X8 (MISCELLANEOUS) ×3 IMPLANT
GAUZE SPONGE 4X4 12PLY STRL (GAUZE/BANDAGES/DRESSINGS) ×3 IMPLANT
GLOVE BIOGEL PI IND STRL 9 (GLOVE) ×1 IMPLANT
GLOVE BIOGEL PI INDICATOR 9 (GLOVE) ×2
GLOVE SURG 9.0 ORTHO LTXF (GLOVE) ×6 IMPLANT
GOWN STRL REUS TWL 2XL XL LVL4 (GOWN DISPOSABLE) ×3 IMPLANT
GOWN STRL REUS W/ TWL LRG LVL3 (GOWN DISPOSABLE) ×2 IMPLANT
GOWN STRL REUS W/TWL LRG LVL3 (GOWN DISPOSABLE) ×6
IV LACTATED RINGER IRRG 3000ML (IV SOLUTION) ×24
IV LR IRRIG 3000ML ARTHROMATIC (IV SOLUTION) ×8 IMPLANT
KIT RM TURNOVER STRD PROC AR (KITS) ×3 IMPLANT
MANIFOLD NEPTUNE II (INSTRUMENTS) ×3 IMPLANT
PACK ARTHROSCOPY KNEE (MISCELLANEOUS) ×3 IMPLANT
PAD ABD DERMACEA PRESS 5X9 (GAUZE/BANDAGES/DRESSINGS) ×6 IMPLANT
PAD WRAPON POLAR KNEE (MISCELLANEOUS) IMPLANT
PADDING CAST 4IN STRL (MISCELLANEOUS) ×2
PADDING CAST BLEND 4X4 STRL (MISCELLANEOUS) ×1 IMPLANT
SET TUBE SUCT SHAVER OUTFL 24K (TUBING) ×3 IMPLANT
SET TUBE TIP INTRA-ARTICULAR (MISCELLANEOUS) ×3 IMPLANT
SOL PREP PVP 2OZ (MISCELLANEOUS)
SOLUTION PREP PVP 2OZ (MISCELLANEOUS) IMPLANT
STRIP CLOSURE SKIN 1/2X4 (GAUZE/BANDAGES/DRESSINGS) ×2 IMPLANT
SUT ETHILON 4-0 (SUTURE) ×3
SUT ETHILON 4-0 FS2 18XMFL BLK (SUTURE) ×1
SUTURE ETHLN 4-0 FS2 18XMF BLK (SUTURE) ×1 IMPLANT
TUBING ARTHRO INFLOW-ONLY STRL (TUBING) ×3 IMPLANT
WAND HAND CNTRL MULTIVAC 50 (MISCELLANEOUS) IMPLANT
WAND HAND CNTRL MULTIVAC 90 (MISCELLANEOUS) ×3 IMPLANT
WRAPON POLAR PAD KNEE (MISCELLANEOUS)

## 2016-05-05 NOTE — Transfer of Care (Signed)
Immediate Anesthesia Transfer of Care Note  Patient: Courtney [name]  Procedure(s) Performed: Procedure(s): ARTHROSCOPY KNEE (Left)  Patient Location: PACU  Anesthesia Type:General  Level of Consciousness: sedated and responds to stimulation  Airway & Oxygen Therapy: Patient Spontanous Breathing and Patient connected to face mask oxygen  Post-op Assessment: Report given to RN and Post -op Vital signs reviewed and stable  Post vital signs: Reviewed and stable  Last Vitals:  Vitals:   05/05/16 1007 05/05/16 1344  BP: (!) 145/72 (!) 101/48  Pulse: 84 72  Resp: 18 18  Temp: 36.7 C 36.5 C    Last Pain:  Vitals:   05/05/16 1007  TempSrc: Oral         Complications: No apparent anesthesia complications

## 2016-05-05 NOTE — Anesthesia Procedure Notes (Signed)
Procedure Name: LMA Insertion Date/Time: 05/05/2016 12:12 PM Performed by: Jonna Clark Pre-anesthesia Checklist: Patient identified, Patient being monitored, Timeout performed, Emergency Drugs available and Suction available Patient Re-evaluated:Patient Re-evaluated prior to inductionOxygen Delivery Method: Circle system utilized Preoxygenation: Pre-oxygenation with 100% oxygen Intubation Type: IV induction Ventilation: Mask ventilation without difficulty LMA: LMA inserted LMA Size: 4.0 Tube type: Oral Number of attempts: 1 Placement Confirmation: positive ETCO2 and breath sounds checked- equal and bilateral Tube secured with: Tape Dental Injury: Teeth and Oropharynx as per pre-operative assessment

## 2016-05-05 NOTE — H&P (Signed)
The patient has been re-examined, and the chart reviewed, and there have been no interval changes to the documented history and physical.    The risks, benefits, and alternatives have been discussed at length, and the patient is willing to proceed.   

## 2016-05-05 NOTE — Anesthesia Preprocedure Evaluation (Signed)
Anesthesia Evaluation  Patient identified by MRN, date of birth, ID band Patient awake    Reviewed: Allergy & Precautions, NPO status , Patient's Chart, lab work & pertinent test results  History of Anesthesia Complications Negative for: history of anesthetic complications  Airway Mallampati: III  TM Distance: >3 FB Neck ROM: Full    Dental no notable dental hx.    Pulmonary neg sleep apnea, neg COPD, former smoker,    breath sounds clear to auscultation- rhonchi (-) wheezing      Cardiovascular Exercise Tolerance: Good hypertension, Pt. on medications (-) CAD and (-) Past MI  Rhythm:Regular Rate:Normal - Systolic murmurs and - Diastolic murmurs    Neuro/Psych negative neurological ROS  negative psych ROS   GI/Hepatic negative GI ROS, Neg liver ROS,   Endo/Other  diabetes, Type 2, Insulin DependentHypothyroidism   Renal/GU CRFRenal disease     Musculoskeletal  (+) Arthritis , Osteoarthritis,    Abdominal (+) + obese,   Peds  Hematology  (+) anemia ,   Anesthesia Other Findings Past Medical History: No date: Anemia No date: Arthritis No date: Chronic kidney disease No date: Diabetes mellitus without complication ([place]) No date: Hypertension 2013: MRSA infection     Comment: on leg; surgery  No date: Neuropathy due to secondary diabetes ([place]) No date: Thyroid disease   Reproductive/Obstetrics                             Anesthesia Physical Anesthesia Plan  ASA: III  Anesthesia Plan: General   Post-op Pain Management:    Induction: Intravenous  Airway Management Planned: LMA  Additional Equipment:   Intra-op Plan:   Post-operative Plan:   Informed Consent: I have reviewed the patients History and Physical, chart, labs and discussed the procedure including the risks, benefits and alternatives for the proposed anesthesia with the patient or authorized representative who has  indicated his/her understanding and acceptance.   Dental advisory given  Plan Discussed with: CRNA and Anesthesiologist  Anesthesia Plan Comments:         Anesthesia Quick Evaluation

## 2016-05-05 NOTE — Discharge Instructions (Signed)
Patient will use crutches for the next few days until they is able to walk without a limp. Patient may weight-bear as tolerated after 2-3 days. Patiient should elevate and ice his leg whenever possible over the next 3 days. Patient should keep the surgical dressing covered during showers for 3 days with either a plastic bag or Saran wrap. The bandage may be removed after 3 days. Leave Steri-Strips in place. Take enteric-coated aspirin 325 mg by mouth daily 6 weeks postop for DVT prophylaxis.  AMBULATORY SURGERY  DISCHARGE INSTRUCTIONS   1) The drugs that you were given will stay in your system until tomorrow so for the next 24 hours you should not:  A) Drive an automobile B) Make any legal decisions C) Drink any alcoholic beverage   2) You may resume regular meals tomorrow.  Today it is better to start with liquids and gradually work up to solid foods.  You may eat anything you prefer, but it is better to start with liquids, then soup and crackers, and gradually work up to solid foods.   3) Please notify your doctor immediately if you have any unusual bleeding, trouble breathing, redness and pain at the surgery site, drainage, fever, or pain not relieved by medication.    4) Additional Instructions:        Please contact your physician with any problems or Same Day Surgery at [phone], Monday through Friday 6 am to 4 pm, or [place] at [facility] number at [phone].

## 2016-05-05 NOTE — Anesthesia Postprocedure Evaluation (Signed)
Anesthesia Post Note  Patient: Courtney [name]  Procedure(s) Performed: Procedure(s) (LRB): ARTHROSCOPY KNEE (Left)  Patient location during evaluation: PACU Anesthesia Type: General Level of consciousness: awake and alert and oriented Pain management: pain level controlled Vital Signs Assessment: post-procedure vital signs reviewed and stable Respiratory status: spontaneous breathing, nonlabored ventilation and respiratory function stable Cardiovascular status: blood pressure returned to baseline and stable Postop Assessment: no signs of nausea or vomiting Anesthetic complications: no    Last Vitals:  Vitals:   05/05/16 1429 05/05/16 1444  BP: (!) 99/47 116/61  Pulse: 74 76  Resp: 14 14  Temp:  36.7 C    Last Pain:  Vitals:   05/05/16 1444  TempSrc:   PainSc: 1                  Rodert Hinch

## 2016-05-05 NOTE — Op Note (Signed)
PATIENT:  Courtney [name]  PRE-OPERATIVE DIAGNOSIS:  TEAR OF MEDIAL MENISCUS, LEFT KNEE   POST-OPERATIVE DIAGNOSIS: TEAR OF MEDIAL MENISCUS, MEDIAL PLICA AND OSTEOARTHRTITIS OF PATELLA AND MEDIAL COMPARTMENT, LEFT KNEE   PROCEDURE:  LEFT KNEE ARTHROSCOPY WITH  Partial MEDIAL MENISECTOMY, SYNOVECTOMY, DEBRIDEMENT OF PLICA AND CHONDROPLASTY OF THE MEDIAL FEMORAL CONDYLE AND PATELLA  SURGEON:  Thornton Park, MD  ANESTHESIA:   General  PREOPERATIVE INDICATIONS:  Courtney [name]  60 y.o. female with a diagnosis of TEAR OF MEDIAL MENISCUS of the left knee who failed conservative management and elected for surgical management.  Her knee pain affects her ability to ambulate comfortably, perform activities of daily living and perform her duties at work.  The risks benefits and alternatives were discussed with the patient preoperatively including the risks of infection, bleeding, nerve injury, knee stiffness, persistent pain, osteoarthritis and the need for further surgery. Medical  risks include DVT and pulmonary embolism, myocardial infarction, stroke, pneumonia, respiratory failure and death. The patient understood these risks and wished to proceed.   OPERATIVE FINDINGS: Complex tear posterior horn of the medial meniscus, diffuse high-grade chondromalacia of the weightbearing surface of the medial femoral condyle and undersurface of the patella. Inflamed and thickened medial plica with anterior synovitis.  OPERATIVE PROCEDURE: Patient was met in the preoperative area. The left extremity was signed with the word yes and my initials according the hospital's correct site of surgery protocol.  The patient was brought to the operating room where she was placed supine on the operative table. General anesthesia was administered. The patient was prepped and draped in a sterile fashion.  A timeout was performed to verify the patient's name, date of birth, medical record number, correct site of surgery  correct procedure to be performed. It was also used to verify the patient had received antibiotics that all appropriate instruments, and radiographic studies were available in the room. Once all in attendance were in agreement, the case began.  Proposed arthroscopy incisions were drawn out with a surgical marker. These were pre-injected with 1% lidocaine plain. An 11 blade was used to establish an inferior lateral and inferomedial portals. The inferomedial portal was created using a 18-gauge spinal needle under direct visualization.  A full diagnostic examination of the knee was performed including the suprapatellar pouch, patellofemoral joint, medial lateral compartments as well as the medial lateral gutters, the intercondylar notch in the posterior knee.  Findings on arthroscopy included a complex tear of the medial meniscus, 2 large areas of high-grade chondromalacia of the medial femoral condyle and undersurface of the patella.  Patient had a thickened and erythematous medial plica and synovitis in the anterior aspect of the knee.  A synovectomy was performed of the anterior knee to allow for better visualization. A 90 ArthroCare wand and 4.0 resector shaver blade were used for this purpose. These instruments were also used to debride a thickened and erythematous medial plica.   Patient had the complex medial meniscal tear treated with a 4-0 resector shaver blade and straight duckbill basket. The meniscus was debrided until a stable rim was achieved. A chondroplasty of the medial femoral condyle and undersurface of patella was also performed using a 4-0 resector shaver blade.   The knee was then copiously lavaged to remove all cartilage and meniscal debris. All arthroscopic instruments were removed. The two arthroscopy portals were closed with 4-0 nylon. Steri-Strips were applied along with a dry sterile and compressive dressing. Patient's left knee was injected with quarter percent Marcaine  with  epi for a total of 20 cc to assist with postop pain control. The patient was brought to the PACU in stable condition. I was scrubbed and present for the entire case and all sharp and instrument counts were correct at the conclusion the case. I spoke to the patient's family postoperatively to let them know the case was performed without complication and the patient was stable in the recovery room.   Timoteo Gaul, MD

## 2016-05-06 ENCOUNTER — Ambulatory Visit: Payer: BLUE CROSS/BLUE SHIELD | Admitting: Internal Medicine

## 2016-05-23 ENCOUNTER — Telehealth: Payer: Self-pay | Admitting: Internal Medicine

## 2016-05-24 ENCOUNTER — Telehealth: Payer: Self-pay

## 2016-05-24 NOTE — Telephone Encounter (Signed)
Pt does not have insurance anymore so the tresiba is now unaffordable, please advise  Is there pt assistance?

## 2016-05-24 NOTE — Telephone Encounter (Signed)
Called patient and notified of patient assistance program. I placed up front with her name on it for her to pick up and fill out to bring back and we would fill out to fax over. Patient understood.

## 2016-05-24 NOTE — Telephone Encounter (Signed)
Sure, let's do this.

## 2016-06-07 NOTE — Telephone Encounter (Signed)
Called to let patient know that her tresiba was here and she stated she would come to pick it up today

## 2016-08-29 ENCOUNTER — Encounter: Payer: Self-pay | Admitting: Internal Medicine

## 2016-08-29 ENCOUNTER — Ambulatory Visit (INDEPENDENT_AMBULATORY_CARE_PROVIDER_SITE_OTHER): Payer: BLUE CROSS/BLUE SHIELD | Admitting: Internal Medicine

## 2016-08-29 VITALS — BP 134/90 | HR 80 | Ht 67.0 in | Wt 261.2 lb

## 2016-08-29 DIAGNOSIS — E1121 Type 2 diabetes mellitus with diabetic nephropathy: Secondary | ICD-10-CM

## 2016-08-29 DIAGNOSIS — E1165 Type 2 diabetes mellitus with hyperglycemia: Secondary | ICD-10-CM

## 2016-08-29 DIAGNOSIS — IMO0002 Reserved for concepts with insufficient information to code with codable children: Secondary | ICD-10-CM

## 2016-08-29 DIAGNOSIS — C73 Malignant neoplasm of thyroid gland: Secondary | ICD-10-CM | POA: Diagnosis not present

## 2016-08-29 DIAGNOSIS — E890 Postprocedural hypothyroidism: Secondary | ICD-10-CM | POA: Diagnosis not present

## 2016-08-29 LAB — T4, FREE: Free T4: 1.3 ng/dL (ref 0.60–1.60)

## 2016-08-29 LAB — TSH: TSH: 1.82 u[IU]/mL (ref 0.35–4.50)

## 2016-08-29 MED ORDER — INSULIN PEN NEEDLE 32G X 4 MM MISC: 3 refills | Status: DC

## 2016-08-29 MED ORDER — INSULIN SYRINGE-NEEDLE U-100 31G X 15/64" 0.5 ML MISC
11 refills | Status: DC
Start: 2016-08-29 — End: 2016-12-07

## 2016-08-29 MED ORDER — INSULIN REGULAR HUMAN 100 UNIT/ML IJ SOLN: INTRAMUSCULAR | 11 refills | Status: DC

## 2016-08-29 NOTE — Progress Notes (Signed)
Patient ID: Courtney [name], female   DOB: November 05, 1955, 60 y.o.   MRN: [id number]  HPI: Courtney [name] is a 60 y.o.-year-old female, initially referred by her PCP, Dr. 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 Last visit 7 months ago.  She was terminated at her job >> no insurance after the first of the year.   She had labs 2 days ago by PCP. We called to obtain these records, but could not get them during the appointment.  DM2: Last hemoglobin A1c was: Lab Results  Component Value Date   HGBA1C 8.2 (H) 04/13/2016   HGBA1C 9.1 02/05/2016   HGBA1C 9.8 (H) 09/18/2015  08/01/2014: HbA1c 14.5% 05/02/2014: HbA1c 14.8% 01/31/2014: HbA1c 13.6% 11/01/2013: HbA1c 15.1%  Pt was on a regimen of: - Novolin 70/30 48 units 2x a day 15 min before the meals - but may forget - misses 5/14 doses a week - Amaryl 8 mg in am She was on Metformin >> stopped 2/2 CKD.  At last visit, we switched to: - Tresiba U200 70 units in am - ReliOn Novolin to 2-3x a day: - before breakfast and dinner:  20 units with a regular meal  25 units before a larger meal  (15 units before lunch if you eat lunch) Stopped Metformin 500 mg 2x a day - b/c leg swelling.  She is not checking sugars since 06/2016 as she had no strips - no log. From last visit: - am: 135-140 >> 233-246, 299 >> 145-228 >> 163-351 >> 94-162, 218, 247 >> 80-189, 238, 264 (sick) - 2h after b'fast: n/c - before lunch: n/c >> 72-149 >> n/c >> 81-134 >> 148, 170 - 2h after lunch: n/c >> 67, 228 >> n/c - before dinner: n/c >> 65-37, 190-207 >> 135-140 >> 240s >> n/c >> 142-162 >> n/c - 2h after dinner: n/c >> 131, 269 >> n/c - bedtime: n/c >> 81-160 >> n/c >> 240s >> n/c - nighttime: n/c No lows. Lowest sugar was 98 >> 233 >> 138 >> 81 >> 80; ? hypoglycemia awareness Highest sugar was 140s  >> 299 >> 200s >> 351 >> 247 >> 264.  Glucometer: Prodigy  Pt's meals are: - Breakfast: varies - mostly eats out - may skip. Oatmeal, sausage or bacon, apples - Lunch: peanuts, sandwich + crystal lite drink - Dinner: frozen dinner - Snacks: fruit, small bag potato chips, peanuts  - + stage 3 CKD, BUN/creatinine:  03/18/2016: BUN/creatinine 16/1.21, EGFR 57, Calcium 8.7, AST/ALT 17/24 09/18/2015: BUN/creatinine 14/1.2, GFR 57 (previously 55) 08/01/2014: 15/1.26, GFR 55 - lipids: 03/18/2016: Lipids: 156/85/70/69 09/18/2015: 160/57/87/62 08/01/2014: 145/78/74/55 - last eye exam was in 06/2016. 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  - no numbness and tingling in her feet.  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  - She did not have RAI treatment at that time, however, she was found to have a thyroid mass in 2005 >> This was biopsied and returned as normal thyroid tissue, but had RAI treatment at that time.  - The posttreatment whole-body scan was negative for any cancer spread.   Thyroglobulin returned at 2.3  in 08/2014 >> I ordered a neck ultrasound that showed a stable nodule with a fatty hilum, consistent with a benign lymph node, but no other masses in the surgical bed.   The thyroglobulin level remained detectable, and was actually 9.9 at last visit. I ordered a neck ultrasound (11/2015) and this showed a stable 1.4 nodule in the thyroid bed, consistent with a benign process.  Latest labs: Component     Latest Ref Rng 08/22/2014 12/26/2014 09/18/2015 11/06/2015  Thyroglobulin     2.8 - 40.9 ng/mL 2.3 (L)  2.4 (L)   Thyroglobulin Ab     <2 IU/mL <1  <1   Thyroglobulin Antibody     0.0 - 0.9 IU/mL <1.0 <1.0     Thyroglobulin by IMA     1.5 - 38.5 ng/mL  0.8 (L)    THYROGLOBULIN (TG-RIA)         9.9   Postsurgical hypothyroidism: Last TSH:  Lab Results  Component Value Date   TSH 1.03 09/18/2015   TSH 2.72 12/26/2014   FREET4 1.13 09/18/2015   FREET4 1.00 12/26/2014  08/01/2014: TSH 1.89  She is on LT4 150 mcg daily: - in am, before her dinner - with water - eats b'fast 30 min later - No calcium, iron, PPI, MVI  ROS: Constitutional: no weight gain/loss, no fatigue, no subjective hypothermia Eyes: no blurry vision, no xerophthalmia ENT: no sore throat, see history of present illness, no hoarseness Cardiovascular: no CP/SOB/palpitations/leg swelling Respiratory: no cough/SOB Gastrointestinal: no N/V/D/C Musculoskeletal: no muscle/joint aches Skin: no rashes, + hair loss Neurological: no tremors/numbness/tingling/dizziness  I reviewed pt's medications, allergies, PMH, social hx, family hx, and changes were documented in the history of present illness. Otherwise, unchanged from my initial visit note.  Past Medical History:  Diagnosis Date  . Anemia   . Arthritis   . Chronic kidney disease   . Diabetes mellitus without complication ([place])   . Hypertension   . MRSA infection 2013   on leg; surgery   . Neuropathy due to secondary diabetes ([place])   . Thyroid disease    Past Surgical History:  Procedure Laterality Date  . BILATERAL CARPAL TUNNEL RELEASE Bilateral 2003  . CATARACT EXTRACTION W/ INTRAOCULAR LENS IMPLANT Left 2016  . CHOLECYSTECTOMY    . 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 EYE SURGERY    . KNEE ARTHROSCOPY Left 05/05/2016   Procedure: ARTHROSCOPY KNEE;  Surgeon: Thornton Park, MD;  Location: [facility] ORS;  Service: Orthopedics;  Laterality: Left;  . SHOULDER ARTHROSCOPY Left 2004  . THYROID SURGERY  2004  . TRIGGER FINGER RELEASE  2003   History   Social History Main Topics  . Smoking status: Former Research scientist (life sciences)  . Smokeless tobacco: Not on file  . Alcohol Use: No   . Drug Use: No   Social History Narrative   Single   0 children   [place]travel[place]      Beer and wine on occasion   First menstrual cycle: 6th grade   Postmenopausal      Current Outpatient Prescriptions  Medication Sig Dispense Refill  . amLODipine (NORVASC) 10 MG tablet Take 10 mg by mouth daily. Reported on 11/06/2015    . HYDROcodone-acetaminophen (NORCO) 5-325 MG tablet Take 1-2 tablets by mouth every 4 (four) hours as needed for moderate pain. MAXIMUM TOTAL ACETAMINOPHEN DOSE IS 4000 MG PER DAY 40 tablet 0  . levothyroxine (SYNTHROID, LEVOTHROID) 150 MCG tablet Take 150 mcg by mouth daily before  breakfast.     . losartan (COZAAR) 100 MG tablet Take 100 mg by mouth daily.     Marland Kitchen NOVOLIN R RELION 100 UNIT/ML injection INJECT 20-25 UNITS SUB-Q THREE TIMES DAILY BEFORE MEALS (Patient taking differently: INJECT 20-25 UNITS SUB-Q Twice TIMES DAILY BEFORE MEALS) 10 mL 2  . ondansetron (ZOFRAN) 4 MG tablet Take 1 tablet (4 mg total) by mouth every 8 (eight) hours as needed for nausea or vomiting. 30 tablet 0  . prednisoLONE acetate (PRED FORTE) 1 % ophthalmic suspension Place 1 drop into the left eye daily.   5  . TRESIBA FLEXTOUCH 200 UNIT/ML SOPN INJECT 80 UNITS SUBCUTANEOUSLY AT BEDTIME (Patient taking differently: INJECT 70 UNITS SUBCUTANEOUSLY MORNING) 9 pen 1  . Vitamin D, Ergocalciferol, (DRISDOL) 50000 UNITS CAPS capsule Take 50,000 Units by mouth every 7 (seven) days.   1  . metFORMIN (GLUCOPHAGE) 500 MG tablet TAKE ONE TABLET BY MOUTH TWICE DAILY WITH MEALS (Patient not taking: Reported on 08/29/2016) 60 tablet 0   No current facility-administered medications for this visit.    NKDA   Family History  Problem Relation Age of Onset  . Hypertension Mother   . Thyroid disease Mother   . CVA Mother   . Hypertension Sister   . Thyroid disease Sister   . Hypertension Brother   . Hyperlipidemia Brother    PE: BP 134/90 (BP Location: Right Arm, Patient  Position: Sitting, Cuff Size: Large)   Pulse 80   Ht '5\' 7"'  (1.702 m)   Wt 261 lb 3.2 oz (118.5 kg)   SpO2 96%   BMI 40.91 kg/m  Body mass index is 40.91 kg/m. Wt Readings from Last 3 Encounters:  08/29/16 261 lb 3.2 oz (118.5 kg)  05/05/16 259 lb (117.5 kg)  04/27/16 259 lb (117.5 kg)   Constitutional: overweight, in NAD Eyes: PERRLA, EOMI, no exophthalmos ENT: moist mucous membranes, no neck masses palpable, no cervical lymphadenopathy Cardiovascular: RRR, No MRG Respiratory: CTA B Gastrointestinal: abdomen soft, NT, ND, BS+ Musculoskeletal: no deformities, strength intact in all 4 Skin: moist, warm, no rashes Neurological: no tremor with outstretched hands, DTR normal in all 4  ASSESSMENT: 1. DM2, insulin-dependent, uncontrolled, with complications - CKD - DR  2. 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 She did not have RAI treatment at that time, however, she was found to have a thyroid mass in 2005. This was biopsied and returned as normal thyroid tissue. However, she had RAI treatment at that time. The posttreatment whole-body scan was negative for any cancer spread.   - Neck U/S (09/19/2014): 1. Post total thyroidectomy. 2. No change in the previously biopsied approximately 1.4 cm echogenic solid nodule within the inferior aspect of the right lobe of the thyroid, grossly unchanged since the 2005 examination. 3. Apparent development of an approximately 0.4 cm hypoechoic nodule within the right thyroidectomy bed - while too small for definitive characterization, this nodule appears to contain an echogenic hilum and thus is favored to represent a non pathologically enlarged cervical lymph node.  - Neck U/S (11/27/2015):  1. Surgical changes of prior total thyroidectomy. 2. Unchanged 1.4 cm echogenic soft  tissue nodule in the right thyroid resection bed. Continued stability over time consistent with a benign process.  3. Post surgical hypothyroidism  PLAN:  1. Patient with long-standing, uncontrolled diabetes, on basal-bolus insulin  regimen with Tyler Aas and Novolin R but now off Metformin because of leg swelling. Her swelling is better after stopping the metformin. - She does not bring the log that she did not check her sugars since last time since she could not afford strips - Without CBG values and without an A1c, I cannot make any changes in her regimen at this point. I encouraged her to start checking her sugars and contact me through my chart so we can make appropriate changes in her insulin regimen if needed. - will continue:  Patient Instructions  Please continue: - Tresiba U200 70 units in am - ReliOn Novolin to 3x a day: - before breakfast and dinner:  20 units with a regular meal  25 units with a larger meal - before lunch:   15 units   Do not skip doses of insulin!  Please return in 3 months with your sugar log.   - refused flu vaccine at last visits - advised for yearly eye exams >> she is up-to-date - Return in about 3 months (around 11/27/2016).  2. 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 On her last thyroid ultrasound obtained after last visit >> there is a 1.4 cm mass in the thyroid bed, stable from 2005 - reviewed Tg (detectable) >> will repeat at this visit.  3. Postsurgical hypothyroidism - She is taking her levothyroxine correctly  - We reviewed last set of her TFTs from 09/2015 >> normal - Continue LT4 150 mcg daily. - Will recheck TFTs today Patient Instructions  Please continue Levothyroxine 150 mcg daily.  Take the thyroid hormone every day, with  water, at least 30 minutes before breakfast, separated by at least 4 hours from: - acid reflux medications - calcium - iron - multivitamins  [name], MD PhD [facility] Endocrinology  Component     Latest Ref Rng & Units 08/29/2016  Thyroglobulin     ng/mL 2.5 (L)  Thyroglobulin Ab     <2 IU/mL <1  TSH     0.35 - 4.50 uIU/mL 1.82  T4,Free(Direct)     0.60 - 1.60 ng/dL 1.30   Normal TFTs. Tg stable.

## 2016-08-29 NOTE — Patient Instructions (Addendum)
Please continue: - Tresiba U200 70 units in am - ReliOn Novolin  3x a day: - before breakfast and dinner:  20 units with a regular meal  25 units with a larger meal - before lunch:   15 units   Do not skip doses of insulin!  Take the thyroid hormone every day, with water, at least 30 minutes before breakfast, separated by at least 4 hours from: - acid reflux medications - calcium - iron - multivitamins  Please stop at the lab.  Please return in 3 months with your sugar log.

## 2016-08-30 LAB — THYROGLOBULIN ANTIBODY: Thyroglobulin Ab: <1 IU/mL (ref <2)

## 2016-08-30 LAB — THYROGLOBULIN LEVEL: Thyroglobulin: 2.5 ng/mL — ABNORMAL LOW

## 2016-09-02 ENCOUNTER — Encounter: Payer: Self-pay | Admitting: Internal Medicine

## 2016-09-02 NOTE — Progress Notes (Signed)
Reviewed latest labs on 08/22/2016: - HbA1c 11.8%, higher than expected and higher than at last visit - BUN/creatinine 20/1.41

## 2016-11-25 ENCOUNTER — Encounter: Payer: Self-pay | Admitting: Internal Medicine

## 2016-11-25 ENCOUNTER — Ambulatory Visit (INDEPENDENT_AMBULATORY_CARE_PROVIDER_SITE_OTHER): Payer: Self-pay | Admitting: Internal Medicine

## 2016-11-25 VITALS — BP 154/84 | HR 91 | Ht 67.0 in | Wt 270.0 lb

## 2016-11-25 DIAGNOSIS — IMO0002 Reserved for concepts with insufficient information to code with codable children: Secondary | ICD-10-CM

## 2016-11-25 DIAGNOSIS — E1165 Type 2 diabetes mellitus with hyperglycemia: Secondary | ICD-10-CM

## 2016-11-25 DIAGNOSIS — C73 Malignant neoplasm of thyroid gland: Secondary | ICD-10-CM

## 2016-11-25 DIAGNOSIS — E1121 Type 2 diabetes mellitus with diabetic nephropathy: Secondary | ICD-10-CM

## 2016-11-25 DIAGNOSIS — E890 Postprocedural hypothyroidism: Secondary | ICD-10-CM

## 2016-11-25 DIAGNOSIS — Z794 Long term (current) use of insulin: Secondary | ICD-10-CM

## 2016-11-25 LAB — POCT GLYCOSYLATED HEMOGLOBIN (HGB A1C): HEMOGLOBIN A1C: 9.5

## 2016-11-25 NOTE — Patient Instructions (Addendum)
Please continue: - Tresiba 70 units daily  Increase: -  ReliOn Novolin to 2-3x a day:  - before breakfast and dinner:  25 units before a smaller meal  30 units before a larger meal - before lunch:  15 units - if you eat lunch  Please add: - ReliOn Novolin sliding scale: - 150- 165: + 1 unit  - 166- 180: + 2 units  - 181- 195: + 3 units  - 196- 210: + 4 units  - 211- 225: + 5 units  - 226- 240: + 6 units  - 241- 255: + 7 units  - >255 + 8 units   Please return in 3 months with your sugar log.

## 2016-11-25 NOTE — Progress Notes (Signed)
Patient ID: Courtney [name], female   DOB: 1955-11-21, 61 y.o.   MRN: [id number]  HPI: Courtney [name] is a 61 y.o.-year-old female, initially referred by her PCP, Dr. 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 Last visit 3 months ago.  She was terminated at her job last year >> no insurance now.  She was in PT after L knee surgery.  DM2: Last hemoglobin A1c was: 08/22/2016: HbA1c 11.8% Lab Results  Component Value Date   HGBA1C 9.5 11/25/2016   HGBA1C 8.2 (H) 04/13/2016   HGBA1C 9.1 02/05/2016  08/01/2014: HbA1c 14.5% 05/02/2014: HbA1c 14.8% 01/31/2014: HbA1c 13.6% 11/01/2013: HbA1c 15.1%  She is on: - Tresiba U200 70 units in am - ReliOn Novolin to 2-3x a day: - before breakfast and dinner:  20 units with a regular meal  25 units before a larger meal  (15 units before lunch if you eat lunch) - not using Stopped Metformin 500 mg 2x a day - b/c leg swelling. She was on Novolin 70/30 48 units 2x a day 15 min before the meals  She was on Amaryl 8 mg in am She was on Metformin >> stopped 2/2 CKD.  She is checking sugars 1-2x a day: - am: 145-228 >> 163-351 >> 94-162, 218, 247 >> 80-189, 238, 264 (sick) >> 125-205, 230, 275 - 2h after b'fast: n/c - before lunch: n/c >> 72-149 >> n/c >> 81-134 >> 148, 170 >> 189 - 2h after lunch: n/c >> 67, 228 >> n/c - before dinner: n/c >> 65-37, 190-207 >> 135-140 >> 240s >> n/c >> 142-162 >> n/c >> 154-220 - 2h after dinner: n/c >> 131, 269 >> n/c - bedtime: n/c >> 81-160 >> n/c >> 240s >> n/c - nighttime: n/c No lows. Lowest sugar was 98 >> 233 >> 138 >> 81 >> 80 >> 125; ? hypoglycemia awareness Highest sugar was 140s >> 299 >> 200s >> 351 >> 247 >> 264 >> 275  Glucometer: Prodigy  Pt's meals are: - Breakfast: varies - mostly eats out - may skip. Oatmeal, sausage or bacon,  apples - Lunch: peanuts, sandwich + crystal lite drink - Dinner: frozen dinner - Snacks: fruit, small bag potato chips, peanuts  - + stage 3 CKD, BUN/creatinine:  08/22/2016: BUN/creatinine 20/1.41 03/18/2016: BUN/creatinine 16/1.21, EGFR 57, Calcium 8.7, AST/ALT 17/24 09/18/2015: BUN/creatinine 14/1.2, GFR 57 (previously 55) 08/01/2014: 15/1.26, GFR 55  - lipids: 03/18/2016: Lipids: 156/85/70/69 09/18/2015: 160/57/87/62 08/01/2014: 145/78/74/55  - last eye exam was in 10/2016, prev. 06/2016. 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  - no numbness and tingling in her feet.  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  - She did not have RAI treatment at that time, however, she was found to have a thyroid mass in 2005 >> This was biopsied and returned as normal thyroid tissue, but had RAI treatment at that time.  - The posttreatment whole-body scan was negative for any cancer spread.   Thyroglobulin returned at 2.3 in 08/2014 >> I ordered a neck ultrasound that showed a stable nodule with a fatty hilum, consistent with a benign lymph node, but no  other masses in the surgical bed.   The thyroglobulin level remained detectable, and was actually 9.9 in 10/2015.   A neck ultrasound (11/2015) showed a stable 1.4 nodule in the thyroid bed, consistent with a benign process.  Latest labs: Component     Latest Ref Rng & Units 12/26/2014 09/18/2015 11/06/2015 08/29/2016  Thyroglobulin by IMA     1.5 - 38.5 ng/mL 0.8 (L)     Thyroglobulin Ab     <2 IU/mL  <1  <1  Thyroglobulin     ng/mL  2.4 (L)  2.5 (L)  THYROGLOBULIN (TG-RIA)     ng/mL   9.9     Postsurgical hypothyroidism: Last TSH:  Lab Results  Component Value Date   TSH 1.82  08/29/2016   TSH 1.03 09/18/2015   TSH 2.72 12/26/2014   FREET4 1.30 08/29/2016   FREET4 1.13 09/18/2015   FREET4 1.00 12/26/2014  08/01/2014: TSH 1.89  She is on LT4 150 mcg daily: - in am, before her dinner - with water - eats b'fast 30 min later - No calcium, iron, PPI, MVI  ROS: Constitutional: no weight gain/loss, no fatigue, no subjective hypothermia, + poor sleep Eyes: no blurry vision, no xerophthalmia ENT: no sore throat, see history of present illness, no hoarseness Cardiovascular: no CP/SOB/palpitations/leg swelling Respiratory: no cough/SOB Gastrointestinal: no N/V/D/C Musculoskeletal: no muscle/+ joint aches Skin: no rashes, no hair loss Neurological: no tremors/numbness/tingling/dizziness  I reviewed pt's medications, allergies, PMH, social hx, family hx, and changes were documented in the history of present illness. Otherwise, unchanged from my initial visit note.  Past Medical History:  Diagnosis Date  . Anemia   . Arthritis   . Chronic kidney disease   . Diabetes mellitus without complication ([place])   . Hypertension   . MRSA infection 2013   on leg; surgery   . Neuropathy due to secondary diabetes ([place])   . Thyroid disease    Past Surgical History:  Procedure Laterality Date  . BILATERAL CARPAL TUNNEL RELEASE Bilateral 2003  . CATARACT EXTRACTION W/ INTRAOCULAR LENS IMPLANT Left 2016  . CHOLECYSTECTOMY    . 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 EYE SURGERY    . KNEE ARTHROSCOPY Left 05/05/2016   Procedure: ARTHROSCOPY KNEE;  Surgeon: Thornton Park, MD;  Location: [facility] ORS;  Service: Orthopedics;  Laterality: Left;  . SHOULDER ARTHROSCOPY Left 2004  . THYROID SURGERY  2004  . TRIGGER FINGER RELEASE  2003   History   Social History Main Topics  . Smoking status: Former Research scientist (life sciences)  . Smokeless tobacco: Not on file  . Alcohol Use: No  . Drug Use: No   Social History Narrative   Single   0 children   [place]travel[place]      Beer  and wine on occasion   First menstrual cycle: 6th grade   Postmenopausal      Current Outpatient Prescriptions  Medication Sig Dispense Refill  . amLODipine (NORVASC) 10 MG tablet Take 10 mg by mouth daily. Reported on 11/06/2015    . Insulin Pen Needle (CAREFINE PEN NEEDLES) 32G X 4 MM MISC Use 1x a day 100 each 3  . insulin regular (NOVOLIN R RELION) 250 units/2.93m (100 units/mL) injection INJECT 20-25 UNITS SUB-Q THREE TIMES DAILY BEFORE MEALS. ReliOn insulin. 10 mL 11  . Insulin Syringe-Needle U-100 31G X 15/64" 0.5 ML MISC Use 3x a day 100 each 11  . levothyroxine (SYNTHROID, LEVOTHROID) 150 MCG tablet Take 150 mcg by mouth  daily before breakfast.     . losartan (COZAAR) 100 MG tablet Take 100 mg by mouth daily.     . TRESIBA FLEXTOUCH 200 UNIT/ML SOPN INJECT 80 UNITS SUBCUTANEOUSLY AT BEDTIME (Patient taking differently: INJECT 70 UNITS SUBCUTANEOUSLY MORNING) 9 pen 1  . Vitamin D, Ergocalciferol, (DRISDOL) 50000 UNITS CAPS capsule Take 50,000 Units by mouth every 7 (seven) days.   1   No current facility-administered medications for this visit.    NKDA   Family History  Problem Relation Age of Onset  . Hypertension Mother   . Thyroid disease Mother   . CVA Mother   . Hypertension Sister   . Thyroid disease Sister   . Hypertension Brother   . Hyperlipidemia Brother    PE: BP (!) 154/84 (BP Location: Left Arm, Patient Position: Sitting)   Pulse 91   Ht '5\' 7"'  (1.702 m)   Wt 270 lb (122.5 kg)   SpO2 94%   BMI 42.29 kg/m  Body mass index is 42.29 kg/m. Wt Readings from Last 3 Encounters:  11/25/16 270 lb (122.5 kg)  08/29/16 261 lb 3.2 oz (118.5 kg)  05/05/16 259 lb (117.5 kg)   Constitutional: obese, in NAD, walks with a cane Eyes: PERRLA, EOMI, no exophthalmos ENT: moist mucous membranes, no neck masses palpable, no cervical lymphadenopathy Cardiovascular: RRR, No MRG Respiratory: CTA B Gastrointestinal: abdomen soft, NT, ND, BS+ Musculoskeletal: no deformities,  strength intact in all 4 Skin: moist, warm, no rashes Neurological: no tremor with outstretched hands, DTR normal in all 4  ASSESSMENT: 1. DM2, insulin-dependent, uncontrolled, with complications - CKD - DR  2. 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 She did not have RAI treatment at that time, however, she was found to have a thyroid mass in 2005. This was biopsied and returned as normal thyroid tissue.She had RAI treatment at that time. The posttreatment whole-body scan was negative for any cancer spread.   - Neck U/S (09/19/2014): 1. Post total thyroidectomy. 2. No change in the previously biopsied approximately 1.4 cm echogenic solid nodule within the inferior aspect of the right lobe of the thyroid, grossly unchanged since the 2005 examination. 3. Apparent development of an approximately 0.4 cm hypoechoic nodule within the right thyroidectomy bed - while too small for definitive characterization, this nodule appears to contain an echogenic hilum and thus is favored to represent a non pathologically enlarged cervical lymph node.  - Neck U/S (11/27/2015):  1. Surgical changes of prior total thyroidectomy. 2. Unchanged 1.4 cm echogenic soft tissue nodule in the right thyroid resection bed. Continued stability over time consistent with a benign process.  3. Post surgical hypothyroidism  PLAN:  1. Patient with long-standing, uncontrolled diabetes, on basal-bolus insulin regimen with Tyler Aas and Novolin R but now off Metformin because of leg swelling. Sugars worsened last winter, with a very high HbA1c obtained by PCP in 08/2016:11.8%. We rechecked her HbA1c today and this is better, but still high, and 9.3%. - Today, she brought her sugar log and her sugars appear to be still high throughout the day. He does appear  that her mealtime insulin is not sufficient, so I advised her today to increase the doses. Also, will add a Novolin sliding scale. - will continue:  Patient Instructions  Please continue: - Tresiba 70 units daily  Increase: -  ReliOn Novolin  to 2-3x a day:  - before breakfast and dinner:  25 units before a smaller meal  30 units before a larger meal - before lunch:  15 units - if you eat lunch  Please add: - ReliOn Novolin sliding scale: - 150- 165: + 1 unit  - 166- 180: + 2 units  - 181- 195: + 3 units  - 196- 210: + 4 units  - 211- 225: + 5 units  - 226- 240: + 6 units  - 241- 255: + 7 units  - >255 + 8 units   Please return in 3 months with your sugar log.   - refused flu vaccine at last visits - advised for yearly eye exams >> she is up-to-date - No Follow-up on file.  2. 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 On her last thyroid ultrasound obtained after last visit >> there is a 1.4 cm mass in the thyroid bed, stable from 2005 - reviewed Tg from 08/2016: detectable, but stable.  3. Postsurgical hypothyroidism - She is taking her levothyroxine correctly  - We reviewed last set of her TFTs from 08/2016 >> normal - Continue LT4 150 mcg daily.  [name], MD PhD [facility] Endocrinology

## 2016-12-05 ENCOUNTER — Other Ambulatory Visit: Payer: Self-pay

## 2016-12-07 ENCOUNTER — Other Ambulatory Visit: Payer: Self-pay

## 2016-12-07 ENCOUNTER — Telehealth: Payer: Self-pay

## 2016-12-07 MED ORDER — "INSULIN SYRINGE-NEEDLE U-100 31G X 15/64"" 0.5 ML MISC": 11 refills | Status: DC

## 2016-12-07 NOTE — Telephone Encounter (Signed)
Called and LVM advising patient we had received her Novolin R from the patient assistance program she could come pick it up at anytime. Placed in fridge up front. Gave call back number if any questions.

## 2016-12-21 ENCOUNTER — Telehealth: Payer: Self-pay | Admitting: Internal Medicine

## 2016-12-21 ENCOUNTER — Telehealth: Payer: Self-pay

## 2016-12-21 NOTE — Telephone Encounter (Signed)
Called and advised with patient that I had no received any Tresiba in the office yet from patient assistance. I advised I would notify patient if we received any.

## 2016-12-21 NOTE — Telephone Encounter (Signed)
Called patient and advised I had no received any Tresiba from the patient assistance yet for this patient, I advised I would call if we got any in for her.

## 2016-12-21 NOTE — Telephone Encounter (Signed)
Patient has question about patient assistance form for her medication TRESIBA, did it go through or do she need to fill out another form.  Please advise.

## 2017-02-24 ENCOUNTER — Encounter: Payer: Self-pay | Admitting: Internal Medicine

## 2017-02-24 ENCOUNTER — Ambulatory Visit (INDEPENDENT_AMBULATORY_CARE_PROVIDER_SITE_OTHER): Payer: Self-pay | Admitting: Internal Medicine

## 2017-02-24 VITALS — BP 142/80 | HR 85 | Ht 67.5 in | Wt 264.0 lb

## 2017-02-24 DIAGNOSIS — E1165 Type 2 diabetes mellitus with hyperglycemia: Secondary | ICD-10-CM

## 2017-02-24 DIAGNOSIS — C73 Malignant neoplasm of thyroid gland: Secondary | ICD-10-CM

## 2017-02-24 DIAGNOSIS — IMO0002 Reserved for concepts with insufficient information to code with codable children: Secondary | ICD-10-CM

## 2017-02-24 DIAGNOSIS — E890 Postprocedural hypothyroidism: Secondary | ICD-10-CM

## 2017-02-24 DIAGNOSIS — E1121 Type 2 diabetes mellitus with diabetic nephropathy: Secondary | ICD-10-CM

## 2017-02-24 LAB — POCT GLYCOSYLATED HEMOGLOBIN (HGB A1C): Hemoglobin A1C: 11.9

## 2017-02-24 MED ORDER — INSULIN REGULAR HUMAN 100 UNIT/ML IJ SOLN: INTRAMUSCULAR | 11 refills | Status: DC

## 2017-02-24 MED ORDER — INSULIN NPH (HUMAN) (ISOPHANE) 100 UNIT/ML ~~LOC~~ SUSP: SUBCUTANEOUS | 11 refills | Status: DC

## 2017-02-24 NOTE — Addendum Note (Signed)
Addended by: Caprice Beaver T on: 02/24/2017 04:17 PM   Modules accepted: Orders

## 2017-02-24 NOTE — Patient Instructions (Addendum)
Please change: -  ReliOn Novolin to 2-3x a day:  - before breakfast and dinner:  25 units before a smaller meal  30 units before a larger meal - before lunch:  15 units - if you eat lunch - ReliOn Novolin sliding scale: - 150- 165: + 1 unit  - 166- 180: + 2 units  - 181- 195: + 3 units  - 196- 210: + 4 units  - 211- 225: + 5 units  - 226- 240: + 6 units  - 241- 255: + 7 units  - >255 + 8 units   Let's start: - NPH ReliOn 30 units in am and 20 units before dinner  Insulin Before breakfast Before lunch Before dinner  Regular 25-30 15 25-30  NPH 30  20  Please inject the insulin 30 min before meals.  Please return in 3 months with your sugar log.

## 2017-02-24 NOTE — Progress Notes (Signed)
Patient ID: Courtney [name], female   DOB: Nov 19, 1955, 61 y.o.   MRN: [id number]  HPI: Courtney [name] is a 61 y.o.-year-old female, initially referred by her PCP, Dr. 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 Last visit 3 mo ago.  She was terminated at her job last year >> no insurance now.   Sister died last month.  DM2: Last hemoglobin A1c was: Lab Results  Component Value Date   HGBA1C 9.5 11/25/2016   HGBA1C 8.2 (H) 04/13/2016   HGBA1C 9.1 02/05/2016  08/22/2016: HbA1c 11.8% 08/01/2014: HbA1c 14.5% 05/02/2014: HbA1c 14.8% 01/31/2014: HbA1c 13.6% 11/01/2013: HbA1c 15.1%  She was on: - Tresiba U200 70 units in am - ReliOn Novolin to 2-3x a day: - before breakfast and dinner:  20 units with a regular meal  25 units before a larger meal  (15 units before lunch if you eat lunch) - not using Stopped Metformin 500 mg 2x a day - b/c leg swelling. She was on Novolin 70/30 48 units 2x a day 15 min before the meals  She was on Amaryl 8 mg in am She was on Metformin >> stopped 2/2 CKD.  Now on: - ReliOn Novolin to 2x a day: 50 units with B and D and occas. 10 with lunch Ran out of Tresiba in 12/2016 >> no long acting insulin now.   She is checking sugars 1-2x a day: - am:  80-189, 238, 264 (sick) >> 125-205, 230, 275 >> 121-232 - 2h after b'fast: n/c - before lunch: n/c >> 72-149 >> n/c >> 81-134 >> 148, 170 >> 189 >> n/c - 2h after lunch: n/c >> 67, 228 >> n/c - before dinner: 135-140 >> 240s >> n/c >> 142-162 >> n/c >> 154-220 >> 108-286 - 2h after dinner: n/c >> 131, 269 >> n/c - bedtime: n/c >> 81-160 >> n/c >> 240s >> n/c - nighttime: n/c No lows. Lowest sugar was 125 >> 108; ? hypoglycemia awareness Highest sugar was 275 >> 286  Glucometer: Prodigy  Pt's meals are: - Breakfast: varies - mostly eats out -  may skip. Oatmeal, sausage or bacon, apples - Lunch: peanuts, sandwich + crystal lite drink - Dinner: frozen dinner - Snacks: fruit, small bag potato chips, peanuts  - + stage 3 CKD, BUN/creatinine:  08/22/2016: BUN/creatinine 20/1.41 03/18/2016: BUN/creatinine 16/1.21, EGFR 57, Calcium 8.7, AST/ALT 17/24 09/18/2015: BUN/creatinine 14/1.2, GFR 57 (previously 55) 08/01/2014: 15/1.26, GFR 55  - lipids: 03/18/2016: Lipids: 156/85/70/69 09/18/2015: 160/57/87/62 08/01/2014: 145/78/74/55  - last eye exam was in 12/2016. + DR, had Laser Sx, still getting intraocular injections; L eye cataract >> had cataract sx 02/25/2015.  - denies numbness and tingling in her feet.  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  - She did not have RAI treatment at that time, however, she was found to have a thyroid mass in 2005 >> This was biopsied and returned as normal thyroid tissue, but had RAI treatment at that time.  - The posttreatment whole-body scan was negative for any cancer spread.   Thyroglobulin returned at 2.3 in 08/2014 >> I ordered a neck ultrasound that showed a stable nodule with a fatty hilum, consistent with a benign lymph node, but no other  masses in the surgical bed.   The thyroglobulin level remained detectable, and was actually 9.9 in 10/2015.   A neck ultrasound (11/2015) showed a stable 1.4 nodule in the thyroid bed, consistent with a benign process.  Reviewed latest labs: Component     Latest Ref Rng & Units 12/26/2014 09/18/2015 11/06/2015 08/29/2016  Thyroglobulin by IMA     1.5 - 38.5 ng/mL 0.8 (L)     Thyroglobulin Ab     <2 IU/mL  <1  <1  Thyroglobulin     ng/mL  2.4 (L)  2.5 (L)  THYROGLOBULIN (TG-RIA)     ng/mL   9.9     Postsurgical hypothyroidism: Last TSH:  Lab Results  Component Value Date    TSH 1.82 08/29/2016   TSH 1.03 09/18/2015   TSH 2.72 12/26/2014   FREET4 1.30 08/29/2016   FREET4 1.13 09/18/2015   FREET4 1.00 12/26/2014  08/01/2014: TSH 1.89  Pt is on levothyroxine 150 mcg daily, taken: - in am - fasting - at least 30 min from b'fast - no Ca, Fe, MVI, PPIs - not on Biotin  ROS: Constitutional: no weight gain/no weight loss, no fatigue, no subjective hyperthermia, no subjective hypothermia Eyes: no blurry vision, no xerophthalmia ENT: no sore throat, no nodules palpated in throat, no dysphagia, no odynophagia, no hoarseness Cardiovascular: no CP/no SOB/no palpitations/no leg swelling Respiratory: no cough/no SOB/no wheezing Gastrointestinal: no N/no V/no D/no C/no acid reflux Musculoskeletal: no muscle aches/no joint aches Skin: no rashes, no hair loss Neurological: no tremors/no numbness/no tingling/no dizziness  I reviewed pt's medications, allergies, PMH, social hx, family hx, and changes were documented in the history of present illness. Otherwise, unchanged from my initial visit note.   Past Medical History:  Diagnosis Date  . Anemia   . Arthritis   . Chronic kidney disease   . Diabetes mellitus without complication ([place])   . Hypertension   . MRSA infection 2013   on leg; surgery   . Neuropathy due to secondary diabetes ([place])   . Thyroid disease    Past Surgical History:  Procedure Laterality Date  . BILATERAL CARPAL TUNNEL RELEASE Bilateral 2003  . CATARACT EXTRACTION W/ INTRAOCULAR LENS IMPLANT Left 2016  . CHOLECYSTECTOMY    . 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 EYE SURGERY    . KNEE ARTHROSCOPY Left 05/05/2016   Procedure: ARTHROSCOPY KNEE;  Surgeon: Thornton Park, MD;  Location: [facility] ORS;  Service: Orthopedics;  Laterality: Left;  . SHOULDER ARTHROSCOPY Left 2004  . THYROID SURGERY  2004  . TRIGGER FINGER RELEASE  2003   History   Social History Main Topics  . Smoking status: Former Research scientist (life sciences)  . Smokeless tobacco: Not on file  .  Alcohol Use: No  . Drug Use: No   Social History Narrative   Single   0 children   Copy (travels regularly)      Beer and wine on occasion   First menstrual cycle: 6th grade   Postmenopausal      Current Outpatient Prescriptions  Medication Sig Dispense Refill  . amLODipine (NORVASC) 10 MG tablet Take 10 mg by mouth daily. Reported on 11/06/2015    . Insulin Pen Needle (CAREFINE PEN NEEDLES) 32G X 4 MM MISC Use 1x a day 100 each 3  . insulin regular (NOVOLIN R RELION) 250 units/2.61m (100 units/mL) injection INJECT 20-25 UNITS SUB-Q THREE TIMES DAILY BEFORE MEALS. ReliOn insulin. 10 mL 11  . Insulin Syringe-Needle U-100 31G X 15/64"  0.5 ML MISC Use 3x a day 100 each 11  . levothyroxine (SYNTHROID, LEVOTHROID) 150 MCG tablet Take 150 mcg by mouth daily before breakfast.     . losartan (COZAAR) 100 MG tablet Take 100 mg by mouth daily.     . Vitamin D, Ergocalciferol, (DRISDOL) 50000 UNITS CAPS capsule Take 50,000 Units by mouth every 7 (seven) days.   1  . TRESIBA FLEXTOUCH 200 UNIT/ML SOPN INJECT 80 UNITS SUBCUTANEOUSLY AT BEDTIME (Patient not taking: Reported on 02/24/2017) 9 pen 1   No current facility-administered medications for this visit.    NKDA   Family History  Problem Relation Age of Onset  . Hypertension Mother   . Thyroid disease Mother   . CVA Mother   . Hypertension Sister   . Thyroid disease Sister   . Hypertension Brother   . Hyperlipidemia Brother    PE: BP (!) 142/80 (BP Location: Left Arm, Patient Position: Sitting)   Pulse 85   Ht 5' 7.5" (1.715 m)   Wt 264 lb (119.7 kg)   SpO2 94%   BMI 40.74 kg/m  Body mass index is 40.74 kg/m. Wt Readings from Last 3 Encounters:  02/24/17 264 lb (119.7 kg)  11/25/16 270 lb (122.5 kg)  08/29/16 261 lb 3.2 oz (118.5 kg)   Constitutional: overweight, in NAD, walks with a cane Eyes: PERRLA, EOMI, no exophthalmos ENT: moist mucous membranes, no thyromegaly, no cervical lymphadenopathy Cardiovascular:  RRR, No MRG Respiratory: CTA B Gastrointestinal: abdomen soft, NT, ND, BS+ Musculoskeletal: no deformities, strength intact in all 4 Skin: moist, warm, no rashes Neurological: no tremor with outstretched hands, DTR normal in all 4   ASSESSMENT: 1. DM2, insulin-dependent, uncontrolled, with complications - CKD - DR  2. 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 She did not have RAI treatment at that time, however, she was found to have a thyroid mass in 2005. This was biopsied and returned as normal thyroid tissue.She had RAI treatment at that time. The posttreatment whole-body scan was negative for any cancer spread.   - Neck U/S (09/19/2014): 1. Post total thyroidectomy. 2. No change in the previously biopsied approximately 1.4 cm echogenic solid nodule within the inferior aspect of the right lobe of the thyroid, grossly unchanged since the 2005 examination. 3. Apparent development of an approximately 0.4 cm hypoechoic nodule within the right thyroidectomy bed - while too small for definitive characterization, this nodule appears to contain an echogenic hilum and thus is favored to represent a non pathologically enlarged cervical lymph node.  - Neck U/S (11/27/2015):  1. Surgical changes of prior total thyroidectomy. 2. Unchanged 1.4 cm echogenic soft tissue nodule in the right thyroid resection bed. Continued stability over time consistent with a benign process.  3. Post surgical hypothyroidism  PLAN:  1. Patient with long-standing, uncontrolled diabetes, prev. On basal-bolus insulin, now off basal insulin as she cannot afford [place] >> will add NPH and I advised her how to mix this with R insulin. - I advised her to: Patient Instructions   Please change: -  ReliOn Novolin to 2-3x a day:  - before breakfast  and dinner:  25 units before a smaller meal  30 units before a larger meal - before lunch:  15 units - if you eat lunch - ReliOn Novolin sliding scale: - 150- 165: + 1 unit  -  166- 180: + 2 units  - 181- 195: + 3 units  - 196- 210: + 4 units  - 211- 225: + 5 units  - 226- 240: + 6 units  - 241- 255: + 7 units  - >255 + 8 units   Let's start: - NPH ReliOn 30 units in am and 20 units before dinner  Insulin Before breakfast Before lunch Before dinner  Regular 25-30 15 25-30  NPH 30  20  Please inject the insulin 30 min before meals.  Please return in 3 months with your sugar log.   - today, HbA1c is 11.9% (much worse) - continue checking sugars at different times of the day - check 3x a day, rotating checks - advised for yearly eye exams >> she is UTD - Return to clinic in 3 mo with sugar log   2. 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 On her last thyroid ultrasound obtained after last visit >> there is a 1.4 cm mass in the thyroid bed, stable from 2005 - reviewed Tg from 08/2016: detectable, but stable. >> recheck in 08/2016  3. Postsurgical hypothyroidism - latest thyroid labs reviewed with pt >> normal  - she continues on LT4 150 mcg daily - pt feels good on this dose. - we discussed about taking the thyroid hormone every day, with water, >30 minutes before breakfast, separated by >4 hours from acid reflux medications, calcium, iron, multivitamins. Pt. is taking it correctly - will check thyroid tests at next visit - RTC in 3 mo  [name], MD PhD [facility] Endocrinology

## 2017-04-05 ENCOUNTER — Telehealth: Payer: Self-pay | Admitting: Internal Medicine

## 2017-04-05 ENCOUNTER — Telehealth: Payer: Self-pay

## 2017-04-05 NOTE — Telephone Encounter (Signed)
Patient called in reference to feet and legs starting to swell from RX insulin regular (NOVOLIN R RELION) 100 units/mL injection. Patient wants to know if this is normal for this medication.  Please call patient and advise. OK to leave message no answer.

## 2017-04-05 NOTE — Telephone Encounter (Signed)
That would be very unusual. He got this before in the form of 70/30. Please check with PCP to see if there are not other pbs going on.

## 2017-04-05 NOTE — Telephone Encounter (Signed)
Called patient and advised of MD note. Patient will check in with PCP.

## 2017-05-31 ENCOUNTER — Ambulatory Visit (INDEPENDENT_AMBULATORY_CARE_PROVIDER_SITE_OTHER): Payer: Self-pay | Admitting: Internal Medicine

## 2017-05-31 ENCOUNTER — Encounter: Payer: Self-pay | Admitting: Internal Medicine

## 2017-05-31 VITALS — BP 130/74 | HR 68 | Ht 67.0 in | Wt 268.0 lb

## 2017-05-31 DIAGNOSIS — E1121 Type 2 diabetes mellitus with diabetic nephropathy: Secondary | ICD-10-CM

## 2017-05-31 DIAGNOSIS — C73 Malignant neoplasm of thyroid gland: Secondary | ICD-10-CM

## 2017-05-31 DIAGNOSIS — IMO0002 Reserved for concepts with insufficient information to code with codable children: Secondary | ICD-10-CM

## 2017-05-31 DIAGNOSIS — E890 Postprocedural hypothyroidism: Secondary | ICD-10-CM

## 2017-05-31 DIAGNOSIS — E1165 Type 2 diabetes mellitus with hyperglycemia: Secondary | ICD-10-CM

## 2017-05-31 LAB — POCT GLYCOSYLATED HEMOGLOBIN (HGB A1C): Hemoglobin A1C: 11.3

## 2017-05-31 MED ORDER — INSULIN REGULAR HUMAN 100 UNIT/ML IJ SOLN: INTRAMUSCULAR | 11 refills | Status: DC

## 2017-05-31 MED ORDER — INSULIN NPH (HUMAN) (ISOPHANE) 100 UNIT/ML ~~LOC~~ SUSP: SUBCUTANEOUS | 11 refills | Status: DC

## 2017-05-31 NOTE — Patient Instructions (Addendum)
Please try to get the ReliOn strips and start checking sugars.  Please increase: Insulin Before breakfast Before lunch Before dinner  Regular 40  35  NPH 40  35   Please return in 1.5 months with your sugar log.

## 2017-05-31 NOTE — Progress Notes (Signed)
Patient ID: Courtney [name], female   DOB: [birth year], 61 y.o.   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 Last visit 3 mo ago.  She does not have any income at the moment after being laid off from work.  DM2: Last hemoglobin A1c was: Lab Results  Component Value Date   HGBA1C 11.9 02/24/2017   HGBA1C 9.5 11/25/2016   HGBA1C 8.2 (H) 04/13/2016  08/22/2016: HbA1c 11.8% 08/01/2014: HbA1c 14.5% 05/02/2014: HbA1c 14.8% 01/31/2014: HbA1c 13.6% 11/01/2013: HbA1c 15.1%  She was on: - Tresiba U200 70 units in am - ReliOn Novolin to 2-3x a day: - before breakfast and dinner:  20 units with a regular meal  25 units before a larger meal  (15 units before lunch if you eat lunch) - not using Stopped Metformin 500 mg 2x a day - b/c leg swelling. She was on Novolin 70/30 48 units 2x a day 15 min before the meals  She was on Amaryl 8 mg in am She was on Metformin >> stopped 2/2 CKD.  Then on: - ReliOn Novolin to 2x a day: 50 units with B and D and occas. 10 with lunch Ran out of Tresiba in 12/2016 >> no long acting insulin now.   She is now on:  Insulin Before breakfast Before lunch Before dinner  Regular 25-30  not eating 25-30  NPH >> 20  20   She was checking sugars 1-2 times a day - not checking as she does not have glucose strips (cannot afford them). From last visit: - am:   125-205, 230, 275 >> 121-232 - 2h after b'fast: n/c - before lunch:  81-134 >> 148, 170 >> 189 >> n/c - 2h after lunch: n/c >> 67, 228 >> n/c - before dinner:  142-162 >> n/c >> 154-220 >> 108-286 - 2h after dinner: n/c >> 131, 269 >> n/c - bedtime: n/c >> 81-160 >> n/c >> 240s >> n/c - nighttime: n/c  Lowest sugar was 125 >> 108 >> ?; ? hypoglycemia awareness Highest sugar was 275  >> 286 >> ?  Glucometer: Prodigy >> but no strips  Pt's meals are: - Breakfast: varies - mostly eats out - may skip. Oatmeal, sausage or bacon, apples - Lunch: peanuts, sandwich + crystal lite drink - Dinner: frozen dinner - Snacks: fruit, small bag potato chips, peanuts  - she has stage 3 CKD, BUN/creatinine:  08/22/2016: BUN/creatinine 20/1.41 03/18/2016: BUN/creatinine 16/1.21, EGFR 57, Calcium 8.7, AST/ALT 17/24 09/18/2015: BUN/creatinine 14/1.2, GFR 57 (previously 55) 08/01/2014: 15/1.26, GFR 55 On Cozaar. - lipids: 03/18/2016: Lipids: 156/85/70/69 09/18/2015: 160/57/87/62 08/01/2014: 145/78/74/55  - last eye exam was in 12/2016 >> + DR, had Laser Sx, getting intraocular injections; L eye cataract >> had cataract sx 02/25/2015.  - no numbness and tingling in her feet.  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  - She did not have RAI treatment at that time, however, she was found to have a thyroid mass in 2005 >> This was biopsied and returned as normal thyroid tissue, but had RAI treatment at that time.  - The posttreatment whole-body scan was negative for any cancer spread.   Thyroglobulin  returned at 2.3 in 08/2014 >> I ordered a neck ultrasound that showed a stable nodule with a fatty hilum, consistent with a benign lymph node, but no other masses in the surgical bed.   The thyroglobulin level remained detectable, and was actually 9.9 in 10/2015.   A neck ultrasound (11/2015) showed a stable 1.4 nodule in the thyroid bed, consistent with a benign process.  Reviewed latest labs: Component     Latest Ref Rng & Units 12/26/2014 09/18/2015 11/06/2015 08/29/2016  Thyroglobulin by IMA     1.5 - 38.5 ng/mL 0.8 (L)     Thyroglobulin Ab     <2 IU/mL  <1  <1  Thyroglobulin     ng/mL  2.4 (L)  2.5 (L)   THYROGLOBULIN (TG-RIA)     ng/mL   9.9     Postsurgical hypothyroidism: Last TSH - normal: Lab Results  Component Value Date   TSH 1.82 08/29/2016   TSH 1.03 09/18/2015   TSH 2.72 12/26/2014   FREET4 1.30 08/29/2016   FREET4 1.13 09/18/2015   FREET4 1.00 12/26/2014  08/01/2014: TSH 1.89  Pt is on levothyroxine 150 mcg daily, taken:  - in am - fasting - at least 30 min from b'fast - no Ca, Fe, MVI, PPIs - not on Biotin  ROS: Constitutional: no weight gain/no weight loss, no fatigue, no subjective hyperthermia, no subjective hypothermia Eyes: no blurry vision, no xerophthalmia ENT: no sore throat, no nodules palpated in throat, no dysphagia, no odynophagia, no hoarseness Cardiovascular: no CP/no SOB/no palpitations/+ leg swelling Respiratory: no cough/no SOB/no wheezing Gastrointestinal: no N/no V/no D/no C/no acid reflux Musculoskeletal: no muscle aches/no joint aches Skin: no rashes, no hair loss Neurological: no tremors/no numbness/no tingling/no dizziness  I reviewed pt's medications, allergies, PMH, social hx, family hx, and changes were documented in the history of present illness. Otherwise, unchanged from my initial visit note. She was started on a fluid pill by PCP (we do not have the records).  Past Medical History:  Diagnosis Date  . Anemia   . Arthritis   . Chronic kidney disease   . Diabetes mellitus without complication ([place])   . Hypertension   . MRSA infection 2013   on leg; surgery   . Neuropathy due to secondary diabetes ([place])   . Thyroid disease    Past Surgical History:  Procedure Laterality Date  . BILATERAL CARPAL TUNNEL RELEASE Bilateral 2003  . CATARACT EXTRACTION W/ INTRAOCULAR LENS IMPLANT Left 2016  . CHOLECYSTECTOMY    . 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 EYE SURGERY    . KNEE ARTHROSCOPY Left 05/05/2016   Procedure: ARTHROSCOPY KNEE;  Surgeon: Thornton Park, MD;  Location: [facility] ORS;  Service: Orthopedics;  Laterality: Left;  .  SHOULDER ARTHROSCOPY Left 2004  . THYROID SURGERY  2004  . TRIGGER FINGER RELEASE  2003   History   Social History Main Topics  . Smoking status: Former Research scientist (life sciences)  . Smokeless tobacco: Not on file  . Alcohol Use: No  . Drug Use: No   Social History Narrative   Single   0 children   [place] (travels regularly)      Beer and wine on occasion   First menstrual cycle: 6th grade   Postmenopausal      Current Outpatient Prescriptions  Medication Sig Dispense Refill  . amLODipine (NORVASC) 10 MG tablet Take 10 mg by mouth daily. Reported on 11/06/2015    . insulin NPH Human (HUMULIN N,NOVOLIN N) 100  UNIT/ML injection Inject 30 units in am and 20 units before dinner 20 mL 11  . insulin regular (NOVOLIN R RELION) 100 units/mL injection INJECT 25-40 UNITS SUB-Q THREE TIMES DAILY BEFORE MEALS. ReliOn insulin. 20 mL 11  . Insulin Syringe-Needle U-100 31G X 15/64" 0.5 ML MISC Use 3x a day 100 each 11  . levothyroxine (SYNTHROID, LEVOTHROID) 150 MCG tablet Take 150 mcg by mouth daily before breakfast.     . losartan (COZAAR) 100 MG tablet Take 100 mg by mouth daily.     . Vitamin D, Ergocalciferol, (DRISDOL) 50000 UNITS CAPS capsule Take 50,000 Units by mouth every 7 (seven) days.   1   No current facility-administered medications for this visit.    NKDA   Family History  Problem Relation Age of Onset  . Hypertension Mother   . Thyroid disease Mother   . CVA Mother   . Hypertension Sister   . Thyroid disease Sister   . Hypertension Brother   . Hyperlipidemia Brother    PE: BP 130/74 (BP Location: Left Arm, Patient Position: Sitting)   Pulse 68   Ht '5\' 7"'  (1.702 m)   Wt 268 lb (121.6 kg)   BMI 41.97 kg/m  Body mass index is 41.97 kg/m. Wt Readings from Last 3 Encounters:  05/31/17 268 lb (121.6 kg)  02/24/17 264 lb (119.7 kg)  11/25/16 270 lb (122.5 kg)   Constitutional: overweight, in NAD, walks with cane Eyes: PERRLA, EOMI, no exophthalmos ENT: moist mucous  membranes, no thyromegaly, no cervical lymphadenopathy Cardiovascular: RRR, No MRG, + LE edema, R>L Respiratory: CTA B Gastrointestinal: abdomen soft, NT, ND, BS+ Musculoskeletal: no deformities, strength intact in all 4 Skin: moist, warm, no rashes Neurological: no tremor with outstretched hands, DTR normal in all 4  ASSESSMENT: 1. DM2, insulin-dependent, uncontrolled, with complications - CKD - DR  2. 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 She did not have RAI treatment at that time, however, she was found to have a thyroid mass in 2005. This was biopsied and returned as normal thyroid tissue.She had RAI treatment at that time. The posttreatment whole-body scan was negative for any cancer spread.   - Neck U/S (09/19/2014): 1. Post total thyroidectomy. 2. No change in the previously biopsied approximately 1.4 cm echogenic solid nodule within the inferior aspect of the right lobe of the thyroid, grossly unchanged since the 2005 examination. 3. Apparent development of an approximately 0.4 cm hypoechoic nodule within the right thyroidectomy bed - while too small for definitive characterization, this nodule appears to contain an echogenic hilum and thus is favored to represent a non pathologically enlarged cervical lymph node.  - Neck U/S (11/27/2015):  1. Surgical changes of prior total thyroidectomy. 2. Unchanged 1.4 cm echogenic soft tissue nodule in the right thyroid resection bed. Continued stability over time consistent with a benign process.  3. Post surgical hypothyroidism  PLAN:  1. Patient with long-standing, uncontrolled type 2 diabetes, previously on analog insulins, but currently on NPH and regular insulin due to cost. Unfortunately, she could not afford test strips since last visit, so we do not have  any sugar checks. However, today, HbA1c is 11.3% (still extremely high), so her diabetes control is still poor. She does not acknowledge missed insulin doses. - I will advise her to increase her NPH and regular insulin and  I also advised her to obtain a ReliOn glucometer and strips and start checking her blood sugars - I advised her to: Patient Instructions   Please try to get the ReliOn strips and start checking sugars.  Please increase: Insulin Before breakfast Before lunch Before dinner  Regular 40  35  NPH 40  35   Please return in 3 months with your sugar log.   - continue checking sugars at different times of the day - check 3x a day, rotating checks - advised for yearly eye exams >> she is UTD - Return to clinic in 3 mo with sugar log    2. Thyroid cancer, in remission - Patient has subcentimeter follicular variant of papillary thyroid cancer, multifocal, likely completely cured after her thyroidectomy, especially since she also had RAI treatment a year later (in 2005). Latest thyroid ultrasound was in 11/2015 and it shows a stable mass (since 2005) in the thyroid fossa consistent with a benign process - reviewed Tg from 08/2016: Detectable, but stable >> check this at next visit, in 08/2017 - She has no neck compression symptoms  3. Postsurgical hypothyroidism - latest thyroid labs reviewed with pt >> normal  - she continues on LT4 150 mcg daily - pt feels good on this dose. - we discussed about taking the thyroid hormone every day, with water, >30 minutes before breakfast, separated by >4 hours from acid reflux medications, calcium, iron, multivitamins. Pt. is taking it correctly  [name], MD PhD [facility] Endocrinology

## 2017-06-02 ENCOUNTER — Encounter: Payer: Self-pay | Admitting: Internal Medicine

## 2017-08-15 ENCOUNTER — Encounter: Payer: Self-pay | Admitting: Internal Medicine

## 2017-08-15 ENCOUNTER — Ambulatory Visit (INDEPENDENT_AMBULATORY_CARE_PROVIDER_SITE_OTHER): Payer: Self-pay | Admitting: Internal Medicine

## 2017-08-15 VITALS — BP 136/70 | HR 80 | Wt 277.4 lb

## 2017-08-15 DIAGNOSIS — E890 Postprocedural hypothyroidism: Secondary | ICD-10-CM

## 2017-08-15 DIAGNOSIS — IMO0002 Reserved for concepts with insufficient information to code with codable children: Secondary | ICD-10-CM

## 2017-08-15 DIAGNOSIS — E1165 Type 2 diabetes mellitus with hyperglycemia: Secondary | ICD-10-CM

## 2017-08-15 DIAGNOSIS — E1121 Type 2 diabetes mellitus with diabetic nephropathy: Secondary | ICD-10-CM

## 2017-08-15 DIAGNOSIS — C73 Malignant neoplasm of thyroid gland: Secondary | ICD-10-CM

## 2017-08-15 MED ORDER — INSULIN REGULAR HUMAN 100 UNIT/ML IJ SOLN: INTRAMUSCULAR | 11 refills | Status: DC

## 2017-08-15 MED ORDER — INSULIN NPH (HUMAN) (ISOPHANE) 100 UNIT/ML ~~LOC~~ SUSP: SUBCUTANEOUS | 11 refills | Status: DC

## 2017-08-15 NOTE — Patient Instructions (Addendum)
Please increase: Insulin Before breakfast Before lunch Before dinner  Regular 35 10 35  NPH 40  30   Please return in 3 months with your sugar log.

## 2017-08-15 NOTE — Progress Notes (Signed)
Patient ID: Courtney [name], female   DOB: Apr 24, 1956, 61 y.o.   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 Last visit 3 mo ago.  She started to work 4 hours a week 5x a week.  Since last visit, she had L leg swelling. She started HCTZ and Lyrica. U/S of the leg: no blood clot.  The leg still bothers her.  PCP suggested that she should see orthopedic doctors.  She is waiting for her insurance to approve this consult.  DM2: Last hemoglobin A1c was: Lab Results  Component Value Date   HGBA1C 11.3 05/31/2017   HGBA1C 11.9 02/24/2017   HGBA1C 9.5 11/25/2016  08/22/2016: HbA1c 11.8% 08/01/2014: HbA1c 14.5% 05/02/2014: HbA1c 14.8% 01/31/2014: HbA1c 13.6% 11/01/2013: HbA1c 15.1%  She was on: - Tresiba U200 70 units in am - ReliOn Novolin to 2-3x a day: - before breakfast and dinner:  20 units with a regular meal  25 units before a larger meal  (15 units before lunch if you eat lunch) - not using Stopped Metformin 500 mg 2x a day - b/c leg swelling. She was on Novolin 70/30 48 units 2x a day 15 min before the meals  She was on Amaryl 8 mg in am She was on Metformin >> stopped 2/2 CKD.  Then on: - ReliOn Novolin to 2x a day: 50 units with B and D and occas. 10 with lunch Ran out of Tresiba in 12/2016 >> no long acting insulin now.   She is currently on:  Insulin Before breakfast Before lunch Before dinner  Regular  35  35  NPH 35  35   She checks sugars 1-2 times a day: - am:   125-205, 230, 275 >> 121-232 >> 63, 88-92 - 2h after b'fast: n/c - before lunch:  81-134 >> 148, 170 >> 189 >> n/c - 2h after lunch: n/c >> 67, 228 >> n/c - before dinner:  142-162 >> n/c >> 154-220 >> 108-286 >> 130-180 - 2h after dinner: n/c >> 131, 269 >> n/c - bedtime: n/c >>  81-160 >> n/c >> 240s >> n/c  - nighttime: n/c  Lowest sugar was 125 >> 108 >> ? >> 63; ? hypoglycemia awareness Highest sugar was 275 >> 286 >> ? >> 242 yesterday (no insulin)  Glucometer: Prodigy >> ReliOn  Pt's meals are: - Breakfast: chicken  - Lunch: peanuts, sandwich + crystal lite drink - Dinner: frozen dinner - Snacks: fruit, small bag potato chips, peanuts  -+ Stage III CKD, BUN/creatinine:  08/22/2016: BUN/creatinine 20/1.41 03/18/2016: BUN/creatinine 16/1.21, EGFR 57, Calcium 8.7, AST/ALT 17/24 09/18/2015: BUN/creatinine 14/1.2, GFR 57 (previously 55) 08/01/2014: 15/1.26, GFR 55 On Cozaar. - Lipids: 03/18/2016: Lipids: 156/85/70/69 09/18/2015: 160/57/87/62 08/01/2014: 145/78/74/55 Started on Lovastatin 20.  - last eye exam was in 12/2016 >> + DR, had Laser Sx, getting intraocular injections; L eye cataract >> had cataract sx 02/25/2015.  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  - She did not have RAI treatment at that time, however, she was found to have a thyroid mass in 2005 >> This  was biopsied and returned as normal thyroid tissue, but had RAI treatment at that time.  - The posttreatment whole-body scan was negative for any cancer spread.   Thyroglobulin returned at 2.3 in 08/2014 >> I ordered a neck ultrasound that showed a stable nodule with a fatty hilum, consistent with a benign lymph node, but no other masses in the surgical bed.   The thyroglobulin level remained detectable, and was actually 9.9 in 10/2015.   A neck ultrasound (11/2015) showed a stable 1.4 nodule in the thyroid bed, consistent with a benign process.  Reviewed latest labs: Component     Latest Ref Rng & Units 12/26/2014 09/18/2015 11/06/2015  08/29/2016  Thyroglobulin by IMA     1.5 - 38.5 ng/mL 0.8 (L)     Thyroglobulin Ab     <2 IU/mL  <1  <1  Thyroglobulin     ng/mL  2.4 (L)  2.5 (L)  THYROGLOBULIN (TG-RIA)     ng/mL   9.9     Postsurgical hypothyroidism: Last TSH - normal: Lab Results  Component Value Date   TSH 1.82 08/29/2016   TSH 1.03 09/18/2015   TSH 2.72 12/26/2014   FREET4 1.30 08/29/2016   FREET4 1.13 09/18/2015   FREET4 1.00 12/26/2014  08/01/2014: TSH 1.89  Pt is on levothyroxine 150 mcg daily, taken: - in am - fasting - at least 30 min from b'fast - no Ca, Fe, MVI, PPIs - not on Biotin  ROS: Constitutional: no weight gain/no weight loss, no fatigue, no subjective hyperthermia, no subjective hypothermia Eyes: no blurry vision, no xerophthalmia ENT: no sore throat, no nodules palpated in throat, no dysphagia, no odynophagia, no hoarseness Cardiovascular: no CP/no SOB/no palpitations/+ R leg swelling Respiratory: no cough/no SOB/no wheezing Gastrointestinal: no N/no V/no D/no C/no acid reflux Musculoskeletal: no muscle aches/no joint aches Skin: no rashes, no hair loss Neurological: no tremors/no numbness/no tingling/no dizziness  I reviewed pt's medications, allergies, PMH, social hx, family hx, and changes were documented in the history of present illness. Otherwise, unchanged from my initial visit note. She was started on a fluid pill by PCP (we do not have the records).  Past Medical History:  Diagnosis Date  . Anemia   . Arthritis   . Chronic kidney disease   . Diabetes mellitus without complication ([place])   . Hypertension   . MRSA infection 2013   on leg; surgery   . Neuropathy due to secondary diabetes ([place])   . Thyroid disease    Past Surgical History:  Procedure Laterality Date  . BILATERAL CARPAL TUNNEL RELEASE Bilateral 2003  . CATARACT EXTRACTION W/ INTRAOCULAR LENS IMPLANT Left 2016  . CHOLECYSTECTOMY    . 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 EYE SURGERY    . KNEE  ARTHROSCOPY Left 05/05/2016   Procedure: ARTHROSCOPY KNEE;  Surgeon: Thornton Park, MD;  Location: [facility] ORS;  Service: Orthopedics;  Laterality: Left;  . SHOULDER ARTHROSCOPY Left 2004  . THYROID SURGERY  2004  . TRIGGER FINGER RELEASE  2003   History   Social History Main Topics  . Smoking status: Former Research scientist (life sciences)  . Smokeless tobacco: Not on file  . Alcohol Use: No  . Drug Use: No   Social History Narrative   Single   0 children   [place] (travels regularly)      Beer and wine on occasion   First menstrual cycle: 6th grade   Postmenopausal      Current Outpatient Medications  Medication  Sig Dispense Refill  . amLODipine (NORVASC) 10 MG tablet Take 10 mg by mouth daily. Reported on 11/06/2015    . hydrochlorothiazide (HYDRODIURIL) 25 MG tablet Take 25 mg by mouth daily.    . insulin NPH Human (HUMULIN N,NOVOLIN N) 100 UNIT/ML injection Inject 40 units in am and 35 units before dinner 20 mL 11  . insulin regular (NOVOLIN R RELION) 100 units/mL injection INJECT 35-40 UNITS SUB-Q 2 TIMES DAILY BEFORE MEALS. ReliOn insulin. 20 mL 11  . Insulin Syringe-Needle U-100 31G X 15/64" 0.5 ML MISC Use 3x a day 100 each 11  . levothyroxine (SYNTHROID, LEVOTHROID) 150 MCG tablet Take 150 mcg by mouth daily before breakfast.     . losartan (COZAAR) 100 MG tablet Take 100 mg by mouth daily.     Marland Kitchen lovastatin (MEVACOR) 20 MG tablet Take 20 mg by mouth at bedtime.    . pregabalin (LYRICA) 50 MG capsule Take 50 mg by mouth 3 (three) times daily.     No current facility-administered medications for this visit.    NKDA   Family History  Problem Relation Age of Onset  . Hypertension Mother   . Thyroid disease Mother   . CVA Mother   . Hypertension Sister   . Thyroid disease Sister   . Hypertension Brother   . Hyperlipidemia Brother    PE: BP 136/70   Pulse 80   Wt 277 lb 6.4 oz (125.8 kg)   SpO2 96%   BMI 43.45 kg/m  Body mass index is 43.45 kg/m. Wt Readings from Last 3  Encounters:  08/15/17 277 lb 6.4 oz (125.8 kg)  05/31/17 268 lb (121.6 kg)  02/24/17 264 lb (119.7 kg)   Constitutional: overweight, in NAD Eyes: PERRLA, EOMI, no exophthalmos ENT: moist mucous membranes, no thyromegaly, no cervical lymphadenopathy Cardiovascular: RRR, No MRG, + R>L leg swelling Respiratory: CTA B Gastrointestinal: abdomen soft, NT, ND, BS+ Musculoskeletal: no deformities, strength intact in all 4 Skin: moist, warm, + peau d'orange left calf Neurological: no tremor with outstretched hands, DTR normal in all 4 + Foot exam performed today Diabetic Foot Exam - Simple   Simple Foot Form Diabetic Foot exam was performed with the following findings:  Yes 08/15/2017  4:40 PM  Visual Inspection No deformities, no ulcerations, no other skin breakdown bilaterally:  Yes Sensation Testing Intact to touch and monofilament testing bilaterally:  Yes Pulse Check See comments:  Yes Comments I could not palpate the pulses in her right foot due to swelling     ASSESSMENT: 1. DM2, insulin-dependent, uncontrolled, with complications - CKD - DR  2. 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 She did not have RAI treatment at that time, however, she was found to have a thyroid mass in 2005. This was biopsied and returned as normal thyroid tissue.She had RAI treatment at that time. The posttreatment whole-body scan was negative for any cancer spread.   - Neck U/S (09/19/2014): 1. Post total thyroidectomy. 2. No change in the previously biopsied approximately 1.4 cm echogenic solid nodule within the inferior aspect of the right lobe of the thyroid, grossly unchanged since the 2005 examination. 3. Apparent development of an approximately 0.4 cm hypoechoic nodule within the right thyroidectomy bed - while too small for  definitive characterization, this nodule appears to contain an echogenic hilum and thus is  favored to represent a non pathologically enlarged cervical lymph node.  - Neck U/S (11/27/2015):  1. Surgical changes of prior total thyroidectomy. 2. Unchanged 1.4 cm echogenic soft tissue nodule in the right thyroid resection bed. Continued stability over time consistent with a benign process.  3. Post surgical hypothyroidism  PLAN:  1. Patient with Long-standing, uncontrolled type 2 diabetes, previously on an analog insulin regimen, but currently on NPH and regular insulin due to cost.   - At last visit, she was not checking sugars as she could not afford the strips.  I advised her to get the rely on meter from [facility] and she started to check her blood sugars now.  She has done very well since last visit, with lower blood sugars in the morning and higher before dinner.  She mentions that her sugars are higher if she does have lunch, but she usually does not.  We discussed about adding 10 units of regular insulin if she does have lunch.  Since her sugars are so low in the morning, I would reduce her NPH at night but will increase it in the morning to avoid daytime hyperglycemia. - Reviewed her latest HbA1c, which was 11.3% at last visit- today, HbA1c is 9.1% (much better!) - I advised her to: Patient Instructions   Please increase: Insulin Before breakfast Before lunch Before dinner  Regular 35 10 35  NPH 40  30   Please return in 3 months with your sugar log.   - continue checking sugars at different times of the day - check 3 a day, rotating checks - advised for yearly eye exams >> she is UTD - Return to clinic in 3 mo with sugar log     2. Thyroid cancer, in remission - Patient with subcentimeter follicular variant of papillary thyroid cancer, multifocal, likely completely cured after her thyroidectomy, especially since she also had RAI treatment a year later (2005).  Her latest thyroid  ultrasound was in 11/2015 and it showed a stable mass (since 2015) in the thyroid foci, consistent with a benign process - reviewed Tg from 08/2016: Detectable, but stable >> we will check another level today - She has no neck compression symptoms  3. Postsurgical hypothyroidism - latest thyroid labs reviewed with pt >> normal  - she continues on LT4 150 mcg daily - pt feels good on this dose. - we discussed about taking the thyroid hormone every day, with water, >30 minutes before breakfast, separated by >4 hours from acid reflux medications, calcium, iron, multivitamins. Pt. is taking it correctly - We will check TFTs today  Component     Latest Ref Rng & Units 08/15/2017  Thyroglobulin     ng/mL 1.8 (L)  Comment        Thyroglobulin Ab     < or = 1 IU/mL <1  TSH     0.35 - 4.50 uIU/mL 0.61  T4,Free(Direct)     0.60 - 1.60 ng/dL 1.04  TFTs normal and Tg lower!  [name], MD PhD [facility]

## 2017-08-16 LAB — THYROGLOBULIN LEVEL: Thyroglobulin: 1.8 ng/mL — ABNORMAL LOW

## 2017-08-16 LAB — POCT GLYCOSYLATED HEMOGLOBIN (HGB A1C): Hemoglobin A1C: 9.1

## 2017-08-16 LAB — T4, FREE: FREE T4: 1.04 ng/dL (ref 0.60–1.60)

## 2017-08-16 LAB — THYROGLOBULIN ANTIBODY

## 2017-08-16 LAB — TSH: TSH: 0.61 u[IU]/mL (ref 0.35–4.50)

## 2017-10-04 ENCOUNTER — Other Ambulatory Visit: Payer: Self-pay | Admitting: Internal Medicine

## 2017-11-13 ENCOUNTER — Encounter: Payer: Self-pay | Admitting: Internal Medicine

## 2017-11-13 ENCOUNTER — Ambulatory Visit (INDEPENDENT_AMBULATORY_CARE_PROVIDER_SITE_OTHER): Payer: Self-pay | Admitting: Internal Medicine

## 2017-11-13 VITALS — BP 132/72 | HR 81 | Temp 97.8°F | Ht 67.5 in | Wt 271.6 lb

## 2017-11-13 DIAGNOSIS — E1165 Type 2 diabetes mellitus with hyperglycemia: Secondary | ICD-10-CM

## 2017-11-13 DIAGNOSIS — IMO0002 Reserved for concepts with insufficient information to code with codable children: Secondary | ICD-10-CM

## 2017-11-13 DIAGNOSIS — E890 Postprocedural hypothyroidism: Secondary | ICD-10-CM

## 2017-11-13 DIAGNOSIS — C73 Malignant neoplasm of thyroid gland: Secondary | ICD-10-CM

## 2017-11-13 DIAGNOSIS — E1121 Type 2 diabetes mellitus with diabetic nephropathy: Secondary | ICD-10-CM

## 2017-11-13 LAB — POCT GLYCOSYLATED HEMOGLOBIN (HGB A1C): HEMOGLOBIN A1C: 8.3

## 2017-11-13 MED ORDER — LEVOTHYROXINE SODIUM 150 MCG PO TABS: 150.0000 ug | ORAL_TABLET | Freq: Every day | ORAL | 3 refills | Status: DC

## 2017-11-13 NOTE — Patient Instructions (Addendum)
Please continue: Insulin Before breakfast Before lunch Before dinner  Regular 35 10 35  NPH 40  30   Please take only 25 units of R insulin if sugars before meals <80. If sugars < or = 60 before meals, skip R insulin with that meal.  Please return in 3 months with your sugar log.

## 2017-11-13 NOTE — Progress Notes (Signed)
Patient ID: Courtney [name], female   DOB: 03/18/1956, 62 y.o.   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 Last visit 3 months ago.  She had a UTI 2 weeks ago >> tx'ed with ABx, now URI now.  DM2: Last hemoglobin A1c was: Lab Results  Component Value Date   HGBA1C 9.1 08/16/2017   HGBA1C 11.3 05/31/2017   HGBA1C 11.9 02/24/2017  08/22/2016: HbA1c 11.8% 08/01/2014: HbA1c 14.5% 05/02/2014: HbA1c 14.8% 01/31/2014: HbA1c 13.6% 11/01/2013: HbA1c 15.1%  She was on: - Tresiba U200 70 units in am - ReliOn Novolin to 2-3x a day: - before breakfast and dinner:  20 units with a regular meal  25 units before a larger meal  (15 units before lunch if you eat lunch) - not using Stopped Metformin 500 mg 2x a day - b/c leg swelling. She was on Novolin 70/30 48 units 2x a day 15 min before the meals  She was on Amaryl 8 mg in am She was on Metformin >> stopped 2/2 CKD.  Then on: - ReliOn Novolin to 2x a day: 50 units with B and D and occas. 10 with lunch; ran out of [place]..   She is currently on:  Insulin Before breakfast Before lunch Before dinner  Regular 35 10 35  NPH 40  30   She checks sugars 1-2 times a day: - am: 121-232 >> 63, 88-92 >> 84-168, 182 (no shot), 200s if skip evening insulin, or has snack after dinner or late dinner - 2h after b'fast: n/c - before lunch:  81-134 >> 148, 170 >> 189 >> n/c >> 75, 113, 165 - 2h after lunch: n/c >> 67, 228 >> n/c - before dinner:  154-220 >> 108-286 >> 130-180 >> 61, 70-138, 215 (cold) - 2h after dinner: n/c >> 131, 269 >> n/c - bedtime: n/c >> 81-160 >> n/c >> 240s >> n/c  - nighttime: n/c  Lowest sugar was 63 >> 58 (before dinner - sick); it is unclear at which level she has hypoglycemia awareness. Highest  sugar was 242 >> 296 (soda).  Glucometer: Prodigy >> ReliOn  Pt's meals are: - Breakfast: chicken  - Lunch: peanuts, sandwich + crystal lite drink - Dinner: frozen dinner - Snacks: fruit, small bag potato chips, peanuts  -Stage III CKD, BUN/creatinine:  08/22/2016: BUN/creatinine 20/1.41 03/18/2016: BUN/creatinine 16/1.21, EGFR 57, Calcium 8.7, AST/ALT 17/24 09/18/2015: BUN/creatinine 14/1.2, GFR 57 (previously 55) 08/01/2014: 15/1.26, GFR 55 On Cozaar.  - Lipids: 03/18/2016: Lipids: 156/85/70/69 09/18/2015: 160/57/87/62 08/01/2014: 145/78/74/55 On lovastatin 20..  - last eye exam was in 12/2016: + DR;  had laser surgeries; getting intraocular injections; L eye cataract >> had cataract sx 02/25/2015.  - She has numbness and tingling in her right foot latest foot exam was performed at last visit..   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  - She did not have RAI treatment at that time, however, she was found to have a thyroid mass in 2005 >> This was biopsied and returned as normal thyroid tissue, but had RAI treatment at that time.  - The posttreatment whole-body scan was negative for any  cancer spread.   Thyroglobulin returned at 2.3 in 08/2014 >> I ordered a neck ultrasound that showed a stable nodule with a fatty hilum, consistent with a benign lymph node, but no other masses in the surgical bed.   The thyroglobulin level remained detectable, and was actually 9.9 in 10/2015.   A neck ultrasound (11/2015) showed a stable 1.4 nodule in the thyroid bed, consistent with a benign process  Reviewed latest pertinent labs: Thyroglobulin is still detectable, but improving: Lab Results  Component Value Date   THYROGLB 1.8 (L) 08/15/2017   THYROGLB 2.5 (L) 08/29/2016    THYROGLB 9.9 11/06/2015   THYROGLB 2.4 (L) 09/18/2015   THYROGLB 0.8 (L) 12/26/2014   THYROGLB 2.3 (L) 08/22/2014   THGAB <1 08/15/2017   THGAB <1 08/29/2016   THGAB <1 09/18/2015   THGAB <1.0 12/26/2014   THGAB <1 08/22/2014   THGAB <1.0 08/22/2014   Postsurgical hypothyroidism:  Reviewed TSH levels: Normal: Lab Results  Component Value Date   TSH 0.61 08/15/2017   TSH 1.82 08/29/2016   TSH 1.03 09/18/2015   TSH 2.72 12/26/2014   FREET4 1.04 08/15/2017   FREET4 1.30 08/29/2016   FREET4 1.13 09/18/2015   FREET4 1.00 12/26/2014  08/01/2014: TSH 1.89  Pt is on levothyroxine 150 mcg daily, taken: - in am - fasting - at least 30 min from b'fast - no Ca, Fe, MVI, PPIs - not on Biotin  ROS: Constitutional: no weight gain/no weight loss, no fatigue, no subjective hyperthermia, no subjective hypothermia Eyes: no blurry vision, no xerophthalmia ENT: no sore throat, no nodules palpated in throat, no dysphagia, no odynophagia, no hoarseness Cardiovascular: no CP/no SOB/no palpitations/no leg swelling Respiratory: no cough/no SOB/no wheezing Gastrointestinal: no N/no V/no D/no C/no acid reflux Musculoskeletal: no muscle aches/no joint aches Skin: no rashes, no hair loss Neurological: no tremors/no numbness/no tingling/no dizziness  I reviewed pt's medications, allergies, PMH, social hx, family hx, and changes were documented in the history of present illness. Otherwise, unchanged from my initial visit note.  Past Medical History:  Diagnosis Date  . Anemia   . Arthritis   . Chronic kidney disease   . Diabetes mellitus without complication ([place])   . Hypertension   . MRSA infection 2013   on leg; surgery   . Neuropathy due to secondary diabetes ([place])   . Thyroid disease    Past Surgical History:  Procedure Laterality Date  . BILATERAL CARPAL TUNNEL RELEASE Bilateral 2003  . CATARACT EXTRACTION W/ INTRAOCULAR LENS IMPLANT Left 2016  . CHOLECYSTECTOMY    . CORNEAL  TRANSPLANT  30+ yrs ago  . EYE SURGERY    . KNEE ARTHROSCOPY Left 05/05/2016   Procedure: ARTHROSCOPY KNEE;  Surgeon: Thornton Park, MD;  Location: [facility] ORS;  Service: Orthopedics;  Laterality: Left;  . SHOULDER ARTHROSCOPY Left 2004  . THYROID SURGERY  2004  . TRIGGER FINGER RELEASE  2003   History   Social History Main Topics  . Smoking status: Former Research scientist (life sciences)  . Smokeless tobacco: Not on file  . Alcohol Use: No  . Drug Use: No   Social History Narrative   Single   0 children   [place]travel[place])      Beer and wine on occasion   First menstrual cycle: 6th grade   Postmenopausal      Current Outpatient Medications  Medication Sig Dispense Refill  . amLODipine (NORVASC) 10 MG tablet Take 10 mg by mouth daily. Reported on 11/06/2015    .  hydrochlorothiazide (HYDRODIURIL) 25 MG tablet Take 25 mg by mouth daily.    . insulin NPH Human (HUMULIN N,NOVOLIN N) 100 UNIT/ML injection Inject 40 units in am and 30 units before dinner 20 mL 11  . insulin regular (NOVOLIN R RELION) 100 units/mL injection INJECT 10-35 UNITS SUB-Q 3 TIMES DAILY BEFORE MEALS. ReliOn insulin. 20 mL 11  . Insulin Syringe-Needle U-100 31G X 15/64" 0.5 ML MISC Use 3x a day 100 each 11  . levothyroxine (SYNTHROID, LEVOTHROID) 150 MCG tablet Take 150 mcg by mouth daily before breakfast.     . losartan (COZAAR) 100 MG tablet Take 100 mg by mouth daily.     Marland Kitchen lovastatin (MEVACOR) 20 MG tablet Take 20 mg by mouth at bedtime.    Marland Kitchen NOVOLIN R RELION 100 UNIT/ML injection INJECT 20 TO 25 UNITS SUB-Q THREE TIMES A DAY BEFORE MEALS. 10 mL 11  . pregabalin (LYRICA) 50 MG capsule Take 50 mg by mouth 3 (three) times daily.     No current facility-administered medications for this visit.    NKDA   Family History  Problem Relation Age of Onset  . Hypertension Mother   . Thyroid disease Mother   . CVA Mother   . Hypertension Sister   . Thyroid disease Sister   . Hypertension Brother   . Hyperlipidemia  Brother    PE: BP 132/72 (BP Location: Right Arm, Patient Position: Sitting, Cuff Size: Normal)   Pulse 81   Temp 97.8 F (36.6 C) (Oral)   Ht 5' 7.5" (1.715 m)   Wt 271 lb 9.6 oz (123.2 kg)   SpO2 98%   BMI 41.91 kg/m  Body mass index is 41.91 kg/m. Wt Readings from Last 3 Encounters:  11/13/17 271 lb 9.6 oz (123.2 kg)  08/15/17 277 lb 6.4 oz (125.8 kg)  05/31/17 268 lb (121.6 kg)   Constitutional: overweight, in NAD Eyes: PERRLA, EOMI, no exophthalmos ENT: moist mucous membranes, no thyromegaly, no cervical lymphadenopathy Cardiovascular: RRR, No MRG, R leg not swollen anymore Respiratory: CTA B Gastrointestinal: abdomen soft, NT, ND, BS+ Musculoskeletal: no deformities, strength intact in all 4 Skin: moist, warm, no rashes Neurological: no tremor with outstretched hands, DTR normal in all 4  ASSESSMENT: 1. DM2, insulin-dependent, uncontrolled, with complications - CKD - DR  2. 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 She did not have RAI treatment at that time, however, she was found to have a thyroid mass in 2005. This was biopsied and returned as normal thyroid tissue.She had RAI treatment at that time. The posttreatment whole-body scan was negative for any cancer spread.   - Neck U/S (09/19/2014): 1. Post total thyroidectomy. 2. No change in the previously biopsied approximately 1.4 cm echogenic solid nodule within the inferior aspect of the right lobe of the thyroid, grossly unchanged since the 2005 examination. 3. Apparent development of an approximately 0.4 cm hypoechoic nodule within the right thyroidectomy bed - while too small for definitive characterization, this nodule appears to contain an echogenic hilum and thus is favored to represent a non pathologically enlarged cervical lymph node.  -  Neck U/S (11/27/2015):  1. Surgical changes of prior total thyroidectomy. 2. Unchanged 1.4 cm echogenic soft tissue nodule in the right thyroid resection bed. Continued stability over time consistent with a benign process.  3. Post surgical hypothyroidism  PLAN:  1. Patient with long-standing, uncontrolled, type 2 diabetes, insulin-dependent, previously on analog insulin regimen, but currently on NPH and regular insulin due to cost.  At last visit, HbA1c returned much better, at 9.1%, decreased from 11.3% at the previous visit in which she was not checking sugars.  At last visit, we added a low dose of regular insulin before lunch, as her sugars were higher before dinner in the days in which she had lunch.  Her sugars were lower in the morning so we decreased her NPH at night. - At this visit, her sugars are better later in the day, but that is still quite fluctuating in the morning  - she usually is aware about the reasons of.  High blood sugars in the morning:  she either forgets the dose of insulin at night or has a late dinner, or a snack after dinner.  She is working on improving these.   - She occasionally has a slightly low blood sugar during the day >> discussed about changing the dose of regular insulin based on the sugars before meals - I advised her to: Patient Instructions   Please continue: Insulin Before breakfast Before lunch Before dinner  Regular 35 10 35  NPH 40  30   Please take only 25 units of R insulin if sugars before meals <80. If sugars < or = 60 before meals, skip R insulin with that meal.  Please return in 3 months with your sugar log.   - today, HbA1c is 8.3% (better, but she had been a long while) - continue checking sugars at different times of the day - check 3x a day, rotating checks - advised for yearly eye exams >> she is UTD - will need annual labs at next visit - If not done by PCP - Return to clinic in 3 mo with sugar log      2. Thyroid cancer, in  remission -Patient with subcentimeter follicular variant of papillary thyroid cancer, multifocal, likely completely cured after her thyroidectomy, especially since she also had RAI treatment a year later (2005).  Her latest thyroid ultrasound was in 11/2015 and it showed a stable mass (since 2015) in the thyroid fossa, consistent with a benign process. -She has no neck compression symptoms -Thyroglobulin obtained at last visit was improved, and her antithyroglobulin antibodies were undetectable.  3. Postsurgical hypothyroidism - latest thyroid labs reviewed with pt >> normal  - she continues on LT4 150 mcg daily - pt feels good on this dose. - we discussed about taking the thyroid hormone every day, with water, >30 minutes before breakfast, separated by >4 hours from acid reflux medications, calcium, iron, multivitamins. Pt. is taking it correctly.  [name], MD PhD [facility] Endocrinology

## 2018-02-14 ENCOUNTER — Encounter: Payer: Self-pay | Admitting: Internal Medicine

## 2018-02-14 ENCOUNTER — Ambulatory Visit: Payer: Self-pay | Admitting: Internal Medicine

## 2018-02-14 VITALS — BP 118/60 | HR 81 | Ht 67.5 in | Wt 273.6 lb

## 2018-02-14 DIAGNOSIS — IMO0002 Reserved for concepts with insufficient information to code with codable children: Secondary | ICD-10-CM

## 2018-02-14 DIAGNOSIS — E1165 Type 2 diabetes mellitus with hyperglycemia: Secondary | ICD-10-CM

## 2018-02-14 DIAGNOSIS — E890 Postprocedural hypothyroidism: Secondary | ICD-10-CM

## 2018-02-14 DIAGNOSIS — C73 Malignant neoplasm of thyroid gland: Secondary | ICD-10-CM

## 2018-02-14 DIAGNOSIS — E1121 Type 2 diabetes mellitus with diabetic nephropathy: Secondary | ICD-10-CM

## 2018-02-14 LAB — POCT GLYCOSYLATED HEMOGLOBIN (HGB A1C): Hemoglobin A1C: 8.6 % — AB (ref 4.0–5.6)

## 2018-02-14 NOTE — Progress Notes (Signed)
Patient ID: Courtney [name], female   DOB: 10-19-1955, 62 y.o.   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 Last visit 3 months ago.  She had IO injections since last visit.  She again had R leg swelling and also diarrhea since last visit.  DM2: Last hemoglobin A1c was: Lab Results  Component Value Date   HGBA1C 8.3 11/13/2017   HGBA1C 9.1 08/16/2017   HGBA1C 11.3 05/31/2017  08/22/2016: HbA1c 11.8% 08/01/2014: HbA1c 14.5% 05/02/2014: HbA1c 14.8% 01/31/2014: HbA1c 13.6% 11/01/2013: HbA1c 15.1%  She was on: - Tresiba U200 70 units in am - ReliOn Novolin to 2-3x a day: - before breakfast and dinner:  20 units with a regular meal  25 units before a larger meal  (15 units before lunch if you eat lunch) - not using Stopped Metformin 500 mg 2x a day - b/c leg swelling. She was on Novolin 70/30 48 units 2x a day 15 min before the meals  She was on Amaryl 8 mg in am She was on Metformin >> stopped 2/2 CKD.  Then on: - ReliOn Novolin to 2x a day: 50 units with B and D and occas. 10 with lunch; ran out of [place]..   She is currently on:  Insulin Before breakfast Before lunch Before dinner  Regular 35 10 35  NPH 40  30  Please take only 25 units of R insulin if sugars before meals <80. If sugars < or = 60 before meals, skip R insulin with that meal.  She checks sugars 1-2 X a day: - am:  63, 88-92 >> 84-168, 182 (no shot), 200s >> 68, 80-162, 202 (no inj at night), 227 (sweet tea), 260 (late dinner) - 2h after b'fast: n/c - before lunch:  189 >> n/c >> 75, 113, 165 >> 114, 192, 229 - 2h after lunch: n/c >> 67, 228 >> n/c - before dinner:   130-180 >> 61, 70-138, 215 (cold) >> 75-192 (soda), 259 (no inj for lunch) - 2h after dinner: n/c >> 131, 269 >>  n/c - bedtime:81-160 >> n/c >> 240s >> n/c  - nighttime: n/c  Lowest sugar was 58 (before dinner - sick) >> 68; it is unclear at which level she has hypoglycemia awareness Highest sugar was 296 (soda) >> 260.  Glucometer: Prodigy >> ReliOn  Pt's meals are: - Breakfast: chicken  - Lunch: peanuts, sandwich + crystal lite drink - Dinner: frozen dinner - Snacks: fruit, small bag potato chips, peanuts  -+ Stage III CKD, BUN/creatinine:  08/22/2016: BUN/creatinine 20/1.41 03/18/2016: BUN/creatinine 16/1.21, EGFR 57, Calcium 8.7, AST/ALT 17/24 09/18/2015: BUN/creatinine 14/1.2, GFR 57 (previously 55) 08/01/2014: 15/1.26, GFR 55 On Cozaar.  -+ HL; lipids: 03/18/2016: Lipids: 156/85/70/69 09/18/2015: 160/57/87/62 08/01/2014: 145/78/74/55 On lovastatin 20.   - last eye exam was in 01/2018: + DR; she had laser surgeries, intraocular injections, left eye cataract surgery   - + numbness and tingling in her right foot latest foot exam was performed at last visit..   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  - She did not have RAI treatment at that time, however, she was  found to have a thyroid mass in 2005 >> This was biopsied and returned as normal thyroid tissue, but had RAI treatment at that time.  - The posttreatment whole-body scan was negative for any cancer spread.   Thyroglobulin returned at 2.3 in 08/2014 >> I ordered a neck ultrasound that showed a stable nodule with a fatty hilum, consistent with a benign lymph node, but no other masses in the surgical bed.   The thyroglobulin level remained detectable, and was actually 9.9 in 10/2015.   A neck ultrasound (11/2015) showed a stable 1.4 nodule in the thyroid bed, consistent with a benign process    Thyroglobulin is detectable, but improving  Reviewed pertinent labs: Lab Results  Component Value Date   THYROGLB 1.8 (L) 08/15/2017   THYROGLB 2.5 (L) 08/29/2016   THYROGLB 9.9 11/06/2015   THYROGLB 2.4 (L) 09/18/2015   THYROGLB 0.8 (L) 12/26/2014   THYROGLB 2.3 (L) 08/22/2014   THGAB <1 08/15/2017   THGAB <1 08/29/2016   THGAB <1 09/18/2015   THGAB <1.0 12/26/2014   THGAB <1 08/22/2014   THGAB <1.0 08/22/2014   Postsurgical hypothyroidism:  Reviewed patient's TFTs: At goal: Lab Results  Component Value Date   TSH 0.61 08/15/2017   TSH 1.82 08/29/2016   TSH 1.03 09/18/2015   TSH 2.72 12/26/2014   FREET4 1.04 08/15/2017   FREET4 1.30 08/29/2016   FREET4 1.13 09/18/2015   FREET4 1.00 12/26/2014  08/01/2014: TSH 1.89  Pt is on levothyroxine 150 mcg daily, taken: - in am - fasting - at least 30 min from b'fast - no Ca, Fe, MVI, PPIs - not on Biotin  ROS: Constitutional: no weight gain/no weight loss, no fatigue, no subjective hyperthermia, no subjective hypothermia Eyes: no blurry vision, no xerophthalmia ENT: no sore throat, no nodules palpated in throat, no dysphagia, no odynophagia, no hoarseness Cardiovascular: no CP/no SOB/no palpitations/no leg swelling Respiratory: no cough/no SOB/no wheezing Gastrointestinal: no N/no V/no D/no C/no acid reflux Musculoskeletal: no muscle aches/no joint aches Skin: no rashes, no hair loss Neurological: no tremors/+ numbness/+ tingling/no dizziness  I reviewed pt's medications, allergies, PMH, social hx, family hx, and changes were documented in the history of present illness. Otherwise, unchanged from my initial visit note.  Past Medical History:  Diagnosis Date  . Anemia   . Arthritis   . Chronic kidney disease   . Diabetes mellitus without complication ([place])   . Hypertension   . MRSA infection 2013   on leg; surgery   . Neuropathy due to secondary diabetes ([place])   . Thyroid disease    Past Surgical History:   Procedure Laterality Date  . BILATERAL CARPAL TUNNEL RELEASE Bilateral 2003  . CATARACT EXTRACTION W/ INTRAOCULAR LENS IMPLANT Left 2016  . CHOLECYSTECTOMY    . 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 EYE SURGERY    . KNEE ARTHROSCOPY Left 05/05/2016   Procedure: ARTHROSCOPY KNEE;  Surgeon: Thornton Park, MD;  Location: [facility] ORS;  Service: Orthopedics;  Laterality: Left;  . SHOULDER ARTHROSCOPY Left 2004  . THYROID SURGERY  2004  . TRIGGER FINGER RELEASE  2003   History   Social History Main Topics  . Smoking status: Former Research scientist (life sciences)  . Smokeless tobacco: Not on file  . Alcohol Use: No  . Drug Use: No   Social History Narrative   Single   0 children   [place]travel[place]      Beer and wine on occasion   First menstrual cycle: 6th  grade   Postmenopausal      Current Outpatient Medications  Medication Sig Dispense Refill  . amLODipine (NORVASC) 10 MG tablet Take 10 mg by mouth daily. Reported on 11/06/2015    . hydrochlorothiazide (HYDRODIURIL) 25 MG tablet Take 25 mg by mouth daily.    . insulin NPH Human (HUMULIN N,NOVOLIN N) 100 UNIT/ML injection Inject 40 units in am and 30 units before dinner 20 mL 11  . insulin regular (NOVOLIN R RELION) 100 units/mL injection INJECT 10-35 UNITS SUB-Q 3 TIMES DAILY BEFORE MEALS. ReliOn insulin. 20 mL 11  . Insulin Syringe-Needle U-100 31G X 15/64" 0.5 ML MISC Use 3x a day 100 each 11  . levothyroxine (SYNTHROID, LEVOTHROID) 150 MCG tablet Take 1 tablet (150 mcg total) by mouth daily before breakfast. 90 tablet 3  . losartan (COZAAR) 100 MG tablet Take 100 mg by mouth daily.     Marland Kitchen lovastatin (MEVACOR) 20 MG tablet Take 20 mg by mouth at bedtime.    Marland Kitchen NOVOLIN R RELION 100 UNIT/ML injection INJECT 20 TO 25 UNITS SUB-Q THREE TIMES A DAY BEFORE MEALS. 10 mL 11   No current facility-administered medications for this visit.    NKDA   Family History  Problem Relation Age of Onset  . Hypertension Mother   . Thyroid disease  Mother   . CVA Mother   . Hypertension Sister   . Thyroid disease Sister   . Hypertension Brother   . Hyperlipidemia Brother    PE: BP 118/60   Pulse 81   Ht 5' 7.5" (1.715 m)   Wt 273 lb 9.6 oz (124.1 kg)   SpO2 98%   BMI 42.22 kg/m  Body mass index is 42.22 kg/m. Wt Readings from Last 3 Encounters:  02/14/18 273 lb 9.6 oz (124.1 kg)  11/13/17 271 lb 9.6 oz (123.2 kg)  08/15/17 277 lb 6.4 oz (125.8 kg)   Constitutional: overweight, in NAD Eyes: PERRLA, EOMI, no exophthalmos ENT: moist mucous membranes, thyroidectomy scar healed, no masses palpated in the neck, no cervical lymphadenopathy Cardiovascular: RRR, No MRG Respiratory: CTA B Gastrointestinal: abdomen soft, NT, ND, BS+ Musculoskeletal: no deformities, strength intact in all 4 Skin: moist, warm, no rashes Neurological: no tremor with outstretched hands, DTR normal in all 4  ASSESSMENT: 1. DM2, insulin-dependent, uncontrolled, with complications - CKD - DR  2. 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 She did not have RAI treatment at that time, however, she was found to have a thyroid mass in 2005. This was biopsied and returned as normal thyroid tissue.She had RAI treatment at that time. The posttreatment whole-body scan was negative for any cancer spread.   - Neck U/S (09/19/2014): 1. Post total thyroidectomy. 2. No change in the previously biopsied approximately 1.4 cm echogenic solid nodule within the inferior aspect of the right lobe of the thyroid, grossly unchanged since the 2005 examination. 3. Apparent development of an approximately 0.4 cm hypoechoic nodule within the right thyroidectomy bed - while too small for definitive characterization, this nodule appears to contain an echogenic hilum and thus is favored to represent a non  pathologically enlarged cervical lymph node.  - Neck U/S (11/27/2015):  1. Surgical changes of prior total thyroidectomy. 2. Unchanged 1.4 cm echogenic soft tissue nodule in the right thyroid resection bed. Continued stability over time consistent with a benign  process.  3. Post surgical hypothyroidism  PLAN:  1. Patient with long-standing, uncontrolled, type 2 diabetes, on basal-bolus insulin regimen, with NPH and regular insulin.  She has had high blood sugars in the recent past, with an HbA1c of 11.3%, which then decreased to 9.1%.  At last visit, this improved further, to 8.3%.  Sugars are quite fluctuating in the morning at last visit, but improved later in the day.  I advised her at that time to write down the reasons for high blood sugars in the morning.  We discussed some of the possible reasons: Forgetting the dose of insulin at night, late dinners, snacks after dinner.  She continues working on improving these. - sugars are great if controlling her diet but are high with dietary indiscretions and late dinners >> discussed to use a larger R insulin if having a large/late dinner - no lows >> will continue same insulin doses - I advised her to: Patient Instructions   Please continue the following regimen, but use a higher dose of R insulin for a larger/later dinner: Insulin Before breakfast Before lunch Before dinner  Regular 35 10 35-40  NPH 40  30  Please take only 25 units of R insulin if sugars before meals <80. If sugars < or = 60 before meals, skip R insulin with that meal.  Please continue Levothyroxine 150 mcg daily.  Take the thyroid hormone every day, with water, at least 30 minutes before breakfast, separated by at least 4 hours from: - acid reflux medications - calcium - iron - multivitamins  Please return in 4 months with your sugar log.   - today, HbA1c is 8.6% (higher) - continue checking sugars at different times of the day - check 3x a day, rotating  checks - advised for yearly eye exams >> she is UTD - Return to clinic in 4 mo with sugar log      2. Thyroid cancer, in remission -Patient with subcentimeter follicular variant of papillary thyroid cancer, multifocal, likely completely cured after her thyroidectomy, especially since she also had RAI treatment a year later, in 2005.  Her latest thyroid ultrasound was in 11/2015 and it showed a stable mass, since 2015, in the thyroidal fossa, consistent with a benign process.  - Thyroglobulin obtained at last check was improved, and her antithyroglobulin antibodies were undetectable.   - no neck compression sxs  3. Postsurgical hypothyroidism - latest thyroid labs reviewed with pt >> normal in 08/2017 - she continues on LT4 150 mcg daily - pt feels good on this dose. - we discussed about taking the thyroid hormone every day, with water, >30 minutes before breakfast, separated by >4 hours from acid reflux medications, calcium, iron, multivitamins. Pt. is taking it correctly. - will check thyroid tests at next visit  [name], MD PhD [facility]

## 2018-02-14 NOTE — Patient Instructions (Addendum)
Please continue the following regimen, but use a higher dose of R insulin for a larger/later dinner: Insulin Before breakfast Before lunch Before dinner  Regular 35 10 35-40  NPH 40  30  Please take only 25 units of R insulin if sugars before meals <80. If sugars < or = 60 before meals, skip R insulin with that meal.  Please continue Levothyroxine 150 mcg daily.  Take the thyroid hormone every day, with water, at least 30 minutes before breakfast, separated by at least 4 hours from: - acid reflux medications - calcium - iron - multivitamins  Please return in 4 months with your sugar log.

## 2018-02-14 NOTE — Addendum Note (Signed)
Addended by: Drucilla Schmidt on: 02/14/2018 01:01 PM   Modules accepted: Orders

## 2018-06-25 ENCOUNTER — Encounter: Payer: Self-pay | Admitting: Internal Medicine

## 2018-06-25 ENCOUNTER — Ambulatory Visit: Payer: Self-pay | Admitting: Internal Medicine

## 2018-06-25 VITALS — BP 130/70 | HR 78 | Ht 67.5 in | Wt 273.0 lb

## 2018-06-25 DIAGNOSIS — E1165 Type 2 diabetes mellitus with hyperglycemia: Secondary | ICD-10-CM

## 2018-06-25 DIAGNOSIS — E1142 Type 2 diabetes mellitus with diabetic polyneuropathy: Secondary | ICD-10-CM | POA: Insufficient documentation

## 2018-06-25 DIAGNOSIS — IMO0002 Reserved for concepts with insufficient information to code with codable children: Secondary | ICD-10-CM

## 2018-06-25 DIAGNOSIS — E890 Postprocedural hypothyroidism: Secondary | ICD-10-CM

## 2018-06-25 DIAGNOSIS — E1121 Type 2 diabetes mellitus with diabetic nephropathy: Secondary | ICD-10-CM

## 2018-06-25 DIAGNOSIS — Z794 Long term (current) use of insulin: Secondary | ICD-10-CM

## 2018-06-25 DIAGNOSIS — C73 Malignant neoplasm of thyroid gland: Secondary | ICD-10-CM

## 2018-06-25 LAB — POCT GLYCOSYLATED HEMOGLOBIN (HGB A1C): Hemoglobin A1C: 10.1 % — AB (ref 4.0–5.6)

## 2018-06-25 LAB — LIPID PANEL
CHOL/HDL RATIO: 2
Cholesterol: 134 mg/dL (ref 0–200)
HDL: 70.2 mg/dL (ref >39.00)
LDL Cholesterol: 54 mg/dL (ref 0–99)
NONHDL: 63.73
Triglycerides: 47 mg/dL (ref 0.0–149.0)
VLDL: 9.4 mg/dL (ref 0.0–40.0)

## 2018-06-25 LAB — TSH: TSH: 1.39 u[IU]/mL (ref 0.35–4.50)

## 2018-06-25 LAB — T4, FREE: Free T4: 0.99 ng/dL (ref 0.60–1.60)

## 2018-06-25 NOTE — Patient Instructions (Addendum)
Please use the following insulin regimen: Insulin Before breakfast Before lunch Before dinner  Regular 35 10 35-40  NPH 40  30  Please take only 25 units of R insulin if sugars before meals <80. If sugars < or = 60 before meals, skip R insulin with that meal. Do not skip N insulin.  NO REGULAR SODAS AND SWEET TEA.  DO NOT MISS INSULIN.   Please continue Levothyroxine 150 mcg daily.  Take the thyroid hormone every day, with water, at least 30 minutes before breakfast, separated by at least 4 hours from: - acid reflux medications - calcium - iron - multivitamins  Please return in 4 months with your sugar log.

## 2018-06-25 NOTE — Progress Notes (Signed)
Patient ID: Courtney [name], female   DOB: 1956-06-04, 62 y.o.   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 Last visit 4 months ago.  She has mm cramps >> started Mg.  Started an exercise class once a week.  DM2: Last hemoglobin A1c was: Lab Results  Component Value Date   HGBA1C 8.6 (A) 02/14/2018   HGBA1C 8.3 11/13/2017   HGBA1C 9.1 08/16/2017  08/22/2016: HbA1c 11.8% 08/01/2014: HbA1c 14.5% 05/02/2014: HbA1c 14.8% 01/31/2014: HbA1c 13.6% 11/01/2013: HbA1c 15.1%  She was on: - Tresiba U200 70 units in am - ReliOn Novolin to 2-3x a day: - before breakfast and dinner:  20 units with a regular meal  25 units before a larger meal  (15 units before lunch if you eat lunch) - not using Stopped Metformin 500 mg 2x a day - b/c leg swelling. She was on Novolin 70/30 48 units 2x a day 15 min before the meals  She was on Amaryl 8 mg in am She was on Metformin >> stopped 2/2 CKD.  Then on: - ReliOn Novolin to 2x a day: 50 units with B and D and occas. 10 with lunch; ran out of [place]..   She is currently on:  Insulin Before breakfast Before lunch Before dinner  Regular 35 10 35-40  NPH 40  30  Please take only 25 units of R insulin if sugars before meals <80. If sugars < or = 60 before meals, skip R insulin with that meal.  She checks sugars 1-2 times a day: - am: 68, 80-162, 202 , 227, 260 >> 103-250 (higher when missed NPH the night before) - 2h after b'fast: n/c - before lunch: 75, 113, 165 >> 114, 192, 229 >> n/c - 2h after lunch: n/c >> 67, 228 >> n/c - before dinner:   75-192, 259 >> 98, 115, 157, 251, 383 (sweet drinks) - 2h after dinner: n/c >> 131, 269 >> n/c - bedtime:81-160 >> n/c >> 240s >> n/c  - nighttime: n/c  Lowest sugar was 58 >> 68 >>  103; it is unclear at which level she has hypoglycemia awareness Highest sugar was 296 (soda) >> 260 >> 383.  Glucometer: Prodigy >> ReliOn  Pt's meals are: - Breakfast: chicken  - Lunch: peanuts, sandwich + crystal lite drink - Dinner: frozen dinner - Snacks: fruit, small bag potato chips, peanuts  -+ Stage III CKD, BUN/creatinine:  08/22/2016: BUN/creatinine 20/1.41 03/18/2016: BUN/creatinine 16/1.21, EGFR 57, Calcium 8.7, AST/ALT 17/24 09/18/2015: BUN/creatinine 14/1.2, GFR 57 (previously 55) 08/01/2014: 15/1.26, GFR 55 On Cozaar.  -+ HL; lipids: No results found for: CHOL, HDL, LDLCALC, LDLDIRECT, TRIG, CHOLHDL 03/18/2016: Lipids: 156/85/70/69 09/18/2015: 160/57/87/62 08/01/2014: 145/78/74/55 On lovastatin 20.   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  - She did not have RAI treatment at that time, however, she was found to have a thyroid mass in 2005 >> This was biopsied and returned as normal thyroid  tissue, but had RAI treatment at that time.  - The posttreatment whole-body scan was negative for any cancer spread.   Thyroglobulin returned at 2.3 in 08/2014 >> I ordered a neck ultrasound that showed a stable nodule with a fatty hilum, consistent with a benign lymph node, but no other masses in the surgical bed.   The thyroglobulin level remained detectable, and was actually 9.9 in 10/2015.   A neck ultrasound (11/2015) showed a stable 1.4 nodule in the thyroid bed, consistent with a benign process   Thyroglobulin was detectable but improving at last check.  Reviewed pertinent labs: Lab  Results  Component Value Date   THYROGLB 1.8 (L) 08/15/2017   THYROGLB 2.5 (L) 08/29/2016   THYROGLB 9.9 11/06/2015   THYROGLB 2.4 (L) 09/18/2015   THYROGLB 0.8 (L) 12/26/2014   THYROGLB 2.3 (L) 08/22/2014   THGAB <1 08/15/2017   THGAB <1 08/29/2016   THGAB <1 09/18/2015   THGAB <1.0 12/26/2014   THGAB <1 08/22/2014   THGAB <1.0 08/22/2014   Postsurgical hypothyroidism:  Reviewed patient's TFTs: Lab Results  Component Value Date   TSH 0.61 08/15/2017   TSH 1.82 08/29/2016   TSH 1.03 09/18/2015   TSH 2.72 12/26/2014   FREET4 1.04 08/15/2017   FREET4 1.30 08/29/2016   FREET4 1.13 09/18/2015   FREET4 1.00 12/26/2014  08/01/2014: TSH 1.89  Pt is on levothyroxine 150 mcg daily, taken: - in am - fasting - at least 30 min from b'fast - no Ca, Fe, MVI, PPIs - not on Biotin  No FH of thyroid cancer. No h/o radiation tx to head or neck.  No herbal supplements. No Biotin use. No recent steroids use.   ROS: Constitutional: no weight gain/no weight loss, no fatigue, no subjective hyperthermia, no subjective hypothermia Eyes: no blurry vision, no xerophthalmia ENT: no sore throat, + see HPI Cardiovascular: no CP/no SOB/no palpitations/no leg swelling Respiratory: no cough/no SOB/no wheezing Gastrointestinal: no N/no V/no D/no C/no acid reflux Musculoskeletal: + muscle cramps/no joint aches Skin: no rashes, no hair loss Neurological: no tremors/+ numbness/+ tingling/no dizziness  I reviewed pt's medications, allergies, PMH, social hx, family hx, and changes were documented in the history of present illness. Otherwise, unchanged from my initial visit note.  Past Medical History:  Diagnosis Date  . Anemia   . Arthritis   . Chronic kidney disease   . Diabetes mellitus without complication ([place])   . Hypertension   . MRSA infection 2013   on leg; surgery   . Neuropathy due to secondary diabetes ([place])   . Thyroid disease    Past Surgical History:  Procedure Laterality  Date  . BILATERAL CARPAL TUNNEL RELEASE Bilateral 2003  . CATARACT EXTRACTION W/ INTRAOCULAR LENS IMPLANT Left 2016  . CHOLECYSTECTOMY    . 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 EYE SURGERY    . KNEE ARTHROSCOPY Left 05/05/2016   Procedure: ARTHROSCOPY KNEE;  Surgeon: Thornton Park, MD;  Location: [facility] ORS;  Service: Orthopedics;  Laterality: Left;  . SHOULDER ARTHROSCOPY Left 2004  . THYROID SURGERY  2004  . TRIGGER FINGER RELEASE  2003   History   Social History Main Topics  . Smoking status: Former Research scientist (life sciences)  . Smokeless tobacco: Not on file  . Alcohol Use: No  . Drug Use: No   Social History Narrative   Single   0 children   [place]travel[place]      Beer and wine on occasion   First menstrual  cycle: 6th grade   Postmenopausal      Current Outpatient Medications  Medication Sig Dispense Refill  . amLODipine (NORVASC) 10 MG tablet Take 10 mg by mouth daily. Reported on 11/06/2015    . hydrochlorothiazide (HYDRODIURIL) 25 MG tablet Take 25 mg by mouth daily.    . insulin NPH Human (HUMULIN N,NOVOLIN N) 100 UNIT/ML injection Inject 40 units in am and 30 units before dinner 20 mL 11  . insulin regular (NOVOLIN R RELION) 100 units/mL injection INJECT 10-35 UNITS SUB-Q 3 TIMES DAILY BEFORE MEALS. ReliOn insulin. 20 mL 11  . Insulin Syringe-Needle U-100 31G X 15/64" 0.5 ML MISC Use 3x a day 100 each 11  . levothyroxine (SYNTHROID, LEVOTHROID) 150 MCG tablet Take 1 tablet (150 mcg total) by mouth daily before breakfast. 90 tablet 3  . losartan (COZAAR) 100 MG tablet Take 100 mg by mouth daily.     Marland Kitchen lovastatin (MEVACOR) 20 MG tablet Take 20 mg by mouth at bedtime.     No current facility-administered medications for this visit.    NKDA   Family History  Problem Relation Age of Onset  . Hypertension Mother   . Thyroid disease Mother   . CVA Mother   . Hypertension Sister   . Thyroid disease Sister   . Hypertension Brother   . Hyperlipidemia Brother     PE: BP 130/70   Pulse 78   Ht 5' 7.5" (1.715 m) Comment: measured  Wt 273 lb (123.8 kg)   SpO2 98%   BMI 42.13 kg/m  Body mass index is 42.13 kg/m. Wt Readings from Last 3 Encounters:  06/25/18 273 lb (123.8 kg)  02/14/18 273 lb 9.6 oz (124.1 kg)  11/13/17 271 lb 9.6 oz (123.2 kg)   Constitutional: overweight, in NAD Eyes: PERRLA, EOMI, no exophthalmos ENT: moist mucous membranes, no masses palpated in neck, no cervical lymphadenopathy Cardiovascular: RRR, No MRG Respiratory: CTA B Gastrointestinal: abdomen soft, NT, ND, BS+ Musculoskeletal: no deformities, strength intact in all 4 Skin: moist, warm, no rashes Neurological: no tremor with outstretched hands, DTR normal in all 4  ASSESSMENT: 1. DM2, insulin-dependent, uncontrolled, with complications - CKD - DR  2. 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 She did not have RAI treatment at that time, however, she was found to have a thyroid mass in 2005. This was biopsied and returned as normal thyroid tissue.She had RAI treatment at that time. The posttreatment whole-body scan was negative for any cancer spread.   - Neck U/S (09/19/2014): 1. Post total thyroidectomy. 2. No change in the previously biopsied approximately 1.4 cm echogenic solid nodule within the inferior aspect of the right lobe of the thyroid, grossly unchanged since the 2005 examination. 3. Apparent development of an approximately 0.4 cm hypoechoic nodule within the right thyroidectomy bed - while too small for definitive characterization, this nodule appears to contain an echogenic hilum and thus is favored to represent a non pathologically enlarged cervical lymph node.  - Neck U/S (11/27/2015):  1. Surgical changes of prior total thyroidectomy. 2. Unchanged 1.4 cm echogenic soft tissue  nodule in the right thyroid resection bed. Continued stability over time consistent with a benign process.  3. Post surgical hypothyroidism  PLAN:  1. Patient with long-standing, uncontrolled, type 2 diabetes, on basal-bolus insulin regimen, with NPH and regular insulin.  Her  diabetes is controlled on the current regimen if she is controlling her diet, however, sugars are higher with dietary indiscretions in late dinners.  At last visit we increased her R insulin with dinner if she had a larger or later meal.  Otherwise, we continue the same doses.  I advised her to write down the reasons for high blood sugars whenever they happen. - at this visit, sugars are very variable, overall still high.  She is having hyperglycemia episodes especially when she misses insulin.  For example, she did not take an insulin the evening after having a blood sugar of 98 before dinner.  At that time she also took a low dose of regular insulin.  The next morning, sugars were in the mid 200s.  We discussed that this is a perfect example when she should not miss any of the insulins.  We did discuss when to decrease the doses of our insulin but she always has to take the and insulin.  Also, she may have high blood sugars after drinking sweet tea or regular sodas, which she drinks on a regular, daily basis.  We discussed about the absolute need to stop drinking these. -Otherwise, I would not change the doses of her regular and NPH insulin for now - I advised her to: Patient Instructions   Please use the following insulin regimen: Insulin Before breakfast Before lunch Before dinner  Regular 35 10 35-40  NPH 40  30  Please take only 25 units of R insulin if sugars before meals <80. If sugars < or = 60 before meals, skip R insulin with that meal. Do not skip N insulin.  NO REGULAR SODAS AND SWEET TEA.  DO NOT MISS INSULIN.   Please continue Levothyroxine 150 mcg daily.  Take the thyroid hormone every day, with water,  at least 30 minutes before breakfast, separated by at least 4 hours from: - acid reflux medications - calcium - iron - multivitamins  Please return in 4 months with your sugar log.   - today, HbA1c is 10.1% (higher) - continue checking sugars at different times of the day - check 3x a day, rotating checks - advised for yearly eye exams >> she is UTD - Return to clinic in 4 mo with sugar log       2. Thyroid cancer, in remission -Patient with subcentimeter follicular variant of papillary thyroid cancer, multifocal, likely completely cured after her thyroidectomy my especially since she also had RAI treatment a year later, in 2005.  Her latest thyroid ultrasound was in 11/2015 and it showed a stable mass since 2015, intrathyroidal, consistent with a benign process. -At last check, thyroglobulin was lower, but overall this has been fluctuating in the past while ATA were undetectable -We will recheck these today -No neck compression symptoms  3. Postsurgical hypothyroidism - latest thyroid labs reviewed with pt >> normal  - she continues on LT4 150 mcg daily - pt feels good on this dose. - we discussed about taking the thyroid hormone every day, with water, >30 minutes before breakfast, separated by >4 hours from acid reflux medications, calcium, iron, multivitamins. Pt. is taking it correctly. - will check thyroid tests today: TSH and fT4 - If labs are abnormal, she will need to return for repeat TFTs in 1.5 months  Component     Latest Ref Rng & Units 06/25/2018  Glucose     65 - 99 mg/dL 78  BUN     7 - 25 mg/dL  21  Creatinine     0.50 - 0.99 mg/dL 1.44 (H)  GFR, Est Non African American     > OR = 60 mL/min/1.71m 39 (L)  GFR, Est African American     > OR = 60 mL/min/1.781m45 (L)  BUN/Creatinine Ratio     6 - 22 (calc) 15  Sodium     135 - 146 mmol/L 141  Potassium     3.5 - 5.3 mmol/L 4.0  Chloride     98 - 110 mmol/L 107  CO2     20 - 32 mmol/L 27  Calcium      8.6 - 10.4 mg/dL 9.0  Total Protein     6.1 - 8.1 g/dL 6.9  Albumin MSPROF     3.6 - 5.1 g/dL 3.7  Globulin     1.9 - 3.7 g/dL (calc) 3.2  AG Ratio     1.0 - 2.5 (calc) 1.2  Total Bilirubin     0.2 - 1.2 mg/dL 0.4  Alkaline phosphatase (APISO)     33 - 130 U/L 102  AST     10 - 35 U/L 22  ALT     6 - 29 U/L 24  Cholesterol     0 - 200 mg/dL 134  Triglycerides     0.0 - 149.0 mg/dL 47.0  HDL Cholesterol     >39.00 mg/dL 70.20  VLDL     0.0 - 40.0 mg/dL 9.4  LDL (calc)     0 - 99 mg/dL 54  Total CHOL/HDL Ratio      2  NonHDL      63.73  Thyroglobulin     ng/mL 2.5 (L)  Comment        Thyroglobulin Ab     < or = 1 IU/mL <1  TSH     0.35 - 4.50 uIU/mL 1.39  T4,Free(Direct)     0.60 - 1.60 ng/dL 0.99   TFTs are normal but thyroglobulin is still detectable and slightly higher than before.  However, this has been fluctuating in the past. Kidney function is low, not much change from before.  CrPhilemon KingdomMD PhD [facility]

## 2018-06-26 LAB — COMPLETE METABOLIC PANEL WITH GFR
AG RATIO: 1.2 (calc) (ref 1.0–2.5)
ALT: 24 U/L (ref 6–29)
AST: 22 U/L (ref 10–35)
Albumin: 3.7 g/dL (ref 3.6–5.1)
Alkaline phosphatase (APISO): 102 U/L (ref 33–130)
BUN/Creatinine Ratio: 15 (calc) (ref 6–22)
BUN: 21 mg/dL (ref 7–25)
CO2: 27 mmol/L (ref 20–32)
Calcium: 9 mg/dL (ref 8.6–10.4)
Chloride: 107 mmol/L (ref 98–110)
Creat: 1.44 mg/dL — ABNORMAL HIGH (ref 0.50–0.99)
GFR, EST NON AFRICAN AMERICAN: 39 mL/min/{1.73_m2} — AB (ref >=60)
GFR, Est African American: 45 mL/min/{1.73_m2} — ABNORMAL LOW (ref >=60)
GLOBULIN: 3.2 g/dL (ref 1.9–3.7)
Glucose, Bld: 78 mg/dL (ref 65–99)
POTASSIUM: 4 mmol/L (ref 3.5–5.3)
Sodium: 141 mmol/L (ref 135–146)
TOTAL PROTEIN: 6.9 g/dL (ref 6.1–8.1)
Total Bilirubin: 0.4 mg/dL (ref 0.2–1.2)

## 2018-06-26 LAB — THYROGLOBULIN LEVEL: Thyroglobulin: 2.5 ng/mL — ABNORMAL LOW

## 2018-06-26 LAB — THYROGLOBULIN ANTIBODY: Thyroglobulin Ab: <1 IU/mL (ref <=1)

## 2018-06-28 ENCOUNTER — Encounter: Payer: Self-pay | Admitting: Obstetrics and Gynecology

## 2018-06-28 ENCOUNTER — Ambulatory Visit (INDEPENDENT_AMBULATORY_CARE_PROVIDER_SITE_OTHER): Payer: Self-pay | Admitting: Obstetrics and Gynecology

## 2018-06-28 ENCOUNTER — Telehealth: Payer: Self-pay

## 2018-06-28 ENCOUNTER — Other Ambulatory Visit (HOSPITAL_COMMUNITY)
Admission: RE | Admit: 2018-06-28 | Discharge: 2018-06-28 | Disposition: A | Discharge status: Home / Self Care | Payer: No Typology Code available for payment source | Source: Ambulatory Visit | Attending: Obstetrics and Gynecology | Admitting: Obstetrics and Gynecology

## 2018-06-28 DIAGNOSIS — N950 Postmenopausal bleeding: Secondary | ICD-10-CM | POA: Insufficient documentation

## 2018-06-28 DIAGNOSIS — R87619 Unspecified abnormal cytological findings in specimens from cervix uteri: Secondary | ICD-10-CM

## 2018-06-28 LAB — POCT PREGNANCY, URINE: Preg Test, Ur: NEGATIVE

## 2018-06-28 NOTE — Patient Instructions (Signed)
Postmenopausal Bleeding Postmenopausal bleeding is any bleeding after menopause. Menopause is when a woman's period stops. Any type of bleeding after menopause is concerning. It should be checked by your doctor. Any treatment will depend on the cause. Follow these instructions at home: Watch your condition for any changes.  Avoid the use of tampons and douches as told by your doctor.  Change your pads often.  Get regular pelvic exams and Pap tests.  Keep all appointments for tests as told by your doctor.  Contact a doctor if:  Your bleeding lasts for more than 1 week.  You have belly (abdominal) pain.  You have bleeding after sex (intercourse). Get help right away if:  You have a fever, chills, a headache, dizziness, muscle aches, and bleeding.  You have strong pain with bleeding.  You have clumps of blood (blood clots) coming from your vagina.  You have bleeding and need more than 1 pad an hour.  You feel like you are going to pass out (faint). This information is not intended to replace advice given to you by your health care provider. Make sure you discuss any questions you have with your health care provider. Document Released: 06/07/2008 Document Revised: 02/04/2016 Document Reviewed: 03/28/2013 Elsevier Interactive Patient Education  2017 [place]. Endometrial Biopsy, Care After This sheet gives you information about how to care for yourself after your procedure. Your health care provider may also give you more specific instructions. If you have problems or questions, contact your health care provider. What can I expect after the procedure? After the procedure, it is common to have:  Mild cramping.  A small amount of vaginal bleeding for a few days. This is normal.  Follow these instructions at home:  Take over-the-counter and prescription medicines only as told by your health care provider.  Do not douche, use tampons, or have sexual intercourse until your  health care provider approves.  Return to your normal activities as told by your health care provider. Ask your health care provider what activities are safe for you.  Follow instructions from your health care provider about any activity restrictions, such as restrictions on strenuous exercise or heavy lifting. Contact a health care provider if:  You have heavy bleeding, or bleed for longer than 2 days after the procedure.  You have bad smelling discharge from your vagina.  You have a fever or chills.  You have a burning sensation when urinating or you have difficulty urinating.  You have severe pain in your lower abdomen. Get help right away if:  You have severe cramps in your stomach or back.  You pass large blood clots.  Your bleeding increases.  You become weak or light-headed, or you pass out. Summary  After the procedure, it is common to have mild cramping and a small amount of vaginal bleeding for a few days.  Do not douche, use tampons, or have sexual intercourse until your health care provider approves.  Return to your normal activities as told by your health care provider. Ask your health care provider what activities are safe for you. This information is not intended to replace advice given to you by your health care provider. Make sure you discuss any questions you have with your health care provider. Document Released: 06/19/2013 Document Revised: 09/14/2016 Document Reviewed: 09/14/2016 Elsevier Interactive Patient Education  2017 [employer].

## 2018-06-28 NOTE — Telephone Encounter (Signed)
Called pt to give new time for [place] on 07/04/18 @ 3:45. Arrive at 3:30 w/ full bladder, no answer, left VM.

## 2018-06-28 NOTE — Progress Notes (Signed)
Ms. Jupin presents for eval of PMB and the abnormal finding of endometrial cells on pap smear. Pt has pap smear with [facility] program in September. Had been 5-10 yrs since her last pap smear  Pt reports menopause @ age 63 in 2012. No bleeding until 2017. Since that time she has had some degree of bleeding daily to random. She last bleed end of July/August.  She is sexual active occasionally. No problems.  Nulligravid  PMH and PSH as documented  Meds as listed  PE AF  VSS Lungs clear  Heart RRR Abd soft + BS  ENDOMETRIAL BIOPSY     The indications for endometrial biopsy were reviewed.   Risks of the biopsy including cramping, bleeding, infection, uterine perforation, inadequate specimen and need for additional procedures  were discussed. The patient states she understands and agrees to undergo procedure today. Consent was signed. Time out was performed. Urine HCG was negative. During the pelvic exam, the cervix was prepped with Betadine. A single-toothed tenaculum was placed on the anterior lip of the cervix to stabilize it. The 3 mm pipelle was introduced into the endometrial cavity without difficulty to a depth of 8cm, and a moderate amount of tissue was obtained and sent to pathology. The instruments were removed from the patient's vagina. Minimal bleeding from the cervix was noted. The patient tolerated the procedure well. Routine post-procedure instructions were given to the patient.       GYNECOLOGY CLINIC COLPOSCOPY PROCEDURE NOTE  61 y.o. No obstetric history on file. here for colposcopy for endometerial cells pap smear on 9/19.  Patient given informed consent, signed copy in the chart, time out was performed.  Placed in lithotomy position. Cervix viewed with speculum and colposcope after application of acetic acid.   Colposcopy adequate? Yes  no visible lesions; No biopsies obtained.  ECC specimen obtained. All specimens were labelled and sent to pathology.   Patient was  given post procedure instructions.  Will follow up pathology and manage accordingly.  Routine preventative health maintenance measures emphasized.   A/P PMB        Abnormal pap smear, endometrial cells   Above DX reviewed with pt. Indications for above testing reviewed with pt. Above procedures completed without problems. Will also check GYN U/S. F/U as per test results.

## 2018-07-03 ENCOUNTER — Ambulatory Visit (HOSPITAL_COMMUNITY): Payer: Self-pay

## 2018-07-03 ENCOUNTER — Encounter: Payer: Self-pay | Admitting: *Deleted

## 2018-07-04 ENCOUNTER — Ambulatory Visit (HOSPITAL_COMMUNITY)
Admission: RE | Admit: 2018-07-04 | Discharge: 2018-07-04 | Disposition: A | Discharge status: Home / Self Care | Payer: No Typology Code available for payment source | Source: Ambulatory Visit | Attending: Obstetrics and Gynecology | Admitting: Obstetrics and Gynecology

## 2018-07-04 ENCOUNTER — Ambulatory Visit (INDEPENDENT_AMBULATORY_CARE_PROVIDER_SITE_OTHER): Payer: Self-pay | Admitting: *Deleted

## 2018-07-04 DIAGNOSIS — N950 Postmenopausal bleeding: Secondary | ICD-10-CM | POA: Insufficient documentation

## 2018-07-04 DIAGNOSIS — R9389 Abnormal findings on diagnostic imaging of other specified body structures: Secondary | ICD-10-CM | POA: Insufficient documentation

## 2018-07-04 DIAGNOSIS — D252 Subserosal leiomyoma of uterus: Secondary | ICD-10-CM | POA: Insufficient documentation

## 2018-07-04 NOTE — Progress Notes (Signed)
Here for results of her endometrial biopsy and colposcopy. I gave her results of her endometrial biopsy ( negative) and ecc ( negative).  She states she has been having light bleeding since 06/28/18 .  She has [place] today. I explained Dr. Rip Harbour will decide on her treatment once he has reviewed all her results and that I would send him a message. She will get a call if he wants her to come in for another visit or with instructions. If bleeding continues and she does not get a call; to call us. She voices understanding.

## 2018-07-09 ENCOUNTER — Telehealth: Payer: Self-pay | Admitting: General Practice

## 2018-07-09 NOTE — Telephone Encounter (Signed)
-----   Message from Chancy Milroy, MD sent at 07/09/2018  1:08 PM EDT ----- Please let pt know that I have reviewed her EMBX and U/S results. Based on my review of the findings I recommend pt have an Hysteroscopy D & C for further evaluation of her PMB. Thanks Legrand Como

## 2018-07-09 NOTE — Telephone Encounter (Signed)
Called patient & informed her of recommendations. Patient verbalized understanding and states she doesn't have insurance. Recommended she come by the office and pick up a financial application to complete & discussed follow up visit in the office. Patient verbalized understanding & had no questions.

## 2018-08-23 ENCOUNTER — Encounter: Payer: Self-pay | Admitting: Obstetrics and Gynecology

## 2018-08-23 ENCOUNTER — Ambulatory Visit (INDEPENDENT_AMBULATORY_CARE_PROVIDER_SITE_OTHER): Payer: Self-pay | Admitting: Obstetrics and Gynecology

## 2018-08-23 VITALS — BP 126/68 | HR 79 | Ht 67.5 in | Wt 271.9 lb

## 2018-08-23 DIAGNOSIS — N950 Postmenopausal bleeding: Secondary | ICD-10-CM

## 2018-08-23 DIAGNOSIS — R87619 Unspecified abnormal cytological findings in specimens from cervix uteri: Secondary | ICD-10-CM

## 2018-08-23 NOTE — Progress Notes (Signed)
Ms Mucha presents to discuss EMBX and U/S results  EMBX were normal but scant amount of tissue  U/S demonstrated uterine fibroids and thicken endometrium with internal cystic foci  She had some bleeding after [facility] but this has stopped  Last episode of PMB was in July/August  Results reviewed with pt  Hysteroscopy D & C recommended to pt  PE AF VSS Lungs clear Heart RRR  A/P PMB  Pt to complete financial aid package and then will schedule procedure Procedure reviewed with pt including R/B/Post op care F/U with post op appt

## 2018-08-23 NOTE — Patient Instructions (Signed)
Hysteroscopy  Hysteroscopy is a procedure used for looking inside the womb (uterus). It may be done for various reasons, including:  · To evaluate abnormal bleeding, fibroid (benign, noncancerous) tumors, polyps, scar tissue (adhesions), and possibly cancer of the uterus.  · To look for lumps (tumors) and other uterine growths.  · To look for causes of why a woman cannot get pregnant (infertility), causes of recurrent loss of pregnancy (miscarriages), or a lost intrauterine device (IUD).  · To perform a sterilization by blocking the fallopian tubes from inside the uterus.    In this procedure, a thin, flexible tube with a tiny light and camera on the end of it (hysteroscope) is used to look inside the uterus. A hysteroscopy should be done right after a menstrual period to be sure you are not pregnant.  LET YOUR HEALTH CARE PROVIDER KNOW ABOUT:  · Any allergies you have.  · All medicines you are taking, including vitamins, herbs, eye drops, creams, and over-the-counter medicines.  · Previous problems you or members of your family have had with the use of anesthetics.  · Any blood disorders you have.  · Previous surgeries you have had.  · Medical conditions you have.  RISKS AND COMPLICATIONS  Generally, this is a safe procedure. However, as with any procedure, complications can occur. Possible complications include:  · Putting a hole in the uterus.  · Excessive bleeding.  · Infection.  · Damage to the cervix.  · Injury to other organs.  · Allergic reaction to medicines.  · Too much fluid used in the uterus for the procedure.    BEFORE THE PROCEDURE  · Ask your health care provider about changing or stopping any regular medicines.  · Do not take aspirin or blood thinners for 1 week before the procedure, or as directed by your health care provider. These can cause bleeding.  · If you smoke, do not smoke for 2 weeks before the procedure.  · In some cases, a medicine is placed in the cervix the day before the procedure.  This medicine makes the cervix have a larger opening (dilate). This makes it easier for the instrument to be inserted into the uterus during the procedure.  · Do not eat or drink anything for at least 8 hours before the surgery.  · Arrange for someone to take you home after the procedure.  PROCEDURE  · You may be given a medicine to relax you (sedative). You may also be given one of the following:  ? A medicine that numbs the area around the cervix (local anesthetic).  ? A medicine that makes you sleep through the procedure (general anesthetic).  · The hysteroscope is inserted through the vagina into the uterus. The camera on the hysteroscope sends a picture to a TV screen. This gives the surgeon a good view inside the uterus.  · During the procedure, air or a liquid is put into the uterus, which allows the surgeon to see better.  · Sometimes, tissue is gently scraped from inside the uterus. These tissue samples are sent to a lab for testing.  What to expect after the procedure  · If you had a general anesthetic, you may be groggy for a couple hours after the procedure.  · If you had a local anesthetic, you will be able to go home as soon as you are stable and feel ready.  · You may have some cramping. This normally lasts for a couple days.  · You may   have bleeding, which varies from light spotting for a few days to menstrual-like bleeding for 3-7 days. This is normal.  · If your test results are not back during the visit, make an appointment with your health care provider to find out the results.  This information is not intended to replace advice given to you by your health care provider. Make sure you discuss any questions you have with your health care provider.  Document Released: 12/05/2000 Document Revised: 02/04/2016 Document Reviewed: 03/28/2013  Elsevier Interactive Patient Education © 2017 [employer]

## 2018-09-12 DIAGNOSIS — Z8616 Personal history of COVID-19: Secondary | ICD-10-CM

## 2018-09-12 HISTORY — DX: Personal history of COVID-19: Z86.16

## 2018-10-17 DIAGNOSIS — E113593 Type 2 diabetes mellitus with proliferative diabetic retinopathy without macular edema, bilateral: Secondary | ICD-10-CM | POA: Diagnosis not present

## 2018-10-23 ENCOUNTER — Ambulatory Visit (INDEPENDENT_AMBULATORY_CARE_PROVIDER_SITE_OTHER): Payer: BLUE CROSS/BLUE SHIELD | Admitting: Internal Medicine

## 2018-10-23 ENCOUNTER — Encounter: Payer: Self-pay | Admitting: Internal Medicine

## 2018-10-23 VITALS — BP 140/60 | HR 88 | Ht 67.5 in | Wt 267.0 lb

## 2018-10-23 DIAGNOSIS — E890 Postprocedural hypothyroidism: Secondary | ICD-10-CM | POA: Diagnosis not present

## 2018-10-23 DIAGNOSIS — C73 Malignant neoplasm of thyroid gland: Secondary | ICD-10-CM

## 2018-10-23 DIAGNOSIS — E1165 Type 2 diabetes mellitus with hyperglycemia: Secondary | ICD-10-CM | POA: Diagnosis not present

## 2018-10-23 DIAGNOSIS — IMO0002 Reserved for concepts with insufficient information to code with codable children: Secondary | ICD-10-CM

## 2018-10-23 DIAGNOSIS — E1121 Type 2 diabetes mellitus with diabetic nephropathy: Secondary | ICD-10-CM | POA: Diagnosis not present

## 2018-10-23 LAB — POCT GLYCOSYLATED HEMOGLOBIN (HGB A1C): Hemoglobin A1C: 13.7 % — AB (ref 4.0–5.6)

## 2018-10-23 MED ORDER — "INSULIN SYRINGE-NEEDLE U-100 31G X 15/64"" 0.5 ML MISC": 11 refills | Status: DC

## 2018-10-23 MED ORDER — LEVOTHYROXINE SODIUM 150 MCG PO TABS: 150.0000 ug | ORAL_TABLET | Freq: Every day | ORAL | 3 refills | Status: DC

## 2018-10-23 NOTE — Patient Instructions (Addendum)
Please increase: Insulin Before breakfast Before lunch Before dinner  Regular 40 10 units if you eat 40  NPH 40  30  Please take only 25 units of R insulin if sugars before meals <80. If sugars < or = 60 before meals, skip R insulin with that meal.  Stop regular sodas and sweet tea.  Try not to miss insulin doses.   Please continue Levothyroxine 150 mcg daily.  Take the thyroid hormone every day, with water, at least 30 minutes before breakfast, separated by at least 4 hours from: - acid reflux medications - calcium - iron - multivitamins  Please return in 3 months with your sugar log.

## 2018-10-23 NOTE — Addendum Note (Signed)
Addended by: Cardell Peach I on: 10/23/2018 03:56 PM   Modules accepted: Orders

## 2018-10-23 NOTE — Progress Notes (Signed)
Patient ID: Courtney [name], female   DOB: 1956/02/28, 63 y.o.   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 Last visit 4 months ago.  She had the flu since last visit.  Also knee pain.  Sugars are higher.  DM2: Last hemoglobin A1c was: Lab Results  Component Value Date   HGBA1C 10.1 (A) 06/25/2018   HGBA1C 8.6 (A) 02/14/2018   HGBA1C 8.3 11/13/2017  08/22/2016: HbA1c 11.8% 08/01/2014: HbA1c 14.5% 05/02/2014: HbA1c 14.8% 01/31/2014: HbA1c 13.6% 11/01/2013: HbA1c 15.1%  She was on: - Tresiba U200 70 units in am - ReliOn Novolin to 2-3x a day: - before breakfast and dinner:  20 units with a regular meal  25 units before a larger meal  (15 units before lunch if you eat lunch) - not using Stopped Metformin 500 mg 2x a day - b/c leg swelling. She was on Novolin 70/30 48 units 2x a day 15 min before the meals  She was on Amaryl 8 mg in am She was on Metformin >> stopped 2/2 CKD.  She is currently on: (different doses than recommended) Insulin Before breakfast Before lunch Before dinner  Regular 35  (not taking b/c she is not eating) 35  NPH 30  35  Please take only 25 units of R insulin if sugars before meals <80. If sugars < or = 60 before meals, skip R insulin with that meal.  She checks sugars 1-2 times a day (no log, no meter) - am: 68, 80-162, 202 , 227, 260 >> 103-250 (missed NPH the night before) >> >> 118-135 - 2h after b'fast: n/c - before lunch: 75, 113, 165 >> 114, 192, 229 >> n/c - 2h after lunch: n/c >> 67, 228 >> n/c - before dinner:   75-192, 259 >> 98, 115, 157, 251, 383 (sweet drinks) >> 175-245 - 2h after dinner: n/c >> 131, 269 >> n/c - bedtime:81-160 >> n/c >> 240s >> n/c  - nighttime: n/c  Lowest sugar was 103 >> 118; it is unclear at  which level she has hypoglycemia awareness. Highest sugar was 383 >> 245.  Glucometer: Prodigy >> ReliOn  Pt's meals are: - Breakfast: chicken  - Lunch: peanuts, sandwich + crystal lite drink - Dinner: frozen dinner - Snacks: fruit, small bag potato chips, peanuts Still drinking sweet tea.  -+ Stage III CKD, BUN/creatinine:  Lab Results  Component Value Date   BUN 21 06/25/2018   Lab Results  Component Value Date   CREATININE 1.44 (H) 06/25/2018  08/22/2016: BUN/creatinine 20/1.41 03/18/2016: BUN/creatinine 16/1.21, EGFR 57, Calcium 8.7, AST/ALT 17/24 09/18/2015: BUN/creatinine 14/1.2, GFR 57 (previously 55) 08/01/2014: 15/1.26, GFR 55 On Cozaar.  -+ HL; lipids: Lab Results  Component Value Date   CHOL 134 06/25/2018   HDL 70.20 06/25/2018   LDLCALC 54 06/25/2018   TRIG 47.0 06/25/2018   CHOLHDL 2 06/25/2018  03/18/2016: Lipids: 156/85/70/69 09/18/2015: 160/57/87/62 08/01/2014: 145/78/74/55 On lovastatin 20.  - last eye exam was in 01/2018: + DR; she had laser surgeries and intraocular injections.  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  - She did not have RAI treatment at that time, however, she was found to have a thyroid mass in 2005 >> This was biopsied and returned as normal thyroid tissue, but had RAI treatment at that time.  - The posttreatment whole-body scan was negative for any cancer spread.   Thyroglobulin returned at 2.3 in 08/2014 >> I ordered a neck ultrasound that showed a stable nodule with a fatty hilum, consistent with a benign lymph node, but no other masses in the surgical bed.   The thyroglobulin level remained detectable, and was actually 9.9 in  10/2015.   A neck ultrasound (11/2015) showed a stable 1.4 nodule in the thyroid bed, consistent with a benign process   Thyroglobulin continues to be detectable.  Reviewed pertinent labs: Lab Results  Component Value Date   THYROGLB 2.5 (L) 06/25/2018   THYROGLB 1.8 (L) 08/15/2017   THYROGLB 2.5 (L) 08/29/2016   THYROGLB 9.9 11/06/2015   THYROGLB 2.4 (L) 09/18/2015   THYROGLB 0.8 (L) 12/26/2014   THYROGLB 2.3 (L) 08/22/2014   THGAB <1 06/25/2018   THGAB <1 08/15/2017   THGAB <1 08/29/2016   THGAB <1 09/18/2015   THGAB <1.0 12/26/2014   THGAB <1 08/22/2014   THGAB <1.0 08/22/2014   Postsurgical hypothyroidism:  Reviewed patient's TFTs: Lab Results  Component Value Date   TSH 1.39 06/25/2018   TSH 0.61 08/15/2017   TSH 1.82 08/29/2016   TSH 1.03 09/18/2015   TSH 2.72 12/26/2014   FREET4 0.99 06/25/2018   FREET4 1.04 08/15/2017   FREET4 1.30 08/29/2016   FREET4 1.13 09/18/2015   FREET4 1.00 12/26/2014  08/01/2014: TSH 1.89  Pt is on levothyroxine 150 mcg daily, taken: - in am - fasting - at least 30 min from b'fast - no Ca, Fe, MVI, PPIs - + on Biotin - on B complex  No FH of thyroid cancer. No h/o radiation tx to head or neck.  No herbal supplements. No Biotin use. No recent steroids use.   ROS: Constitutional: no weight gain/no weight loss, no fatigue, no subjective hyperthermia, no subjective hypothermia Eyes: no blurry vision, no xerophthalmia ENT: no sore throat, + see HPI Cardiovascular: no CP/no SOB/no palpitations/no leg swelling Respiratory: + cough/no SOB/no wheezing Gastrointestinal: no N/no V/no D/no C/no acid reflux Musculoskeletal: no muscle aches/+ joint aches Skin: no rashes, no hair loss Neurological: no tremors/+ numbness/+ tingling/no dizziness  I reviewed pt's medications, allergies, PMH, social hx, family hx, and changes were documented in the history of present illness. Otherwise, unchanged from my initial visit note.  Past Medical  History:  Diagnosis Date  . Anemia   . Arthritis   . Chronic kidney disease   . Diabetes mellitus without complication ([place])   . Hypertension   . MRSA infection 2013   on leg; surgery   . Neuropathy due to secondary diabetes ([place])   . Thyroid disease    Past Surgical History:  Procedure Laterality Date  . BILATERAL CARPAL TUNNEL RELEASE Bilateral 2003  . CATARACT EXTRACTION W/ INTRAOCULAR LENS IMPLANT Left 2016  . CHOLECYSTECTOMY    . 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 EYE SURGERY    . KNEE ARTHROSCOPY Left 05/05/2016   Procedure: ARTHROSCOPY KNEE;  Surgeon: Thornton Park, MD;  Location: [facility] ORS;  Service: Orthopedics;  Laterality: Left;  . SHOULDER ARTHROSCOPY Left 2004  . THYROID SURGERY  2004  . TRIGGER FINGER RELEASE  2003   History  Social History Main Topics  . Smoking status: Former Research scientist (life sciences)  . Smokeless tobacco: Not on file  . Alcohol Use: No  . Drug Use: No   Social History Narrative   Single   0 children   [place]travel[place]      Beer and wine on occasion   First menstrual cycle: 6th grade   Postmenopausal      Current Outpatient Medications  Medication Sig Dispense Refill  . amLODipine (NORVASC) 10 MG tablet Take 10 mg by mouth daily. Reported on 11/06/2015    . hydrochlorothiazide (HYDRODIURIL) 25 MG tablet Take 25 mg by mouth daily.    . insulin NPH Human (HUMULIN N,NOVOLIN N) 100 UNIT/ML injection Inject 40 units in am and 30 units before dinner (Patient not taking: Reported on 08/23/2018) 20 mL 11  . insulin regular (NOVOLIN R RELION) 100 units/mL injection INJECT 10-35 UNITS SUB-Q 3 TIMES DAILY BEFORE MEALS. ReliOn insulin. 20 mL 11  . Insulin Syringe-Needle U-100 31G X 15/64" 0.5 ML MISC Use 3x a day 100 each 11  . levothyroxine (SYNTHROID, LEVOTHROID) 150 MCG tablet Take 1 tablet (150 mcg total) by mouth daily before breakfast. 90 tablet 3  . losartan (COZAAR) 100 MG tablet Take 100 mg by mouth daily.     Marland Kitchen lovastatin (MEVACOR)  20 MG tablet Take 20 mg by mouth at bedtime.     No current facility-administered medications for this visit.    NKDA   Family History  Problem Relation Age of Onset  . Hypertension Mother   . Thyroid disease Mother   . CVA Mother   . Hypertension Sister   . Thyroid disease Sister   . Hypertension Brother   . Hyperlipidemia Brother    PE: BP 140/60   Pulse 88   Ht 5' 7.5" (1.715 m)   Wt 267 lb (121.1 kg)   SpO2 98%   BMI 41.20 kg/m  Body mass index is 41.2 kg/m. Wt Readings from Last 3 Encounters:  10/23/18 267 lb (121.1 kg)  08/23/18 271 lb 14.4 oz (123.3 kg)  06/28/18 271 lb 9.6 oz (123.2 kg)   Constitutional: overweight, in NAD Eyes: PERRLA, EOMI, no exophthalmos ENT: moist mucous membranes, no palpable masses in the neck, no cervical lymphadenopathy Cardiovascular: RRR, No MRG Respiratory: CTA B Gastrointestinal: abdomen soft, NT, ND, BS+ Musculoskeletal: no deformities, strength intact in all 4 Skin: moist, warm, no rashes Neurological: no tremor with outstretched hands, DTR normal in all 4  ASSESSMENT: 1. DM2, insulin-dependent, uncontrolled, with complications - CKD - DR  2. 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 She did not have RAI treatment at that time, however, she was found to have a thyroid mass in 2005. This was biopsied and returned as normal thyroid tissue.She had RAI treatment at that time. The posttreatment whole-body scan was negative for any cancer spread.   - Neck U/S (09/19/2014): 1. Post total thyroidectomy. 2. No change in the previously biopsied approximately 1.4 cm echogenic solid nodule within the inferior aspect of the right lobe of the thyroid, grossly unchanged since the 2005 examination. 3. Apparent development of an approximately 0.4 cm hypoechoic nodule within  the right thyroidectomy bed - while too small for definitive characterization, this nodule appears to contain an echogenic hilum and thus is favored to represent a non pathologically enlarged cervical  lymph node.  - Neck U/S (11/27/2015):  1. Surgical changes of prior total thyroidectomy. 2. Unchanged 1.4 cm echogenic soft tissue nodule in the right thyroid resection bed. Continued stability over time consistent with a benign process.  3. Post surgical hypothyroidism  PLAN:  1. Patient with longstanding, uncontrolled, type 2 diabetes, on basal-bolus insulin regimen, with NPH and regular insulin.  At last visit, sugars were high (HbA1c also increased, 10.1%) with dietary indiscretions and late dinners.  Also, she was drinking sodas and sweet tea, which I strongly advised her to stop.  Also, she was missing insulin doses and sugars were high after the respective meals.  We discussed about strategies to not miss doses.  I advised her to also write down the reasons for high blood sugars whenever they happen.  Otherwise, we did not change her insulin doses then. -At this visit, she does not have her log with her or her meter.  She also has not taken the doses that I recommended at last visit, but lower ones. - today, HbA1c is 13.7% (much higher) - Moreover, she is still drinking sweet tea, for example, this morning she drank 2 glasses.  I again strongly recommended to, the empty calories of sweet drinks especially now that her sugars are still high.  Per her recall, the sugars are not as high as expected from the HbA1c, but I would like to see her meter or log to better understand him.  For now, will increase almost all of her insulin doses except for evening NPH and strongly advised her to try to adjust her diet. - I advised her to: Patient Instructions   Please increase: Insulin Before breakfast Before lunch Before dinner  Regular 40 10 units if you eat 40  NPH 40  30  Please take only 25 units  of R insulin if sugars before meals <80. If sugars < or = 60 before meals, skip R insulin with that meal.  Stop regular sodas and sweet tea.  Try not to miss insulin doses.   Please continue Levothyroxine 150 mcg daily.  Take the thyroid hormone every day, with water, at least 30 minutes before breakfast, separated by at least 4 hours from: - acid reflux medications - calcium - iron - multivitamins  Please return in 3 months with your sugar log.     - continue checking sugars at different times of the day - check 3x a day, rotating checks - advised for yearly eye exams >> she is UTD - Return to clinic in 4 mo with sugar log        2. Thyroid cancer, in remission -Patient with subcentimeter oligo variant of papillary thyroid cancer, multifocal, status post RAI treatment in 2005.  Latest thyroid ultrasound was 11/2015 and it showed a stable mass since 2015, intrathyroidal, consistent with a benign process. -No neck compression symptoms -Her thyroglobulin levels are fluctuating but they were detectable including at last visit.  ATA's are undetectable. -We will recheck ATA + thyroglobulin at next visit >> We will check another thyroid ultrasound if the thyroglobulin increases  3. Postsurgical hypothyroidism - latest thyroid labs reviewed with pt >> normal 06/2018 - she continues on LT4 150 mcg daily - pt feels good on this dose. - we discussed about taking the thyroid hormone every day, with water, >30 minutes before breakfast, separated by >4 hours from acid reflux medications, calcium, iron, multivitamins. Pt. is taking it correctly.  [name], MD PhD [facility] Endocrinology

## 2018-10-25 DIAGNOSIS — I10 Essential (primary) hypertension: Secondary | ICD-10-CM | POA: Diagnosis not present

## 2018-10-25 DIAGNOSIS — E1165 Type 2 diabetes mellitus with hyperglycemia: Secondary | ICD-10-CM | POA: Diagnosis not present

## 2018-10-25 DIAGNOSIS — E785 Hyperlipidemia, unspecified: Secondary | ICD-10-CM | POA: Diagnosis not present

## 2018-10-25 DIAGNOSIS — N183 Chronic kidney disease, stage 3 (moderate): Secondary | ICD-10-CM | POA: Diagnosis not present

## 2018-11-27 DIAGNOSIS — Z6841 Body Mass Index (BMI) 40.0 and over, adult: Secondary | ICD-10-CM | POA: Diagnosis not present

## 2018-11-27 DIAGNOSIS — J019 Acute sinusitis, unspecified: Secondary | ICD-10-CM | POA: Diagnosis not present

## 2019-01-18 DIAGNOSIS — E113511 Type 2 diabetes mellitus with proliferative diabetic retinopathy with macular edema, right eye: Secondary | ICD-10-CM | POA: Diagnosis not present

## 2019-01-29 ENCOUNTER — Ambulatory Visit (INDEPENDENT_AMBULATORY_CARE_PROVIDER_SITE_OTHER): Payer: BLUE CROSS/BLUE SHIELD | Admitting: Internal Medicine

## 2019-01-29 ENCOUNTER — Encounter: Payer: Self-pay | Admitting: Internal Medicine

## 2019-01-29 ENCOUNTER — Other Ambulatory Visit: Payer: Self-pay

## 2019-01-29 DIAGNOSIS — E1165 Type 2 diabetes mellitus with hyperglycemia: Secondary | ICD-10-CM

## 2019-01-29 DIAGNOSIS — Z794 Long term (current) use of insulin: Secondary | ICD-10-CM

## 2019-01-29 DIAGNOSIS — E1121 Type 2 diabetes mellitus with diabetic nephropathy: Secondary | ICD-10-CM

## 2019-01-29 DIAGNOSIS — C73 Malignant neoplasm of thyroid gland: Secondary | ICD-10-CM

## 2019-01-29 DIAGNOSIS — E890 Postprocedural hypothyroidism: Secondary | ICD-10-CM

## 2019-01-29 DIAGNOSIS — IMO0002 Reserved for concepts with insufficient information to code with codable children: Secondary | ICD-10-CM

## 2019-01-29 NOTE — Patient Instructions (Signed)
Please continue: Insulin Before breakfast Before lunch Before dinner  Regular 40 10 (if you eat) 40  NPH 40  30  Please take only 25 units of R insulin if sugars before meals <80. If sugars < or = 60 before meals, skip R insulin with that meal.  Stop regular sodas and sweet tea.  Please continue Levothyroxine 150 mcg daily.  Take the thyroid hormone every day, with water, at least 30 minutes before breakfast, separated by at least 4 hours from: - acid reflux medications - calcium - iron - multivitamins  Please return in 3 months with your sugar log.

## 2019-01-29 NOTE — Progress Notes (Signed)
Patient ID: Courtney [name], female   DOB: Dec 31, 1955, 63 y.o.   MRN: [id number]  Patient location: Home My location: Office  Referring Provider: Cyndi Bender, PA-C  I connected with the patient on 01/29/19 at 10:22 AM EDT by telephone and verified that I am speaking with the correct person.   I discussed the limitations of evaluation and management by telephone and the availability of in person appointments. The patient expressed understanding and agreed to proceed.   Details of the encounter are shown below.  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 Last visit 3 months ago.  She had another URI >> cough syrup >> sugars higher.  DM2: Reviewed HbA1c levels: Lab Results  Component Value Date   HGBA1C 13.7 (A) 10/23/2018   HGBA1C 10.1 (A) 06/25/2018   HGBA1C 8.6 (A) 02/14/2018  08/22/2016: HbA1c 11.8% 08/01/2014: HbA1c 14.5% 05/02/2014: HbA1c 14.8% 01/31/2014: HbA1c 13.6% 11/01/2013: HbA1c 15.1%  She was on: - Tresiba U200 70 units in am - ReliOn Novolin to 2-3x a day: - before breakfast and dinner:  20 units with a regular meal  25 units before a larger meal  (15 units before lunch if you eat lunch) - not using Stopped Metformin 500 mg 2x a day - b/c leg swelling. She was on Novolin 70/30 48 units 2x a day 15 min before the meals  She was on Amaryl 8 mg in am She was on Metformin >> stopped 2/2 CKD.  She is currently on:   Insulin Before breakfast Before lunch Before dinner  Regular 40 10 (if you eat) 40  NPH 40  30  Please take only 25 units of R insulin if sugars before meals <80. If sugars < or = 60 before meals, skip R insulin with that meal.  She checks sugars 1-2 times a day: - am: 103-250 >> 118-135 >> 95-135, 175 - 2h after b'fast: n/c - before  lunch: 75, 113, 165 >> 114, 192, 229 >> n/c - 2h after lunch: n/c >> 67, 228 >> n/c - before dinner: 98, 115, 157, 251, 383 (sweet drinks) >> 175-245 >> 155-180 (sweet drinks) - 2h after dinner: n/c >> 131, 269 >> n/c - bedtime:81-160 >> n/c >> 240s >> n/c  - nighttime: n/c  Lowest sugar was 103 >> 118 >> 95; it is unclear at which level she has hypoglycemia awareness: Highest sugar was 383 >> 245 >> 300 (cough syrup).  Glucometer: Prodigy >> ReliOn  Pt's meals are: - Breakfast: chicken  - Lunch: peanuts, sandwich + crystal lite drink - Dinner: frozen dinner - Snacks: fruit, small bag potato chips, peanuts She is still drinking some sweet tea, sodas but more water.  -+ Stage III CKD, BUN/creatinine:  Lab Results  Component Value Date   BUN 21 06/25/2018   Lab Results  Component Value Date   CREATININE 1.44 (H) 06/25/2018  08/22/2016: BUN/creatinine 20/1.41 03/18/2016: BUN/creatinine 16/1.21, EGFR 57, Calcium 8.7, AST/ALT 17/24 09/18/2015: BUN/creatinine 14/1.2, GFR 57 (previously 55) 08/01/2014: 15/1.26, GFR 55 On Cozaar.  -+ HL; lipids: Lab Results  Component Value Date   CHOL 134 06/25/2018   HDL 70.20 06/25/2018   LDLCALC 54 06/25/2018   TRIG 47.0 06/25/2018   CHOLHDL 2 06/25/2018  03/18/2016: Lipids: 156/85/70/69 09/18/2015: 160/57/87/62 08/01/2014: 145/78/74/55 On lovastatin 20.  - last eye exam was in  01/2018: + DR; she had laser surgeries and intraocular injections - restarted IO inj for macular edema.  She had left eye cataract surgery.  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  - She did not have RAI treatment at that time, however, she was found to have a thyroid mass in 2005 >> This was  biopsied and returned as normal thyroid tissue, but had RAI treatment at that time.  - The posttreatment whole-body scan was negative for any cancer spread.   Thyroglobulin returned at 2.3 in 08/2014 >> I ordered a neck ultrasound that showed a stable nodule with a fatty hilum, consistent with a benign lymph node, but no other masses in the surgical bed.   The thyroglobulin level remained detectable, and was actually 9.9 in 10/2015.   A neck ultrasound (11/2015) showed a stable 1.4 nodule in the thyroid bed, consistent with a benign process   Her thyroglobulin level continues to be detectable.  Reviewed pertinent labs: Lab Results  Component Value Date   THYROGLB 2.5 (L) 06/25/2018   THYROGLB 1.8 (L) 08/15/2017   THYROGLB 2.5 (L) 08/29/2016   THYROGLB 9.9 11/06/2015   THYROGLB 2.4 (L) 09/18/2015   THYROGLB 0.8 (L) 12/26/2014   THYROGLB 2.3 (L) 08/22/2014   THGAB <1 06/25/2018   THGAB <1 08/15/2017   THGAB <1 08/29/2016   THGAB <1 09/18/2015   THGAB <1.0 12/26/2014   THGAB <1 08/22/2014   THGAB <1.0 08/22/2014   Postsurgical hypothyroidism:  Reviewed patient's TFTs: Lab Results  Component Value Date   TSH 1.39 06/25/2018   TSH 0.61 08/15/2017   TSH 1.82 08/29/2016   TSH 1.03 09/18/2015   TSH 2.72 12/26/2014   FREET4 0.99 06/25/2018   FREET4 1.04 08/15/2017   FREET4 1.30 08/29/2016   FREET4 1.13 09/18/2015   FREET4 1.00 12/26/2014  08/01/2014: TSH 1.89  Pt is on levothyroxine 150 mcg daily, taken: - in am - fasting - at least 30 min from b'fast - no Ca, Fe, MVI, PPIs - + on Biotin (B complex)  No FH of thyroid cancer. No h/o radiation tx to head or neck.  No herbal supplements. No recent steroids use.   Pt denies: - feeling nodules in neck - hoarseness - dysphagia - choking - SOB with lying down  ROS: Constitutional: + weight gain (+10 lbs)/no weight loss, no fatigue, no subjective hyperthermia, no subjective hypothermia Eyes: no blurry vision, no  xerophthalmia ENT: no sore throat, + see HPI Cardiovascular: no CP/no SOB/no palpitations/no leg swelling Respiratory: no cough/no SOB/no wheezing Gastrointestinal: no N/no V/no D/no C/no acid reflux Musculoskeletal: no muscle aches/no joint aches Skin: no rashes, no hair loss Neurological: no tremors/+ numbness/+ tingling/no dizziness  I reviewed pt's medications, allergies, PMH, social hx, family hx, and changes were documented in the history of present illness. Otherwise, unchanged from my initial visit note.  Past Medical History:  Diagnosis Date  . Anemia   . Arthritis   . Chronic kidney disease   . Diabetes mellitus without complication ([place])   . Hypertension   . MRSA infection 2013   on leg; surgery   . Neuropathy due to secondary diabetes ([place])   . Thyroid disease    Past Surgical History:  Procedure Laterality Date  .  BILATERAL CARPAL TUNNEL RELEASE Bilateral 2003  . CATARACT EXTRACTION W/ INTRAOCULAR LENS IMPLANT Left 2016  . CHOLECYSTECTOMY    . 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 EYE SURGERY    . KNEE ARTHROSCOPY Left 05/05/2016   Procedure: ARTHROSCOPY KNEE;  Surgeon: Thornton Park, MD;  Location: [facility] ORS;  Service: Orthopedics;  Laterality: Left;  . SHOULDER ARTHROSCOPY Left 2004  . THYROID SURGERY  2004  . TRIGGER FINGER RELEASE  2003   History   Social History Main Topics  . Smoking status: Former Research scientist (life sciences)  . Smokeless tobacco: Not on file  . Alcohol Use: No  . Drug Use: No   Social History Narrative   Single   0 children   [place]travel[place]      Beer and wine on occasion   First menstrual cycle: 6th grade   Postmenopausal      Current Outpatient Medications  Medication Sig Dispense Refill  . amLODipine (NORVASC) 10 MG tablet Take 10 mg by mouth daily. Reported on 11/06/2015    . hydrochlorothiazide (HYDRODIURIL) 25 MG tablet Take 25 mg by mouth daily.    . insulin NPH Human (HUMULIN N,NOVOLIN N) 100 UNIT/ML injection Inject  40 units in am and 30 units before dinner 20 mL 11  . insulin regular (NOVOLIN R RELION) 100 units/mL injection INJECT 10-35 UNITS SUB-Q 3 TIMES DAILY BEFORE MEALS. ReliOn insulin. 20 mL 11  . Insulin Syringe-Needle U-100 31G X 15/64" 0.5 ML MISC Use 3x a day 100 each 11  . levothyroxine (SYNTHROID, LEVOTHROID) 150 MCG tablet Take 1 tablet (150 mcg total) by mouth daily before breakfast. 90 tablet 3  . losartan (COZAAR) 100 MG tablet Take 100 mg by mouth daily.     Marland Kitchen lovastatin (MEVACOR) 20 MG tablet Take 20 mg by mouth at bedtime.     No current facility-administered medications for this visit.    NKDA   Family History  Problem Relation Age of Onset  . Hypertension Mother   . Thyroid disease Mother   . CVA Mother   . Hypertension Sister   . Thyroid disease Sister   . Hypertension Brother   . Hyperlipidemia Brother    PE: There were no vitals taken for this visit. There is no height or weight on file to calculate BMI. Wt Readings from Last 3 Encounters:  10/23/18 267 lb (121.1 kg)  08/23/18 271 lb 14.4 oz (123.3 kg)  06/28/18 271 lb 9.6 oz (123.2 kg)   Constitutional:  in NAD  The physical exam was not performed (telephone visit).  ASSESSMENT: 1. DM2, insulin-dependent, uncontrolled, with complications - CKD - DR  2. 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 She did not have RAI treatment at that time, however, she was found to have a thyroid mass in 2005. This was biopsied and returned as normal thyroid tissue.She had RAI treatment at that time. The posttreatment whole-body scan was negative for any cancer spread.   - Neck U/S (09/19/2014): 1. Post total thyroidectomy. 2. No change in the previously biopsied approximately 1.4 cm echogenic solid nodule within the inferior aspect of the right lobe of the  thyroid, grossly unchanged since the 2005 examination. 3. Apparent development of an approximately 0.4 cm hypoechoic nodule within the right thyroidectomy bed - while too small for definitive characterization, this nodule appears  to contain an echogenic hilum and thus is favored to represent a non pathologically enlarged cervical lymph node.  - Neck U/S (11/27/2015):  1. Surgical changes of prior total thyroidectomy. 2. Unchanged 1.4 cm echogenic soft tissue nodule in the right thyroid resection bed. Continued stability over time consistent with a benign process.  3. Post surgical hypothyroidism  PLAN:  1. Patient with longstanding, uncontrolled, type 2 diabetes, on basal-bolus insulin regimen, with NPH and regular insulin.  Sugars increased in the last year (latest HbA1c was very high, 13.7%) due to her dietary indiscretions: Late dinners, sweet drinks.  At last visit we discussed at length about the need to stop sweet drinks.  She reduced them, but still drinking sodas and sweet tea.  Her sugars later in the day are still high, but improved compared to before.  I again strongly advised her to stop the sweet drinks but I do not feel that the change in her regimen is needed for now. - I advised her to: Patient Instructions  Please continue: Insulin Before breakfast Before lunch Before dinner  Regular 40 10 (if you eat) 40  NPH 40  30  Please take only 25 units of R insulin if sugars before meals <80. If sugars < or = 60 before meals, skip R insulin with that meal.  Stop regular sodas and sweet tea completely.  Please continue Levothyroxine 150 mcg daily.  Take the thyroid hormone every day, with water, at least 30 minutes before breakfast, separated by at least 4 hours from: - acid reflux medications - calcium - iron - multivitamins  Please return in 3 months with your sugar log.    -We will check an HbA1c when she returns to the clinic - continue checking sugars at different  times of the day - check 3x a day, rotating checks - advised for yearly eye exams >> she is UTD, but needs one soon - Return to clinic in 3 mo with sugar log         2. 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  Latest thyroid ultrasound report reviewed (11/2015) and it showed a stable mass since 2015, intra-thyroidal, consistent with a benign process. -No neck compression symptoms -Her thyroglobulin levels are fluctuating, but they continue to be detectable.  ATA undetectable. -We will recheck ATA and thyroglobulin at next visit.  We will need to check another thyroid ultrasound if the thyroglobulin increases  3. Postsurgical hypothyroidism - latest thyroid labs reviewed with pt >> normal  Lab Results  Component Value Date   TSH 1.39 06/25/2018  - she continues on LT4 150  mcg daily - pt feels good on this dose. - we discussed about taking the thyroid hormone every day, with water, >30 minutes before breakfast, separated by >4 hours from acid reflux medications, calcium, iron, multivitamins. Pt. is taking it correctly. - will check thyroid tests at next OV: TSH and fT4  - time spent with the patient: 15 min, of which >50% was spent in obtaining information about her symptoms, reviewing her previous labs, evaluations, and treatments, counseling her about her condition (please see the discussed topics above), and developing a plan to further investigate and treat it; she had a number of questions which I addressed.  [name], MD PhD [facility] End[facility]

## 2019-03-20 DIAGNOSIS — E113541 Type 2 diabetes mellitus with proliferative diabetic retinopathy with combined traction retinal detachment and rhegmatogenous retinal detachment, right eye: Secondary | ICD-10-CM | POA: Diagnosis not present

## 2019-03-25 DIAGNOSIS — H00011 Hordeolum externum right upper eyelid: Secondary | ICD-10-CM | POA: Diagnosis not present

## 2019-04-25 DIAGNOSIS — E559 Vitamin D deficiency, unspecified: Secondary | ICD-10-CM | POA: Diagnosis not present

## 2019-04-25 DIAGNOSIS — I10 Essential (primary) hypertension: Secondary | ICD-10-CM | POA: Diagnosis not present

## 2019-04-25 DIAGNOSIS — E1165 Type 2 diabetes mellitus with hyperglycemia: Secondary | ICD-10-CM | POA: Diagnosis not present

## 2019-04-25 DIAGNOSIS — E890 Postprocedural hypothyroidism: Secondary | ICD-10-CM | POA: Diagnosis not present

## 2019-04-25 DIAGNOSIS — E785 Hyperlipidemia, unspecified: Secondary | ICD-10-CM | POA: Diagnosis not present

## 2019-04-26 ENCOUNTER — Other Ambulatory Visit: Payer: Self-pay | Admitting: Nurse Practitioner

## 2019-04-26 DIAGNOSIS — E119 Type 2 diabetes mellitus without complications: Secondary | ICD-10-CM | POA: Diagnosis not present

## 2019-04-26 DIAGNOSIS — H5201 Hypermetropia, right eye: Secondary | ICD-10-CM | POA: Diagnosis not present

## 2019-04-26 DIAGNOSIS — Z1231 Encounter for screening mammogram for malignant neoplasm of breast: Secondary | ICD-10-CM

## 2019-05-09 ENCOUNTER — Other Ambulatory Visit: Payer: Self-pay

## 2019-05-13 ENCOUNTER — Other Ambulatory Visit: Payer: Self-pay

## 2019-05-13 ENCOUNTER — Encounter: Payer: Self-pay | Admitting: Internal Medicine

## 2019-05-13 ENCOUNTER — Ambulatory Visit (INDEPENDENT_AMBULATORY_CARE_PROVIDER_SITE_OTHER): Payer: BC Managed Care – PPO | Admitting: Internal Medicine

## 2019-05-13 VITALS — BP 138/88 | HR 84 | Ht 67.5 in | Wt 276.0 lb

## 2019-05-13 DIAGNOSIS — C73 Malignant neoplasm of thyroid gland: Secondary | ICD-10-CM

## 2019-05-13 DIAGNOSIS — E890 Postprocedural hypothyroidism: Secondary | ICD-10-CM

## 2019-05-13 DIAGNOSIS — E1165 Type 2 diabetes mellitus with hyperglycemia: Secondary | ICD-10-CM

## 2019-05-13 DIAGNOSIS — IMO0002 Reserved for concepts with insufficient information to code with codable children: Secondary | ICD-10-CM

## 2019-05-13 DIAGNOSIS — E1121 Type 2 diabetes mellitus with diabetic nephropathy: Secondary | ICD-10-CM | POA: Diagnosis not present

## 2019-05-13 DIAGNOSIS — Z794 Long term (current) use of insulin: Secondary | ICD-10-CM

## 2019-05-13 LAB — POCT GLYCOSYLATED HEMOGLOBIN (HGB A1C): Hemoglobin A1C: 9.8 % — AB (ref 4.0–5.6)

## 2019-05-13 LAB — TSH: TSH: 0.89 u[IU]/mL (ref 0.35–4.50)

## 2019-05-13 LAB — T4, FREE: Free T4: 1.24 ng/dL (ref 0.60–1.60)

## 2019-05-13 NOTE — Patient Instructions (Addendum)
Please continue: Insulin Before breakfast Before lunch Before dinner  Regular 40 10 (if you eat) 40  NPH 40  30  Please take only 25 units of R insulin if sugars before meals <80. If sugars < or = 60 before meals, skip R insulin with that meal.  STOP ALL SWEET DRINKS!  Please continue levothyroxine 150 mcg daily.   Take the thyroid hormone every day, with water, at least 30 minutes before breakfast, separated by at least 4 hours from: - acid reflux medications - calcium - iron - multivitamins  Please stop at the lab.  Please return in 3 months with your sugar log.

## 2019-05-13 NOTE — Progress Notes (Signed)
Patient ID: Courtney [name], female   DOB: 06-Oct-1955, 63 y.o.   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 Last visit 3 months ago (virtual).  Her mother is sick  - in nursing home. She is stressed. Not sleeping well.  Also, eating late and eating out almost every night since she is remodeling her kitchen.  Sugars increased lately.  DM2: Reviewed HbA1c levels: Lab Results  Component Value Date   HGBA1C 13.7 (A) 10/23/2018   HGBA1C 10.1 (A) 06/25/2018   HGBA1C 8.6 (A) 02/14/2018  08/22/2016: HbA1c 11.8% 08/01/2014: HbA1c 14.5% 05/02/2014: HbA1c 14.8% 01/31/2014: HbA1c 13.6% 11/01/2013: HbA1c 15.1%  She was on: - Tresiba U200 70 units in am - ReliOn Novolin to 2-3x a day: - before breakfast and dinner:  20 units with a regular meal  25 units before a larger meal  (15 units before lunch if you eat lunch) - not using Stopped Metformin 500 mg 2x a day - b/c leg swelling. She was on Novolin 70/30 48 units 2x a day 15 min before the meals  She was on Amaryl 8 mg in am She was on Metformin >> stopped 2/2 CKD.  She is currently on:  Insulin Before breakfast Before lunch Before dinner  Regular 40 10 (if you eat) 40  NPH 40  30  Please take only 25 units of R insulin if sugars before meals <80. If sugars < or = 60 before meals, skip R insulin with that meal.  She checks sugars 1-2 times a day: - am: 103-250 >> 118-135 >> 95-135, 175 >> 130-145, (eating later, eating out): up to 295 - 2h after b'fast: n/c - before lunch: 75, 113, 165 >> 114, 192, 229 >> n/c - 2h after lunch: n/c >> 67, 228 >> n/c - before dinner: 175-245 >> 155-180 >> 120-145 - 2h after dinner: n/c >> 131, 269 >> n/c - bedtime:81-160 >> n/c >> 240s >> n/c  - nighttime: n/c  Lowest sugar was  103 >> 118 >> 95 >> 81; it is unclear at which level she has hypoglycemia awareness Highest sugar was 383 >> 245 >> 300 (cough syrup) >> 295.  Glucometer: Prodigy >> ReliOn  Pt's meals are: - Breakfast: chicken  - Lunch: peanuts, sandwich + crystal lite drink - Dinner: frozen dinner - Snacks: fruit, small bag potato chips, peanuts She is still drinking some sweet tea, still drinking sodas.  -+ Stage III CKD, BUN/creatinine:  Lab Results  Component Value Date   BUN 21 06/25/2018   Lab Results  Component Value Date   CREATININE 1.44 (H) 06/25/2018  08/22/2016: BUN/creatinine 20/1.41 03/18/2016: BUN/creatinine 16/1.21, EGFR 57, Calcium 8.7, AST/ALT 17/24 09/18/2015: BUN/creatinine 14/1.2, GFR 57 (previously 55) 08/01/2014: 15/1.26, GFR 55 On Cozaar.  -+ HL; lipids: Lab Results  Component Value Date   CHOL 134 06/25/2018   HDL 70.20 06/25/2018   LDLCALC 54 06/25/2018   TRIG 47.0 06/25/2018   CHOLHDL 2 06/25/2018  03/18/2016: Lipids: 156/85/70/69 09/18/2015: 160/57/87/62 08/01/2014: 145/78/74/55 On lovastatin 20.  - last eye exam was in 01/2018: + DR; she had laser surgeries and intraocular injections - restarted IO inj for macular edema.  She had left eye cataract surgery.  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  - She did not have RAI treatment at that time, however, she was found to have a thyroid mass in 2005 >> This was biopsied and returned as normal thyroid tissue, but had RAI treatment at that time.  - The posttreatment whole-body scan was negative for any cancer spread.   Thyroglobulin returned at 2.3 in 08/2014 >> I ordered a neck ultrasound that showed a stable nodule with a fatty hilum,  consistent with a benign lymph node, but no other masses in the surgical bed.   The thyroglobulin level remained detectable, and was actually 9.9 in 10/2015.   A neck ultrasound (11/2015) showed a stable 1.4 nodule in the thyroid bed, consistent with a benign process   Her thyroglobulin levels continue to remain detectable.  Reviewed pertinent labs: Lab Results  Component Value Date   THYROGLB 2.5 (L) 06/25/2018   THYROGLB 1.8 (L) 08/15/2017   THYROGLB 2.5 (L) 08/29/2016   THYROGLB 9.9 11/06/2015   THYROGLB 2.4 (L) 09/18/2015   THYROGLB 0.8 (L) 12/26/2014   THYROGLB 2.3 (L) 08/22/2014   THGAB <1 06/25/2018   THGAB <1 08/15/2017   THGAB <1 08/29/2016   THGAB <1 09/18/2015   THGAB <1.0 12/26/2014   THGAB <1 08/22/2014   THGAB <1.0 08/22/2014   Postsurgical hypothyroidism:  Reviewed patient's TFTs: Lab Results  Component Value Date   TSH 1.39 06/25/2018   TSH 0.61 08/15/2017   TSH 1.82 08/29/2016   TSH 1.03 09/18/2015   TSH 2.72 12/26/2014   FREET4 0.99 06/25/2018   FREET4 1.04 08/15/2017   FREET4 1.30 08/29/2016   FREET4 1.13 09/18/2015   FREET4 1.00 12/26/2014  08/01/2014: TSH 1.89  Pt is on levothyroxine 150 mcg daily, taken: - in am - fasting - at least 30 min from b'fast - no Ca, Fe, MVI, PPIs - + on Biotin  No FH of thyroid cancer. No h/o radiation tx to head or neck.  No seaweed or kelp. No recent contrast studies. No herbal supplements. No Biotin use. No recent steroids use.   Pt denies: - feeling nodules in neck - hoarseness - dysphagia - choking - SOB with lying down  ROS: Constitutional:  + weight gain/no weight loss, no fatigue, no subjective hyperthermia, no subjective hypothermia Eyes: no blurry vision, no xerophthalmia ENT: no sore throat, + see HPI Cardiovascular: no CP/no SOB/no palpitations/no leg swelling Respiratory: no cough/no SOB/no wheezing Gastrointestinal: no N/no V/no D/no C/no acid reflux Musculoskeletal: no muscle aches/no  joint aches Skin: no rashes, no hair loss Neurological: no tremors/+ numbness/+ tingling/no dizziness  I reviewed pt's medications, allergies, PMH, social hx, family hx, and changes were documented in the history of present illness. Otherwise, unchanged from my initial visit note.  Past Medical History:  Diagnosis Date  . Anemia   . Arthritis   . Chronic kidney disease   . Diabetes mellitus without complication ([place])   . Hypertension   . MRSA infection 2013   on leg; surgery   . Neuropathy due to secondary diabetes ([place])   . Thyroid disease    Past Surgical History:  Procedure Laterality Date  . BILATERAL CARPAL TUNNEL RELEASE Bilateral 2003  . CATARACT EXTRACTION W/ INTRAOCULAR LENS IMPLANT Left 2016  . CHOLECYSTECTOMY    . 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 EYE SURGERY    .  KNEE ARTHROSCOPY Left 05/05/2016   Procedure: ARTHROSCOPY KNEE;  Surgeon: Thornton Park, MD;  Location: [facility] ORS;  Service: Orthopedics;  Laterality: Left;  . SHOULDER ARTHROSCOPY Left 2004  . THYROID SURGERY  2004  . TRIGGER FINGER RELEASE  2003   History   Social History Main Topics  . Smoking status: Former Research scientist (life sciences)  . Smokeless tobacco: Not on file  . Alcohol Use: No  . Drug Use: No   Social History Narrative   Single   0 children   [place]travels [place]      Beer and wine on occasion   First menstrual cycle: 6th grade   Postmenopausal      Current Outpatient Medications  Medication Sig Dispense Refill  . amLODipine (NORVASC) 10 MG tablet Take 10 mg by mouth daily. Reported on 11/06/2015    . hydrochlorothiazide (HYDRODIURIL) 25 MG tablet Take 25 mg by mouth daily.    . insulin NPH Human (HUMULIN N,NOVOLIN N) 100 UNIT/ML injection Inject 40 units in am and 30 units before dinner 20 mL 11  . insulin regular (NOVOLIN R RELION) 100 units/mL injection INJECT 10-35 UNITS SUB-Q 3 TIMES DAILY BEFORE MEALS. ReliOn insulin. 20 mL 11  . Insulin Syringe-Needle U-100 31G X 15/64" 0.5  ML MISC Use 3x a day 100 each 11  . levothyroxine (SYNTHROID, LEVOTHROID) 150 MCG tablet Take 1 tablet (150 mcg total) by mouth daily before breakfast. 90 tablet 3  . losartan (COZAAR) 100 MG tablet Take 100 mg by mouth daily.     Marland Kitchen lovastatin (MEVACOR) 20 MG tablet Take 20 mg by mouth at bedtime.     No current facility-administered medications for this visit.    NKDA   Family History  Problem Relation Age of Onset  . Hypertension Mother   . Thyroid disease Mother   . CVA Mother   . Hypertension Sister   . Thyroid disease Sister   . Hypertension Brother   . Hyperlipidemia Brother    PE: BP 138/88   Pulse 84   Ht 5' 7.5" (1.715 m)   Wt 276 lb (125.2 kg)   SpO2 97%   BMI 42.59 kg/m  Body mass index is 42.59 kg/m. Wt Readings from Last 3 Encounters:  05/13/19 276 lb (125.2 kg)  10/23/18 267 lb (121.1 kg)  08/23/18 271 lb 14.4 oz (123.3 kg)   Constitutional: overweight, in NAD Eyes: PERRLA, EOMI, no exophthalmos ENT: moist mucous membranes, no thyromegaly, no cervical lymphadenopathy Cardiovascular: RRR, No MRG Respiratory: CTA B Gastrointestinal: abdomen soft, NT, ND, BS+ Musculoskeletal: no deformities, strength intact in all 4 Skin: moist, warm, no rashes Neurological: no tremor with outstretched hands, DTR normal in all 4  ASSESSMENT: 1. DM2, insulin-dependent, uncontrolled, with complications - CKD - DR  2. 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 She did not have RAI treatment at that time, however, she was found to have a thyroid mass in 2005. This was biopsied and returned as normal thyroid tissue.She had RAI treatment at that time. The posttreatment whole-body scan was negative for any cancer spread.   - Neck U/S (09/19/2014): 1. Post total thyroidectomy. 2. No change in the previously  biopsied approximately 1.4 cm echogenic solid nodule within the inferior aspect of the right lobe of the thyroid, grossly unchanged since the 2005 examination. 3. Apparent development of an  approximately 0.4 cm hypoechoic nodule within the right thyroidectomy bed - while too small for definitive characterization, this nodule appears to contain an echogenic hilum and thus is favored to represent a non pathologically enlarged cervical lymph node.  - Neck U/S (11/27/2015):  1. Surgical changes of prior total thyroidectomy. 2. Unchanged 1.4 cm echogenic soft tissue nodule in the right thyroid resection bed. Continued stability over time consistent with a benign process.  3. Post surgical hypothyroidism  PLAN:  1. Patient with longstanding, uncontrolled, type 2 diabetes, on basal/bolus insulin regimen with NPH and regular insulin.  At last check, her HbA1c was very high, 13.7% due to dietary indiscretions (she was still drinking sodas and sweet tea).  I strongly advised her to stop.  We did not change the regimen at that time -At this visit, she feels that her sugars are worse the last few weeks since she is remodeling her kitchen and she was eating out most of her dinners and also eating late. Also, she is stressed with her mother being sick in the nursing home.  She is not sleeping well.  She is still drinking sodas and sweet tea.  I again strongly advised her to stop.  She is also eating sugary cereals (fruit loops), which is also advised her to stop.   -Overall, reviewing sugars at home, they are appear better before dinner but they are slightly above target in the morning. HbA1c today: 9.8% (improved), but higher than expected from her log.  My suspicion is that we are missing many high blood sugars, especially after dietary indiscretions. -At this visit, we discussed about the importance of eating at home, earlier.  No changes are needed in her regimen for now.  She absolutely needs to start  improving her diet - I advised her to: Patient Instructions  Please continue: Insulin Before breakfast Before lunch Before dinner  Regular 40 10 (if you eat) 40  NPH 40  30  Please take only 25 units of R insulin if sugars before meals <80. If sugars < or = 60 before meals, skip R insulin with that meal.  STOP ALL SWEET DRINKS!  Please continue levothyroxine 150 mcg daily.   Take the thyroid hormone every day, with water, at least 30 minutes before breakfast, separated by at least 4 hours from: - acid reflux medications - calcium - iron - multivitamins  Please stop at the lab.  Please return in 3 months with your sugar log.    - advised to check sugars at different times of the day - 3x a day, rotating check times - advised for yearly eye exams >> she is not UTD - return to clinic in 3 months         2. 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  Latest thyroid ultrasound report reviewed (11/2015) and it showed a stable mass since 2015, intra-thyroidal, consistent with a benign process. -No neck compression symptoms -Her thyroglobulin levels are fluctuating, and they continue to be detectable -We will recheck her thyroglobulin + ATA.  We will need to check another thyroid ultrasound if there is a clear trend of increasing thyroglobulin  3. Postsurgical hypothyroidism - latest thyroid labs reviewed with pt >> normal 06/2018 - she continues on LT4 150 mcg daily - pt feels good on this dose. - we discussed about taking the thyroid hormone every day, with water, >30 minutes before breakfast, separated by >4 hours  from acid reflux medications, calcium, iron, multivitamins. Pt. is taking it correctly. - will check thyroid tests today: TSH and fT4 - If labs are abnormal, she will need to return for repeat TFTs in 1.5 months  Component     Latest Ref Rng & Units 05/13/2019  Thyroglobulin      ng/mL 2.1 (L)  Comment        Thyroglobulin Ab     < or = 1 IU/mL <1  TSH     0.35 - 4.50 uIU/mL 0.89  T4,Free(Direct)     0.60 - 1.60 ng/dL 1.24   Thyroglobulin still detectable but lower than before.  The rest of the labs are normal.  [name], MD PhD [facility]

## 2019-05-14 LAB — THYROGLOBULIN LEVEL: Thyroglobulin: 2.1 ng/mL — ABNORMAL LOW

## 2019-05-14 LAB — THYROGLOBULIN ANTIBODY: Thyroglobulin Ab: <1 IU/mL (ref <=1)

## 2019-05-22 DIAGNOSIS — E113513 Type 2 diabetes mellitus with proliferative diabetic retinopathy with macular edema, bilateral: Secondary | ICD-10-CM | POA: Diagnosis not present

## 2019-05-22 DIAGNOSIS — E113511 Type 2 diabetes mellitus with proliferative diabetic retinopathy with macular edema, right eye: Secondary | ICD-10-CM | POA: Diagnosis not present

## 2019-05-22 LAB — HM DIABETES EYE EXAM

## 2019-06-13 ENCOUNTER — Ambulatory Visit
Admission: RE | Admit: 2019-06-13 | Discharge: 2019-06-13 | Disposition: A | Discharge status: Home / Self Care | Payer: BC Managed Care – PPO | Source: Ambulatory Visit | Attending: Nurse Practitioner | Admitting: Nurse Practitioner

## 2019-06-13 ENCOUNTER — Other Ambulatory Visit: Payer: Self-pay

## 2019-06-13 DIAGNOSIS — Z1231 Encounter for screening mammogram for malignant neoplasm of breast: Secondary | ICD-10-CM | POA: Diagnosis not present

## 2019-07-03 DIAGNOSIS — E113593 Type 2 diabetes mellitus with proliferative diabetic retinopathy without macular edema, bilateral: Secondary | ICD-10-CM | POA: Diagnosis not present

## 2019-08-12 ENCOUNTER — Encounter: Payer: Self-pay | Admitting: Internal Medicine

## 2019-08-12 ENCOUNTER — Other Ambulatory Visit: Payer: Self-pay

## 2019-08-12 ENCOUNTER — Ambulatory Visit (INDEPENDENT_AMBULATORY_CARE_PROVIDER_SITE_OTHER): Payer: BC Managed Care – PPO | Admitting: Internal Medicine

## 2019-08-12 DIAGNOSIS — C73 Malignant neoplasm of thyroid gland: Secondary | ICD-10-CM | POA: Diagnosis not present

## 2019-08-12 DIAGNOSIS — E890 Postprocedural hypothyroidism: Secondary | ICD-10-CM | POA: Diagnosis not present

## 2019-08-12 DIAGNOSIS — E1165 Type 2 diabetes mellitus with hyperglycemia: Secondary | ICD-10-CM | POA: Diagnosis not present

## 2019-08-12 DIAGNOSIS — E1121 Type 2 diabetes mellitus with diabetic nephropathy: Secondary | ICD-10-CM

## 2019-08-12 DIAGNOSIS — IMO0002 Reserved for concepts with insufficient information to code with codable children: Secondary | ICD-10-CM

## 2019-08-12 DIAGNOSIS — Z794 Long term (current) use of insulin: Secondary | ICD-10-CM

## 2019-08-12 NOTE — Progress Notes (Signed)
Patient ID: Courtney [name], female   DOB: 12/11/1955, 63 y.o.   MRN: [id number]  Patient location: Home My location: Office  Referring Provider: Conroy, Nathan, PA-C  I connected with the patient on 08/12/19 at  1:02 PM EST by telephone and verified that I am speaking with the correct person.   I discussed the limitations of evaluation and management by telephone and the availability of in person appointments. The patient expressed understanding and agreed to proceed.   Details of the encounter are shown below.  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 Last visit 3 months ago.  At last visit, she was stressed and not sleeping well as mother was taking the nursing home. Since then, she passed away.   DM2: Reviewed HbA1c levels: Lab Results  Component Value Date   HGBA1C 9.8 (A) 05/13/2019   HGBA1C 13.7 (A) 10/23/2018   HGBA1C 10.1 (A) 06/25/2018  08/22/2016: HbA1c 11.8% 08/01/2014: HbA1c 14.5% 05/02/2014: HbA1c 14.8% 01/31/2014: HbA1c 13.6% 11/01/2013: HbA1c 15.1%  She was on: - Tresiba U200 70 units in am - ReliOn Novolin to 2-3x a day: - before breakfast and dinner:  20 units with a regular meal  25 units before a larger meal  (15 units before lunch if you eat lunch) - not using Stopped Metformin 500 mg 2x a day - b/c leg swelling. She was on Novolin 70/30 48 units 2x a day 15 min before the meals  She was on Amaryl 8 mg in am She was on Metformin >> stopped 2/2 CKD.  She is currently on:  Insulin Before breakfast Before lunch Before dinner  Regular 40 10 (if you eat) 40  NPH 40  30  Please take only 25 units of R insulin if sugars before meals <80. If sugars < or = 60 before meals, skip R insulin with that meal.  She checks sugars 1-2 times a day: - am:  95-135, 175 >> 130-145, 295 when eating out or late >> 139-150s, 200s if eating late - 2h after b'fast: n/c - before lunch: 75, 113, 165 >> 114, 192, 229 >> n/c - 2h after lunch: n/c >> 67, 228 >> n/c - before dinner: 155-180 >> 120-145 >> 150-160 (occas. snack) - 2h after dinner: n/c >> 131, 269 >> n/c - bedtime: 81-160 >> n/c >> 240s >> n/c >> 287 - nighttime: n/c  Lowest sugar was 81 >> 139; it is unclear at which level she has hypoglycemia awareness. Highest sugar was 300 (cough syrup) >> 295 >> 287 (cake).  Glucometer: Prodigy >> ReliOn  Pt's meals are: - Breakfast: chicken  - Lunch: peanuts, sandwich + crystal lite drink - Dinner: frozen dinner - Snacks: fruit, small bag potato chips, peanuts At last visit she was drinking sweet tea and sodas and I strongly advised her to stop.  -+ Stage III CKD BUN/creatinine:  Lab Results  Component Value Date   BUN 21 06/25/2018   Lab Results  Component Value Date   CREATININE 1.44 (H) 06/25/2018  08/22/2016: BUN/creatinine 20/1.41 03/18/2016: BUN/creatinine 16/1.21, EGFR 57, Calcium 8.7, AST/ALT 17/24 09/18/2015: BUN/creatinine 14/1.2, GFR 57 (previously 55) 08/01/2014: 15/1.26, GFR 55 On Cozaar.  -+ HL; lipids: Lab Results  Component Value Date   CHOL 134 06/25/2018   HDL 70.20 06/25/2018   LDLCALC 54 06/25/2018   TRIG 47.0   06/25/2018   CHOLHDL 2 06/25/2018  03/18/2016: Lipids: 156/85/70/69 09/18/2015: 160/57/87/62 08/01/2014: 145/78/74/55 On lovastatin 20.  - last eye exam was on 05/22/2019: + DR; she had laser surgeries and intraocular injections -before last visit she restarted IO inj for macular edema.  She had left eye cataract surgery.  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  - She did not have RAI treatment at that time, however, she was found to have a thyroid mass in 2005 >> This was biopsied and returned as normal thyroid tissue, but had RAI treatment at that time.  - The posttreatment whole-body scan was negative for any cancer spread.   Thyroglobulin returned at 2.3 in 08/2014 >> I ordered a neck ultrasound that showed a stable nodule with a fatty hilum, consistent with a benign lymph node, but no other masses in the surgical bed.   The thyroglobulin level remained detectable, and was actually 9.9 in 10/2015.   A neck ultrasound (11/2015) showed a stable 1.4 nodule in the thyroid bed, consistent with a benign process   Her thyroglobulin levels continue to remain detectable.  Reviewed pertinent labs: Lab Results  Component Value Date   THYROGLB 2.1 (L) 05/13/2019   THYROGLB 2.5 (L) 06/25/2018   THYROGLB 1.8 (L) 08/15/2017   THYROGLB 2.5 (L) 08/29/2016   THYROGLB 9.9 11/06/2015   THYROGLB 2.4 (L) 09/18/2015   THYROGLB 0.8 (L) 12/26/2014   THYROGLB 2.3 (L) 08/22/2014   THGAB <1 05/13/2019   THGAB <1 06/25/2018   THGAB <1 08/15/2017   THGAB <1 08/29/2016   THGAB <1 09/18/2015   THGAB <1.0 12/26/2014   THGAB <1 08/22/2014   THGAB <1.0 08/22/2014   Postsurgical hypothyroidism:  Reviewed patient's TFTs: Lab Results  Component Value Date   TSH 0.89 05/13/2019   TSH 1.39 06/25/2018   TSH 0.61 08/15/2017   TSH 1.82 08/29/2016   TSH 1.03 09/18/2015   FREET4 1.24 05/13/2019   FREET4 0.99 06/25/2018   FREET4 1.04 08/15/2017   FREET4 1.30 08/29/2016   FREET4 1.13 09/18/2015  08/01/2014: TSH 1.89  Pt is on levothyroxine 150 mcg daily, taken: - in am - fasting - at least 30 min from b'fast - no Ca, Fe, MVI, PPIs - + on Biotin  No FH of thyroid cancer. No h/o radiation tx to head or neck.  No seaweed or kelp. No recent contrast studies. No herbal supplements. No Biotin use. No recent steroids use.   Pt denies: -  feeling nodules in neck - hoarseness - dysphagia - choking - SOB with lying down  ROS: Constitutional: no weight gain/no weight loss, no fatigue, no subjective hyperthermia, no subjective hypothermia Eyes: no blurry vision, no xerophthalmia ENT: no sore throat, + see HPI Cardiovascular: no CP/no SOB/no palpitations/no leg swelling Respiratory: no cough/no SOB/no wheezing Gastrointestinal: no N/no V/no D/no C/no acid reflux Musculoskeletal: no muscle aches/no joint aches Skin: no rashes, no hair loss Neurological: no tremors/+ numbness/+ tingling/no dizziness  I reviewed pt's medications, allergies, PMH, social hx, family hx, and changes were documented in the history of present illness. Otherwise, unchanged from my initial visit note.  Past Medical History:  Diagnosis Date  . Anemia   . Arthritis   . Chronic kidney disease   .  Diabetes mellitus without complication (HCC)   . Hypertension   . MRSA infection 2013   on leg; surgery   . Neuropathy due to secondary diabetes (HCC)   . Thyroid disease    Past Surgical History:  Procedure Laterality Date  . BILATERAL CARPAL TUNNEL RELEASE Bilateral 2003  . CATARACT EXTRACTION W/ INTRAOCULAR LENS IMPLANT Left 2016  . CHOLECYSTECTOMY    . 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 EYE SURGERY    . KNEE ARTHROSCOPY Left 05/05/2016   Procedure: ARTHROSCOPY KNEE;  Surgeon: Kevin Krasinski, MD;  Location: [facility] ORS;  Service: Orthopedics;  Laterality: Left;  . SHOULDER ARTHROSCOPY Left 2004  . THYROID SURGERY  2004  . TRIGGER FINGER RELEASE  2003   History   Social History Main Topics  . Smoking status: Former Smoker  . Smokeless tobacco: Not on file  . Alcohol Use: No  . Drug Use: No   Social History Narrative   Single   0 children   Merchandise sales (travels regularly)      Beer and wine on occasion   First menstrual cycle: 6th grade   Postmenopausal      Current Outpatient Medications  Medication Sig Dispense Refill  .  amLODipine (NORVASC) 10 MG tablet Take 10 mg by mouth daily. Reported on 11/06/2015    . hydrochlorothiazide (HYDRODIURIL) 25 MG tablet Take 25 mg by mouth daily.    . insulin NPH Human (HUMULIN N,NOVOLIN N) 100 UNIT/ML injection Inject 40 units in am and 30 units before dinner 20 mL 11  . insulin regular (NOVOLIN R RELION) 100 units/mL injection INJECT 10-35 UNITS SUB-Q 3 TIMES DAILY BEFORE MEALS. ReliOn insulin. 20 mL 11  . Insulin Syringe-Needle U-100 31G X 15/64" 0.5 ML MISC Use 3x a day 100 each 11  . levothyroxine (SYNTHROID, LEVOTHROID) 150 MCG tablet Take 1 tablet (150 mcg total) by mouth daily before breakfast. 90 tablet 3  . losartan (COZAAR) 100 MG tablet Take 100 mg by mouth daily.     . lovastatin (MEVACOR) 20 MG tablet Take 20 mg by mouth at bedtime.     No current facility-administered medications for this visit.    NKDA   Family History  Problem Relation Age of Onset  . Hypertension Mother   . Thyroid disease Mother   . CVA Mother   . Hypertension Sister   . Thyroid disease Sister   . Hypertension Brother   . Hyperlipidemia Brother    PE: There were no vitals taken for this visit. There is no height or weight on file to calculate BMI. Wt Readings from Last 3 Encounters:  05/13/19 276 lb (125.2 kg)  10/23/18 267 lb (121.1 kg)  08/23/18 271 lb 14.4 oz (123.3 kg)   Constitutional:  in NAD  The physical exam was not performed (telephone visit).  ASSESSMENT: 1. DM2, insulin-dependent, uncontrolled, with complications - CKD - DR  2. 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 She did not have RAI treatment at that time, however, she was found to have a thyroid mass in 2005. This was biopsied and returned as normal thyroid tissue.She had RAI treatment at that time. The posttreatment whole-body scan was  negative for any cancer spread.   - Neck U/S (09/19/2014): 1. Post total thyroidectomy. 2. No change in the previously biopsied approximately 1.4 cm echogenic   solid nodule within the inferior aspect of the right lobe of the thyroid, grossly unchanged since the 2005 examination. 3. Apparent development of an approximately 0.4 cm hypoechoic nodule within the right thyroidectomy bed - while too small for definitive characterization, this nodule appears to contain an echogenic hilum and thus is favored to represent a non pathologically enlarged cervical lymph node.  - Neck U/S (11/27/2015):  1. Surgical changes of prior total thyroidectomy. 2. Unchanged 1.4 cm echogenic soft tissue nodule in the right thyroid resection bed. Continued stability over time consistent with a benign process.  3. Post surgical hypothyroidism  PLAN:  1. Patient with longstanding, uncontrolled, type 2 diabetes, on basal/bolus insulin regimen with NPH and regular insulin.  At last visit, HbA1c started to improve (9.8%, decreased from 13.7%).  In the past, her sugars increased due to dietary indiscretions (sodas and sweet tea).  I strongly advised her to stop these.  She was also eating sugary cereals (fruit loops), which I also advised her to stop and we discussed about healthier options for breakfast.  At last visit, sugars were better before dinner but they were slightly above target in the morning.  Besides discussing about improving diet, we did not change her regimen at that time. - at this visit, sugars are slightly higher than goal, but she is not really on an empty stomach between meals (snacking).  Also, she may eat at night in which case her sugars are higher in the morning.  We discussed about not eating at night, but if she eats, to cover this with regular insulin.  Otherwise, we will not change her regimen for now. - I advised her to: Patient Instructions  Please continue: Insulin Before breakfast Before  lunch Before dinner  Regular 40 10 (if you eat) 40  NPH 40  30  Please take only 25 units of R insulin if sugars before meals <80. If sugars < or = 60 before meals, skip R insulin with that meal.  Please continue levothyroxine 150 mcg daily.  Take the thyroid hormone every day, with water, at least 30 minutes before breakfast, separated by at least 4 hours from: - acid reflux medications - calcium - iron - multivitamins  Please return in 3-4 months with your sugar log.   - we will check her HbA1c when she returns to the clinic - advised to check sugars at different times of the day - 3x a day, rotating check times - advised for yearly eye exams >> she is UTD - return to clinic in 3-4 months        2. 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  Latest thyroid ultrasound was reviewed (11/2015) and this showed a stable mass since 2018, in the thyroid ultrasound, consistent with a benign process -No neck compression symptoms -Her thyroglobulin levels are fluctuating, with the latest Tg still detectable, but lower than before -We will repeat thyroglobulin and ATA at next visit -We will check another thyroid ultrasound after next visit, if there is a trend of increasing thyroglobulin  3. Postsurgical hypothyroidism - latest thyroid labs reviewed with pt >> normal: Lab Results  Component Value Date   TSH 0.89 05/13/2019   - she continues on LT4 150 mcg daily - pt feels good on this dose. - we discussed about taking the thyroid hormone every day, with water, >30 minutes before breakfast, separated by >4 hours from acid reflux medications, calcium,   iron, multivitamins. Pt. is taking it correctly.  - time spent with the patient: 15 min, of which >50% was spent in obtaining information about her symptoms, reviewing her previous labs, evaluations, and treatments, counseling her about her conditions  (please see the discussed topics above), and developing a plan to further investigate and treat them; she had a number of questions which I addressed.  [name], MD PhD [facility] Endocrinology

## 2019-08-12 NOTE — Patient Instructions (Addendum)
Please continue: Insulin Before breakfast Before lunch Before dinner  Regular 40 10 (if you eat) 40  NPH 40  30  Please take only 25 units of R insulin if sugars before meals <80. If sugars < or = 60 before meals, skip R insulin with that meal.  If you are eating at night, may need regular insulin before food.  Please continue levothyroxine 150 mcg daily.  Take the thyroid hormone every day, with water, at least 30 minutes before breakfast, separated by at least 4 hours from: - acid reflux medications - calcium - iron - multivitamins  Please return in 3-4 months with your sugar log.

## 2019-09-09 DIAGNOSIS — Z20828 Contact with and (suspected) exposure to other viral communicable diseases: Secondary | ICD-10-CM | POA: Diagnosis not present

## 2019-10-01 DIAGNOSIS — I10 Essential (primary) hypertension: Secondary | ICD-10-CM | POA: Diagnosis not present

## 2019-10-03 IMAGING — MG MM DIGITAL SCREENING BILAT W/ TOMO W/ CAD
6 of 12 series · 6 of 36 positions shown · non-contrast
Comparison: Previous exam(s).

ACR Breast Density Category a: The breast tissue is almost entirely
fatty.

CLINICAL DATA: Screening.

EXAM:
DIGITAL SCREENING BILATERAL MAMMOGRAM WITH TOMO AND CAD

[R MLO synth-2D]
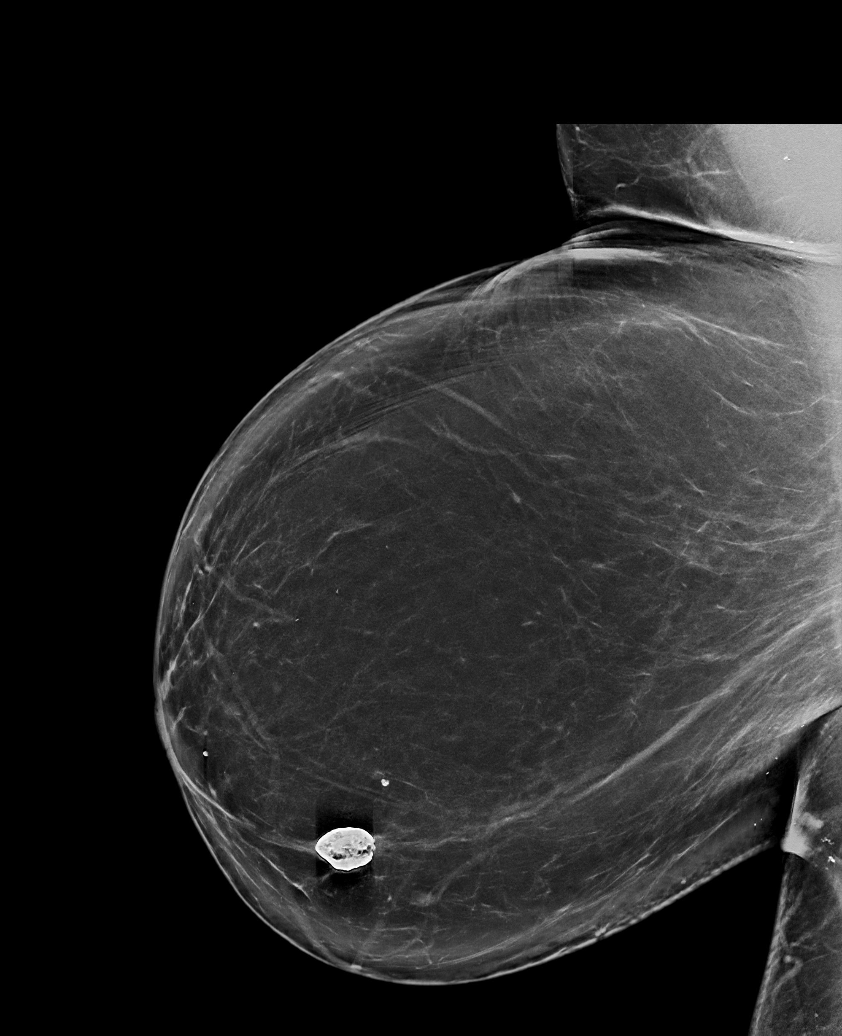

[L MLO synth-2D (1 of 3)]
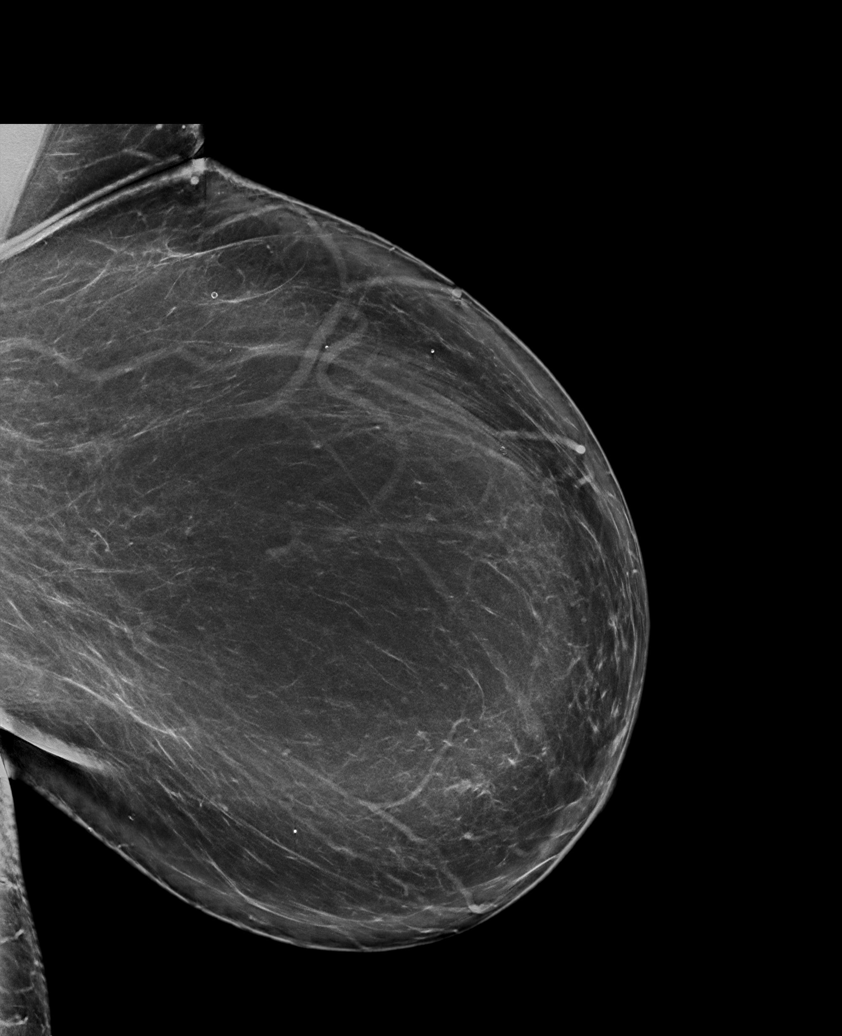

[L MLO synth-2D (2 of 3)]
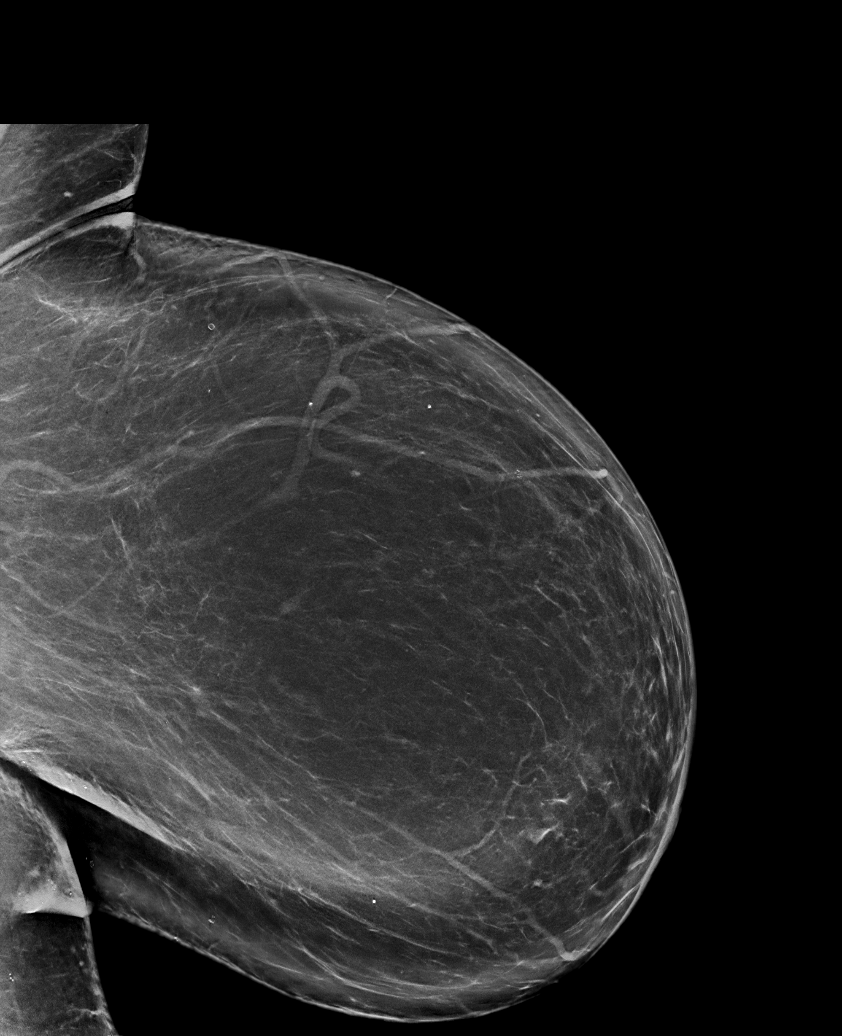

[L MLO synth-2D (3 of 3)]
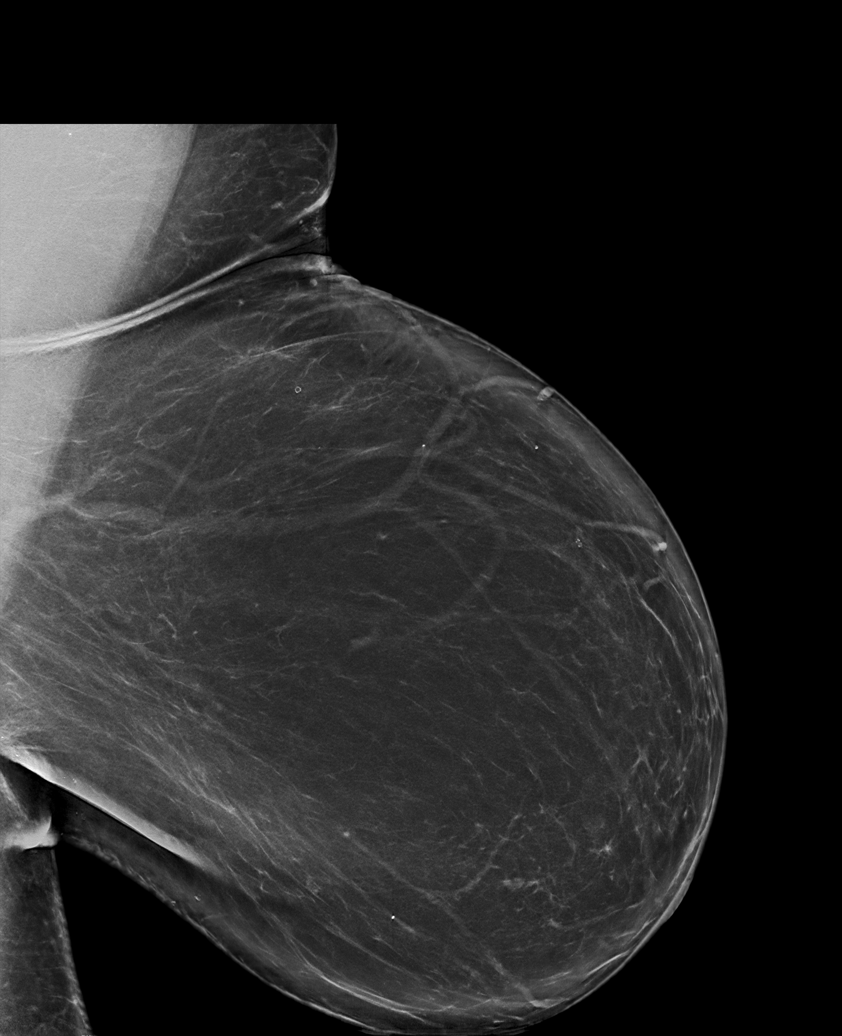

[L CC synth-2D]
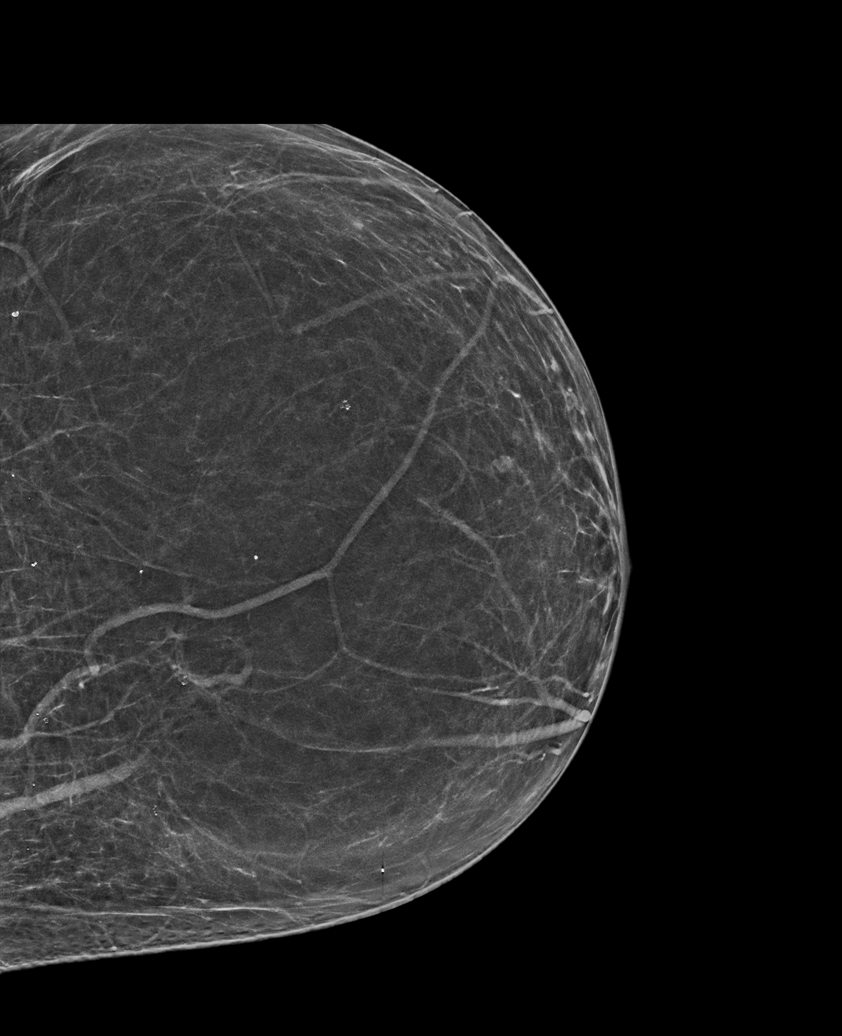

[R CC synth-2D]
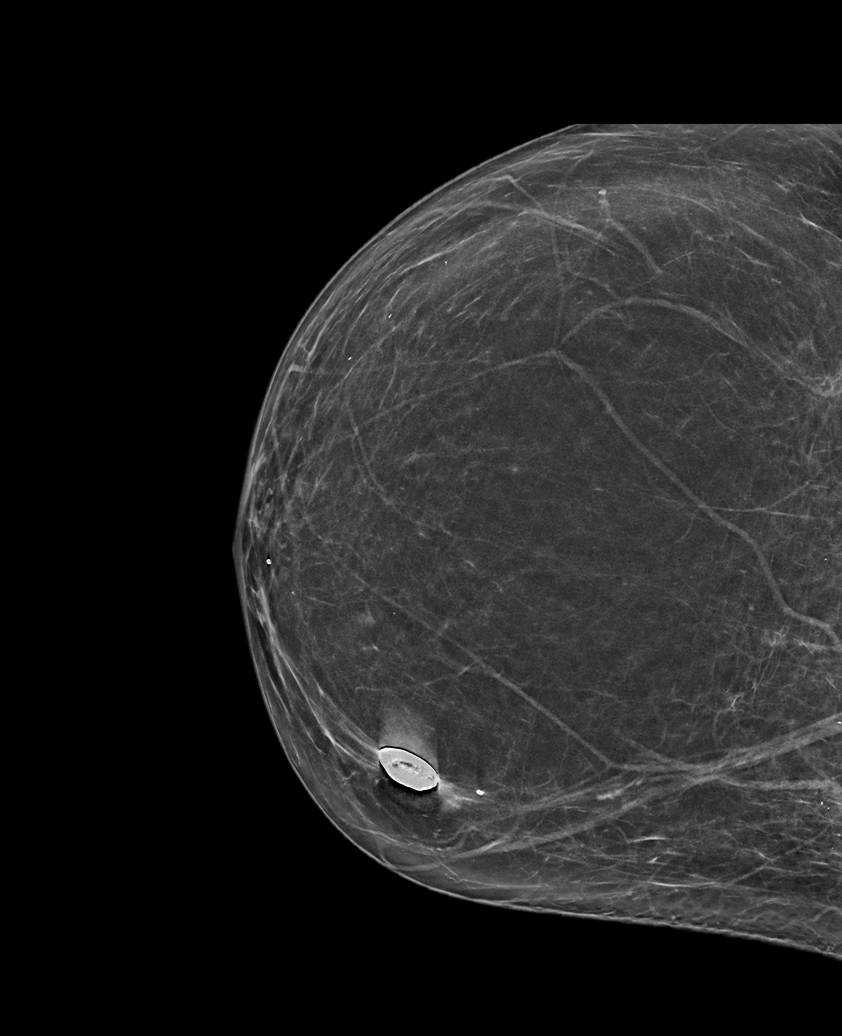

[6 of 36 positions shown; findings below may reference images not displayed]

FINDINGS: There are no findings suspicious for malignancy. Images were
processed with CAD.
IMPRESSION: No mammographic evidence of malignancy. A result letter of this
screening mammogram will be mailed directly to the patient.

RECOMMENDATION:
Screening mammogram in one year. (Code:8Y-[id number])

BI-RADS CATEGORY  1: Negative.

## 2019-10-28 DIAGNOSIS — E785 Hyperlipidemia, unspecified: Secondary | ICD-10-CM | POA: Diagnosis not present

## 2019-10-28 DIAGNOSIS — E890 Postprocedural hypothyroidism: Secondary | ICD-10-CM | POA: Diagnosis not present

## 2019-10-28 DIAGNOSIS — E559 Vitamin D deficiency, unspecified: Secondary | ICD-10-CM | POA: Diagnosis not present

## 2019-10-28 DIAGNOSIS — Z2821 Immunization not carried out because of patient refusal: Secondary | ICD-10-CM | POA: Diagnosis not present

## 2019-10-28 DIAGNOSIS — E1165 Type 2 diabetes mellitus with hyperglycemia: Secondary | ICD-10-CM | POA: Diagnosis not present

## 2019-10-28 DIAGNOSIS — I10 Essential (primary) hypertension: Secondary | ICD-10-CM | POA: Diagnosis not present

## 2019-10-28 DIAGNOSIS — Z1331 Encounter for screening for depression: Secondary | ICD-10-CM | POA: Diagnosis not present

## 2019-10-30 ENCOUNTER — Encounter: Payer: Self-pay | Admitting: Internal Medicine

## 2019-10-30 NOTE — Progress Notes (Signed)
Received labs from [facility], drawn on 10/28/2019: Glucose 96, BUN/creatinine 12/1.47, GFR 43 TSH 1.6 Lipids: 166/88/91/60 Vitamin D 39.2

## 2019-11-13 ENCOUNTER — Encounter: Payer: Self-pay | Admitting: Internal Medicine

## 2019-11-13 ENCOUNTER — Other Ambulatory Visit: Payer: Self-pay

## 2019-11-13 ENCOUNTER — Ambulatory Visit: Payer: BC Managed Care – PPO | Admitting: Internal Medicine

## 2019-11-13 VITALS — BP 148/70 | HR 90 | Ht 67.5 in | Wt 273.0 lb

## 2019-11-13 DIAGNOSIS — Z794 Long term (current) use of insulin: Secondary | ICD-10-CM

## 2019-11-13 DIAGNOSIS — C73 Malignant neoplasm of thyroid gland: Secondary | ICD-10-CM | POA: Diagnosis not present

## 2019-11-13 DIAGNOSIS — E1165 Type 2 diabetes mellitus with hyperglycemia: Secondary | ICD-10-CM

## 2019-11-13 DIAGNOSIS — E890 Postprocedural hypothyroidism: Secondary | ICD-10-CM | POA: Diagnosis not present

## 2019-11-13 DIAGNOSIS — IMO0002 Reserved for concepts with insufficient information to code with codable children: Secondary | ICD-10-CM

## 2019-11-13 DIAGNOSIS — E1121 Type 2 diabetes mellitus with diabetic nephropathy: Secondary | ICD-10-CM | POA: Diagnosis not present

## 2019-11-13 LAB — POCT GLYCOSYLATED HEMOGLOBIN (HGB A1C): Hemoglobin A1C: 12.6 % — AB (ref 4.0–5.6)

## 2019-11-13 NOTE — Progress Notes (Signed)
Patient ID: Courtney [name], female   DOB: 12/22/1955, 64 y.o.   MRN: [id number]  This visit occurred during the SARS-CoV-2 public health emergency.  Safety protocols were in place, including screening questions prior to the visit, additional usage of staff PPE, and extensive cleaning of exam room while observing appropriate contact time as indicated for disinfecting solutions.   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 Last visit 3.5 months ago.  She had Covid19 08/2019: fever, chills, fatigue, lost taste. She still has fatigue.   DM2: Reviewed HbA1c levels: Lab Results  Component Value Date   HGBA1C 9.8 (A) 05/13/2019   HGBA1C 13.7 (A) 10/23/2018   HGBA1C 10.1 (A) 06/25/2018  08/22/2016: HbA1c 11.8% 08/01/2014: HbA1c 14.5% 05/02/2014: HbA1c 14.8% 01/31/2014: HbA1c 13.6% 11/01/2013: HbA1c 15.1%  She was on: - Tresiba U200 70 units in am - ReliOn Novolin to 2-3x a day: - before breakfast and dinner:  20 units with a regular meal  25 units before a larger meal  (15 units before lunch if you eat lunch) - not using Stopped Metformin 500 mg 2x a day - b/c leg swelling. She was on Novolin 70/30 48 units 2x a day 15 min before the meals  She was on Amaryl 8 mg in am She was on Metformin >> stopped 2/2 CKD.  She is currently on: (MAY EAT ONLY A MEAL A DAY -does not take MPHR insulin when she misses a meal) Insulin Before breakfast Before lunch Before dinner  Regular 40 10 (if you eat) 40  NPH 40  30  Please take only 25 units of R insulin if sugars before meals <80. If sugars < or = 60 before meals, skip R insulin with that meal.  She checks sugars 1-2 times a day: - am: 130-145, 295 >> 139-150s, 200s if eating late >> [phone] - 2h after b'fast: n/c - before lunch: 75,  113, 165 >> 114, 192, 229 >> n/c - 2h after lunch: n/c >> 67, 228 >> n/c - before dinner: 155-180 >> 120-145 >> 150-160 >> 140-150 - 2h after dinner: n/c >> 131, 269 >> n/c - bedtime: 81-160 >> n/c >> 240s >> n/c >> 287 >> n/c - nighttime: n/c  Lowest sugar was 81 >> 139 >> 98; it is unclear at which level she has hypoglycemia awareness. Highest sugar was 287 (cake) >> 251.  Glucometer: Prodigy >> ReliOn  Pt's meals are: - Breakfast: chicken  - Lunch: peanuts, sandwich + crystal lite drink - Dinner: frozen dinner - Snacks: fruit, small bag potato chips, peanuts She was drinking sweet tea and sodas in the past and I strongly advised her to stop >> now "every now and then".  -+ Stage III CKD; BUN/creatinine:  10/28/2019: Glucose 96, BUN/creatinine 12/1.47, GFR 43 Lab Results  Component Value Date   BUN 21 06/25/2018   Lab Results  Component Value Date   CREATININE 1.44 (H) 06/25/2018  08/22/2016: BUN/creatinine 20/1.41 03/18/2016: BUN/creatinine 16/1.21, EGFR 57, Calcium 8.7, AST/ALT 17/24 09/18/2015: BUN/creatinine 14/1.2, GFR 57 (previously 55) 08/01/2014: 15/1.26, GFR 55 She was on Cozaar >> now off 2/2 cough.  -+ HL; lipids: 10/28/2019: 166/88/91/60 Lab Results  Component Value Date   CHOL 134 06/25/2018   HDL 70.20 06/25/2018   LDLCALC 54 06/25/2018   TRIG 47.0 06/25/2018   CHOLHDL 2 06/25/2018  03/18/2016: Lipids: 156/85/70/69 09/18/2015: 160/57/87/62 08/01/2014: 145/78/74/55 On lovastatin 20.  - last eye exam was on 05/22/2019: + DR; she had laser surgeries and intraocular injections -before last visit she restarted IO inj for macular edema.  She had left eye cataract surgery.  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  - She did not have RAI treatment at that time, however, she was found to have a thyroid mass in 2005 >> This was biopsied and returned as normal thyroid tissue, but had RAI treatment at that time.  - The posttreatment whole-body scan was negative for any cancer spread.   Thyroglobulin returned at 2.3 in 08/2014 >> I ordered a neck ultrasound that showed a stable nodule with a fatty hilum, consistent with a benign lymph node, but no other masses in the surgical bed.   The thyroglobulin level remained detectable, and was actually 9.9 in 10/2015.   A neck ultrasound (11/2015) showed a stable 1.4 nodule in the thyroid bed, consistent with a benign process   Her thyroglobulin is continued to remain detectable.  Reviewed pertinent labs: Lab Results  Component Value Date   THYROGLB 2.1 (L) 05/13/2019   THYROGLB 2.5 (L) 06/25/2018   THYROGLB 1.8 (L) 08/15/2017   THYROGLB 2.5 (L) 08/29/2016   THYROGLB 9.9 11/06/2015   THYROGLB 2.4 (L) 09/18/2015   THYROGLB 0.8 (L) 12/26/2014   THYROGLB 2.3 (L) 08/22/2014   THGAB <1 05/13/2019   THGAB <1 06/25/2018   THGAB <1 08/15/2017   THGAB <1 08/29/2016   THGAB <1 09/18/2015   THGAB <1.0 12/26/2014   THGAB <1 08/22/2014   THGAB <1.0 08/22/2014   Postsurgical hypothyroidism:  Reviewed her TFTs: 10/28/2019: TSH 1.6 Lab Results  Component Value Date   TSH 0.89 05/13/2019   TSH 1.39 06/25/2018   TSH 0.61 08/15/2017   TSH 1.82 08/29/2016   TSH 1.03 09/18/2015   FREET4 1.24 05/13/2019   FREET4 0.99 06/25/2018   FREET4 1.04 08/15/2017   FREET4 1.30 08/29/2016   FREET4 1.13 09/18/2015  08/01/2014: TSH 1.89  Pt is on levothyroxine 150 mcg daily, taken: - in am - fasting - at least 30 min from b'fast - no Ca, Fe, MVI, PPIs - + Biotin  No FH of thyroid cancer. No h/o radiation tx to head or neck.  No seaweed or kelp. No recent contrast studies. No herbal supplements. No Biotin use. No recent steroids use.   Pt  denies: - feeling nodules in neck - hoarseness - dysphagia - choking - SOB with lying down  Other labs reviewed prior records from PCP: 10/28/2019: Vitamin D 39.2  ROS: Constitutional: no weight gain/no weight loss, + fatigue, no subjective hyperthermia, no subjective hypothermia Eyes: no blurry vision, no xerophthalmia ENT: no sore throat, + see HPI Cardiovascular: no CP/no SOB/no palpitations/no leg swelling Respiratory: no cough/no SOB/no wheezing Gastrointestinal: no N/no V/no D/no C/no acid reflux Musculoskeletal: no muscle aches/no joint aches Skin: no rashes, no hair loss Neurological: no tremors/+ numbness/+ tingling/no dizziness  I reviewed pt's medications, allergies, PMH, social hx, family hx, and changes were documented in the history of present illness. Otherwise, unchanged from my initial visit note.  Past Medical History:  Diagnosis Date  . Anemia   . Arthritis   .  Chronic kidney disease   . Diabetes mellitus without complication ([place])   . Hypertension   . MRSA infection 2013   on leg; surgery   . Neuropathy due to secondary diabetes ([place])   . Thyroid disease    Past Surgical History:  Procedure Laterality Date  . BILATERAL CARPAL TUNNEL RELEASE Bilateral 2003  . CATARACT EXTRACTION W/ INTRAOCULAR LENS IMPLANT Left 2016  . CHOLECYSTECTOMY    . 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 EYE SURGERY    . KNEE ARTHROSCOPY Left 05/05/2016   Procedure: ARTHROSCOPY KNEE;  Surgeon: Thornton Park, MD;  Location: [facility] ORS;  Service: Orthopedics;  Laterality: Left;  . SHOULDER ARTHROSCOPY Left 2004  . THYROID SURGERY  2004  . TRIGGER FINGER RELEASE  2003   History   Social History Main Topics  . Smoking status: Former Research scientist (life sciences)  . Smokeless tobacco: Not on file  . Alcohol Use: No  . Drug Use: No   Social History Narrative   Single   0 children   [place]travel[place]      Beer and wine on occasion   First menstrual cycle: 6th grade    Postmenopausal      Current Outpatient Medications  Medication Sig Dispense Refill  . amLODipine (NORVASC) 10 MG tablet Take 10 mg by mouth daily. Reported on 11/06/2015    . hydrochlorothiazide (HYDRODIURIL) 25 MG tablet Take 25 mg by mouth daily.    . insulin NPH Human (HUMULIN N,NOVOLIN N) 100 UNIT/ML injection Inject 40 units in am and 30 units before dinner 20 mL 11  . insulin regular (NOVOLIN R RELION) 100 units/mL injection INJECT 10-35 UNITS SUB-Q 3 TIMES DAILY BEFORE MEALS. ReliOn insulin. 20 mL 11  . Insulin Syringe-Needle U-100 31G X 15/64" 0.5 ML MISC Use 3x a day 100 each 11  . levothyroxine (SYNTHROID, LEVOTHROID) 150 MCG tablet Take 1 tablet (150 mcg total) by mouth daily before breakfast. 90 tablet 3  . losartan (COZAAR) 100 MG tablet Take 100 mg by mouth daily.     Marland Kitchen lovastatin (MEVACOR) 20 MG tablet Take 20 mg by mouth at bedtime.     No current facility-administered medications for this visit.   NKDA   Family History  Problem Relation Age of Onset  . Hypertension Mother   . Thyroid disease Mother   . CVA Mother   . Hypertension Sister   . Thyroid disease Sister   . Hypertension Brother   . Hyperlipidemia Brother    PE: BP (!) 148/70   Pulse 90   Ht 5' 7.5" (1.715 m)   Wt 273 lb (123.8 kg)   SpO2 90%   BMI 42.13 kg/m  Body mass index is 42.13 kg/m. Wt Readings from Last 3 Encounters:  11/13/19 273 lb (123.8 kg)  05/13/19 276 lb (125.2 kg)  10/23/18 267 lb (121.1 kg)   Constitutional: overweight, in NAD Eyes: PERRLA, EOMI, no exophthalmos ENT: moist mucous membranes, no thyromegaly, no cervical lymphadenopathy Cardiovascular: RRR, No MRG Respiratory: CTA B Gastrointestinal: abdomen soft, NT, ND, BS+ Musculoskeletal: no deformities, strength intact in all 4 Skin: moist, warm, no rashes Neurological: no tremor with outstretched hands, DTR normal in all 4  ASSESSMENT: 1. DM2, insulin-dependent, uncontrolled, with complications - CKD - DR  2.  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 She did not  have RAI treatment at that time, however, she was found to have a thyroid mass in 2005. This was biopsied and returned as normal thyroid tissue.She had RAI treatment at that time. The posttreatment whole-body scan was negative for any cancers..   - Neck U/S (09/19/2014): 1. Post total thyroidectomy. 2. No change in the previously biopsied approximately 1.4 cm echogenic solid nodule within the inferior aspect of the right lobe of the thyroid, grossly unchanged since the 2005 examination. 3. Apparent development of an approximately 0.4 cm hypoechoic nodule within the right thyroidectomy bed - while too small for definitive characterization, this nodule appears to contain an echogenic hilum and thus is favored to represent a non pathologically enlarged cervical lymph node.  - Neck U/S (11/27/2015):  1. Surgical changes of prior total thyroidectomy. 2. Unchanged 1.4 cm echogenic soft tissue nodule in the right thyroid resection bed. Continued stability over time consistent with a benign process.  3. Post surgical hypothyroidism  PLAN:  1. Patient with longstanding, type 2 diabetes, on basal-bolus insulin regimen with NPH and regular insulin.  At last visit, HbA1c was 9.8% in 04/2019.  We had a phone visit 3 months ago so no HbA1c was checked at that time.  However, sugars were slightly higher than goal at that time but she was not checking the premeal sugars on an empty stomach.  She was snacking between meals and I advised her to reduce snacking especially after dinner or, if she continues to snack after dinner to cover these with regular insulin.  Otherwise, we did not change her regimen then. -At this visit, sugars are higher, but she occasionally has sugars better after close  to goal.  However, she does not check frequently so it is difficult to discern CBG trends.  Patient misses many meals and she does not take NPH and regular when she misses the meal.  We discussed that she will need to take the NPH even if she misses a meal, since this is the basal insulin.  Also, if she skips a meal but her sugars are high, she may need to take half of the regular insulin dose that she normally takes with a meal. -We will not make other changes in her regimen for now.  I strongly advised her again to cut out sweet drinks completely, not only to reduce them. - I advised her to: Patient Instructions   Please continue: Insulin Before breakfast Before lunch Before dinner  Regular 40 10 (if you eat) 40  NPH 40  30  Please take only 25 units of R insulin if sugars before meals <80. If sugars < or = 60 before meals, skip R insulin with that meal.  If you skip a meal, check the sugars and, if high, may need to tke 50% of the insulin dose anyway.  If you skip a meal, you still need to take the N insulin.  STOP SWEET DRINKS!  Please continue levothyroxine 150 mcg daily.  Take the thyroid hormone every day, with water, at least 30 minutes before breakfast, separated by at least 4 hours from: - acid reflux medications - calcium - iron - multivitamins  Please return in 3 months with your sugar log.   - today: HbA1c 12.6% (HIGHER!!!) - advised to check sugars at different times of the day - 3x a day, rotating check times - advised for yearly eye exams >> she is UTD - return to clinic in 3-4 months  2. 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  Latest thyroid ultrasound was reviewed (11/2015) and this showed a stable mass since 2018, in the thyroid ultrasound, with a benign process. -No neck compression symptoms -Her latest thyroglobulin level was still detectable, but lower than  before.  Overall, the levels have been fluctuating in the past. -We will repeat her thyroglobulin and ATA at next visit, approximately a year from the previous -We will recheck another thyroid ultrasound at next visit  3. Postsurgical hypothyroidism - latest thyroid labs reviewed with pt >> normal last month (see HPI)  - she continues on LT4 150 mcg daily - pt feels good on this dose. - we discussed about taking the thyroid hormone every day, with water, >30 minutes before breakfast, separated by >4 hours from acid reflux medications, calcium, iron, multivitamins. Pt. is taking it correctly.  [name], MD PhD [facility] Endocrinology

## 2019-11-13 NOTE — Patient Instructions (Addendum)
Please continue: Insulin Before breakfast Before lunch Before dinner  Regular 40 10 (if you eat) 40  NPH 40  30  Please take only 25 units of R insulin if sugars before meals <80. If sugars < or = 60 before meals, skip R insulin with that meal.  If you skip a meal, check the sugars and, if high, may need to tke 50% of the insulin dose anyway.  If you skip a meal, you still need to take the N insulin.  STOP SWEET DRINKS!  Please continue levothyroxine 150 mcg daily.  Take the thyroid hormone every day, with water, at least 30 minutes before breakfast, separated by at least 4 hours from: - acid reflux medications - calcium - iron - multivitamins  Please return in 3 months with your sugar log.

## 2019-11-15 DIAGNOSIS — E113511 Type 2 diabetes mellitus with proliferative diabetic retinopathy with macular edema, right eye: Secondary | ICD-10-CM | POA: Diagnosis not present

## 2019-11-27 DIAGNOSIS — N183 Chronic kidney disease, stage 3 unspecified: Secondary | ICD-10-CM | POA: Diagnosis not present

## 2019-11-29 DIAGNOSIS — E113511 Type 2 diabetes mellitus with proliferative diabetic retinopathy with macular edema, right eye: Secondary | ICD-10-CM | POA: Diagnosis not present

## 2020-01-13 DIAGNOSIS — E113511 Type 2 diabetes mellitus with proliferative diabetic retinopathy with macular edema, right eye: Secondary | ICD-10-CM | POA: Diagnosis not present

## 2020-02-11 ENCOUNTER — Telehealth: Payer: Self-pay | Admitting: Internal Medicine

## 2020-02-11 MED ORDER — LEVOTHYROXINE SODIUM 150 MCG PO TABS: 150.0000 ug | ORAL_TABLET | Freq: Every day | ORAL | 3 refills | Status: DC

## 2020-02-11 NOTE — Telephone Encounter (Signed)
RX sent

## 2020-02-11 NOTE — Telephone Encounter (Signed)
Medication Refill Request  . Did you call your pharmacy and request this refill first?  Yes . If patient has not contacted pharmacy first, instruct them to do so for future refills.  . Remind them that contacting the pharmacy for their refill is the quickest method to get the refill.  . Refill policy also stated that it will take anywhere between 24-72 hours to receive the refill.    Name of medication? levothyroxine  Is this a 90 day supply? unknown  Name and location of pharmacy? [facility] in [place] on [address]

## 2020-02-11 NOTE — Telephone Encounter (Signed)
I believe this is intended for you and Dr. Cruzita Lederer

## 2020-02-13 ENCOUNTER — Encounter: Payer: Self-pay | Admitting: Internal Medicine

## 2020-02-13 ENCOUNTER — Other Ambulatory Visit: Payer: Self-pay

## 2020-02-13 ENCOUNTER — Ambulatory Visit (INDEPENDENT_AMBULATORY_CARE_PROVIDER_SITE_OTHER): Payer: BC Managed Care – PPO | Admitting: Internal Medicine

## 2020-02-13 VITALS — BP 140/60 | HR 78 | Ht 67.5 in | Wt 274.0 lb

## 2020-02-13 DIAGNOSIS — E1165 Type 2 diabetes mellitus with hyperglycemia: Secondary | ICD-10-CM

## 2020-02-13 DIAGNOSIS — E890 Postprocedural hypothyroidism: Secondary | ICD-10-CM

## 2020-02-13 DIAGNOSIS — E1121 Type 2 diabetes mellitus with diabetic nephropathy: Secondary | ICD-10-CM

## 2020-02-13 DIAGNOSIS — Z794 Long term (current) use of insulin: Secondary | ICD-10-CM

## 2020-02-13 DIAGNOSIS — C73 Malignant neoplasm of thyroid gland: Secondary | ICD-10-CM

## 2020-02-13 DIAGNOSIS — IMO0002 Reserved for concepts with insufficient information to code with codable children: Secondary | ICD-10-CM

## 2020-02-13 LAB — T4, FREE: Free T4: 1.37 ng/dL (ref 0.60–1.60)

## 2020-02-13 LAB — TSH: TSH: 0.91 u[IU]/mL (ref 0.35–4.50)

## 2020-02-13 LAB — POCT GLYCOSYLATED HEMOGLOBIN (HGB A1C): Hemoglobin A1C: 10.7 % — AB (ref 4.0–5.6)

## 2020-02-13 MED ORDER — INSULIN REGULAR HUMAN 100 UNIT/ML IJ SOLN: INTRAMUSCULAR | 11 refills | Status: DC

## 2020-02-13 MED ORDER — TRESIBA FLEXTOUCH 200 UNIT/ML ~~LOC~~ SOPN: PEN_INJECTOR | SUBCUTANEOUS | 5 refills | Status: DC

## 2020-02-13 MED ORDER — OZEMPIC (0.25 OR 0.5 MG/DOSE) 2 MG/1.5ML ~~LOC~~ SOPN: 0.5000 mg | PEN_INJECTOR | SUBCUTANEOUS | 5 refills | Status: DC

## 2020-02-13 NOTE — Patient Instructions (Addendum)
Please change NPH to: - Tresiba 70 units in am  Change: - Regular insulin 30 units before breakfast and dinner  Please start: - Ozempic 0.25 mg weekly in a.m. (for example on Sunday morning) x 4 weeks, then increase to 0.5 mg weekly in a.m. if no nausea or hypoglycemia.  Please continue levothyroxine 150 mcg daily.  Take the thyroid hormone every day, with water, at least 30 minutes before breakfast, separated by at least 4 hours from: - acid reflux medications - calcium - iron - multivitamins  Please return in 3 months with your sugar log.

## 2020-02-13 NOTE — Progress Notes (Addendum)
Patient ID: ADANNA ZUCKERMAN, female   DOB: 09-14-1955, 64 y.o.   MRN: [id number]  This visit occurred during the SARS-CoV-2 public health emergency.  Safety protocols were in place, including screening questions prior to the visit, additional usage of staff PPE, and extensive cleaning of exam room while observing appropriate contact time as indicated for disinfecting solutions.   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 Last visit 3 months ago.  She had Covid19 08/2019: fever, chills, fatigue, lost taste.  She continues to have fatigue.  DM2: Reviewed HbA1c levels: Lab Results  Component Value Date   HGBA1C 12.6 (A) 11/13/2019   HGBA1C 9.8 (A) 05/13/2019   HGBA1C 13.7 (A) 10/23/2018  08/22/2016: HbA1c 11.8% 08/01/2014: HbA1c 14.5% 05/02/2014: HbA1c 14.8% 01/31/2014: HbA1c 13.6% 11/01/2013: HbA1c 15.1%  She was on: - Tresiba U200 70 units in am - ReliOn Novolin to 2-3x a day: - before breakfast and dinner:  20 units with a regular meal  25 units before a larger meal  (15 units before lunch if you eat lunch) - not using Stopped Metformin 500 mg 2x a day - b/c leg swelling. She was on Novolin 70/30 48 units 2x a day 15 min before the meals  She was on Amaryl 8 mg in am She was on Metformin >> stopped 2/2 CKD.  She is currently on: (May forget insulin doses) Insulin Before breakfast Before lunch Before dinner  Regular 40 10 (if you eat) 40  NPH 40  30  Please take only 25 units of R insulin if sugars before meals <80. If sugars < or = 60 before meals, skip R insulin with that meal. If you skip a meal, check the sugars and, if high, may need to tke 50% of the insulin dose anyway. If you skip a meal, you still need to take the N insulin.  She checks sugars 1-2 times a day  -sugars are very fluctuating: - am:  139-150s, 200s if eating late >> [phone] >> 76-325 - 2h after b'fast: n/c - before lunch: 75, 113, 165 >> 114, 192, 229 >> n/c - 2h after lunch: n/c >> 67, 228 >> n/c - before dinner: 155-180 >> 120-145 >> 150-160 >> 140-150 >> 70-271 - 2h after dinner: n/c >> 131, 269 >> n/c - bedtime: 81-160 >> n/c >> 240s >> n/c >> 287 >> n/c - nighttime: n/c  Lowest sugar was 81 >> 139 >> 98 >> 70; it is unclear at which level she has hypoglycemia awareness. Highest sugar was 287 (cake) >> 251 >> 325.  Glucometer: Prodigy >> ReliOn  Pt's meals are: - Breakfast: chicken  - Lunch: peanuts, sandwich + crystal lite drink - Dinner: frozen dinner - Snacks: fruit, small bag potato chips, peanuts She was drinking sweet tea and sodas in the past and I strongly advised her to stop.  -+ Stage 3 CKD; BUN/creatinine:  10/28/2019: Glucose 96, BUN/creatinine 12/1.47, GFR 43 Lab Results  Component Value Date   BUN 21 06/25/2018   Lab Results  Component Value Date   CREATININE 1.44 (H) 06/25/2018  08/22/2016: BUN/creatinine 20/1.41 03/18/2016: BUN/creatinine 16/1.21, EGFR 57, Calcium 8.7, AST/ALT 17/24 09/18/2015: BUN/creatinine 14/1.2, GFR 57 (previously 55) 08/01/2014: 15/1.26, GFR 55 She was taken off Cozaar due to cough.  -+ HL; lipids: 10/28/2019: 166/88/91/60 Lab Results  Component Value Date  CHOL 134 06/25/2018   HDL 70.20 06/25/2018   LDLCALC 54 06/25/2018   TRIG 47.0 06/25/2018   CHOLHDL 2 06/25/2018  03/18/2016: Lipids: 156/85/70/69 09/18/2015: 160/57/87/62 08/01/2014: 145/78/74/55 On lovastatin 20.  - last eye exam was on  05/2019: + DR; she had laser surgeries and intraocular injections -before last visit she restarted IO inj for macular edema.  She had left eye cataract surgery.  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  - She did not have RAI treatment at that time, however, she was found to have a thyroid mass in 2005 >> This was biopsied and returned as normal thyroid tissue, but had RAI treatment at that time.  - The posttreatment whole-body scan was negative for any cancer spread.   Thyroglobulin returned at 2.3 in 08/2014 >> I ordered a neck ultrasound that showed a stable nodule with a fatty hilum, consistent with a benign lymph node, but no other masses in the surgical bed.   The thyroglobulin level remained detectable, and was actually 9.9 in 10/2015.   A neck ultrasound (11/2015) showed a stable 1.4 nodule in the thyroid bed, consistent with a benign process   Her thyroglobulin level continues to remain detectable.  Reviewed pertinent labs: Lab Results  Component Value Date   THYROGLB 2.1 (L) 05/13/2019   THYROGLB 2.5 (L) 06/25/2018   THYROGLB 1.8 (L) 08/15/2017   THYROGLB 2.5 (L) 08/29/2016   THYROGLB 9.9 11/06/2015   THYROGLB 2.4 (L) 09/18/2015   THYROGLB 0.8 (L) 12/26/2014   THYROGLB 2.3 (L) 08/22/2014   THGAB <1 05/13/2019   THGAB <1 06/25/2018   THGAB <1 08/15/2017   THGAB <1 08/29/2016   THGAB <1 09/18/2015   THGAB <1.0 12/26/2014   THGAB <1 08/22/2014   THGAB <1.0 08/22/2014   Postsurgical hypothyroidism:  Reviewed her TFTs: 10/28/2019: TSH 1.6 Lab Results  Component Value Date   TSH 0.89 05/13/2019   TSH 1.39 06/25/2018   TSH 0.61 08/15/2017   TSH 1.82 08/29/2016   TSH 1.03 09/18/2015   FREET4 1.24 05/13/2019   FREET4 0.99 06/25/2018   FREET4 1.04 08/15/2017   FREET4 1.30 08/29/2016   FREET4 1.13 09/18/2015  08/01/2014: TSH 1.89  Pt is on levothyroxine 150 mcg daily, taken: - in am - fasting - at least 30 min from b'fast - no Ca, Fe, MVI, PPIs - + on Biotin  No FH of thyroid cancer. No h/o radiation tx to head or  neck other than RAI tx.  No seaweed or kelp. No recent contrast studies. No herbal supplements. No Biotin use. No recent steroids use.   Pt denies: - feeling nodules in neck - hoarseness - dysphagia - choking - SOB with lying down  Other labs reviewed prior records from PCP: 10/28/2019: Vitamin D 39.2  No h/o pancreatitis.  ROS: Constitutional: no weight gain/no weight loss, + fatigue, no subjective hyperthermia, no subjective hypothermia Eyes: no blurry vision, no xerophthalmia ENT: no sore throat, + see HPI Cardiovascular: no CP/no SOB/no palpitations/no leg swelling Respiratory: no cough/no SOB/no wheezing Gastrointestinal: no N/no V/no D/no C/no acid reflux Musculoskeletal: no muscle aches/no joint aches Skin: no rashes, no hair loss Neurological: no tremors/+ numbness/+ tingling/no dizziness  I reviewed pt's medications, allergies, PMH, social hx,  family hx, and changes were documented in the history of present illness. Otherwise, unchanged from my initial visit note.  Past Medical History:  Diagnosis Date  . Anemia   . Arthritis   . Chronic kidney disease   . Diabetes mellitus without complication ([place])   . Hypertension   . MRSA infection 2013   on leg; surgery   . Neuropathy due to secondary diabetes ([place])   . Thyroid disease    Past Surgical History:  Procedure Laterality Date  . BILATERAL CARPAL TUNNEL RELEASE Bilateral 2003  . CATARACT EXTRACTION W/ INTRAOCULAR LENS IMPLANT Left 2016  . CHOLECYSTECTOMY    . 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 EYE SURGERY    . KNEE ARTHROSCOPY Left 05/05/2016   Procedure: ARTHROSCOPY KNEE;  Surgeon: Thornton Park, MD;  Location: [facility] ORS;  Service: Orthopedics;  Laterality: Left;  . SHOULDER ARTHROSCOPY Left 2004  . THYROID SURGERY  2004  . TRIGGER FINGER RELEASE  2003   History   Social History Main Topics  . Smoking status: Former Research scientist (life sciences)  . Smokeless tobacco: Not on file  . Alcohol Use: No  . Drug Use: No    Social History Narrative   Single   0 children   [place]travels regularly)      Beer and wine on occasion   First menstrual cycle: 6th grade   Postmenopausal      Current Outpatient Medications  Medication Sig Dispense Refill  . amLODipine (NORVASC) 10 MG tablet Take 10 mg by mouth daily. Reported on 11/06/2015    . hydrochlorothiazide (HYDRODIURIL) 25 MG tablet Take 25 mg by mouth daily.    . insulin NPH Human (HUMULIN N,NOVOLIN N) 100 UNIT/ML injection Inject 40 units in am and 30 units before dinner 20 mL 11  . insulin regular (NOVOLIN R RELION) 100 units/mL injection INJECT 10-35 UNITS SUB-Q 3 TIMES DAILY BEFORE MEALS. ReliOn insulin. 20 mL 11  . Insulin Syringe-Needle U-100 31G X 15/64" 0.5 ML MISC Use 3x a day 100 each 11  . levothyroxine (SYNTHROID) 150 MCG tablet Take 1 tablet (150 mcg total) by mouth daily before breakfast. 90 tablet 3  . losartan (COZAAR) 100 MG tablet Take 100 mg by mouth daily.     Marland Kitchen lovastatin (MEVACOR) 20 MG tablet Take 20 mg by mouth at bedtime.     No current facility-administered medications for this visit.   NKDA   Family History  Problem Relation Age of Onset  . Hypertension Mother   . Thyroid disease Mother   . CVA Mother   . Hypertension Sister   . Thyroid disease Sister   . Hypertension Brother   . Hyperlipidemia Brother    PE: BP 140/60   Pulse 78   Ht 5' 7.5" (1.715 m)   Wt 274 lb (124.3 kg)   SpO2 99%   BMI 42.28 kg/m  Body mass index is 42.28 kg/m. Wt Readings from Last 3 Encounters:  02/13/20 274 lb (124.3 kg)  11/13/19 273 lb (123.8 kg)  05/13/19 276 lb (125.2 kg)   Constitutional: overweight, in NAD Eyes: PERRLA, EOMI, no exophthalmos ENT: moist mucous membranes, no neck masses palpated, no cervical lymphadenopathy Cardiovascular: RRR, No MRG Respiratory: CTA B Gastrointestinal: abdomen soft, NT, ND, BS+ Musculoskeletal: no deformities, strength intact in all 4 Skin: moist, warm, no  rashes Neurological: no tremor with outstretched hands, DTR normal in all 4  ASSESSMENT: 1. DM2, insulin-dependent, uncontrolled, with complications - CKD - DR  2.  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 She did not have RAI treatment at that time, however, she was found to have a thyroid mass in 2005. This was biopsied and returned as normal thyroid tissue.She had RAI treatment at that time. The posttreatment whole-body scan was negative for any cancers..   - Neck U/S (09/19/2014): 1. Post total thyroidectomy. 2. No change in the previously biopsied approximately 1.4 cm echogenic solid nodule within the inferior aspect of the right lobe of the thyroid, grossly unchanged since the 2005 examination. 3. Apparent development of an approximately 0.4 cm hypoechoic nodule within the right thyroidectomy bed - while too small for definitive characterization, this nodule appears to contain an echogenic hilum and thus is favored to represent a non pathologically enlarged cervical lymph node.  - Neck U/S (11/27/2015):  1. Surgical changes of prior total thyroidectomy. 2. Unchanged 1.4 cm echogenic soft tissue nodule in the right thyroid resection bed. Continued stability over time consistent with a benign process.  3. Post surgical hypothyroidism  PLAN:  1. Patient with longstanding, uncontrolled, type 2 diabetes, on basal-bolus insulin regimen with NPH and regular insulin. -At last visit, sugars were higher with occasional sugars at or close to goal.  However, she was not checking frequently so it was difficult to discern CBG trends.  She was also missing many meals and not taking NPH and regular insulin whenever she did not eat.  We discussed that she still needs to take her NPH but not a regular insulin, unless the sugars are  high in which case she needs to take half of her regular insulin even if she is not eating.  At that time, HbA1c was very high, at 12.6%.  I also advised her to stop sweet drinks completely. -At this visit, sugars are extremely fluctuating and she is telling me that she is forgetting doses of both regular and NPH.  She is particularly prone to forget insulin doses at night.  We discussed about possibly changing back to [place], now that she has [employer].  She agrees to do so.  I advised her to take [place] in the morning and if she forgets, to take it later in the day, when she remembers.  We will continue the regular insulin for now, but we will decrease the doses with breakfast and dinner, to avoid low blood sugars.  I am suspecting that whenever she has high blood sugars in the 300s, she may have forgotten the insulin.  Also, since she does not have a family history of medullary thyroid cancer a personal history of pancreatitis, we will try to start a GLP-1 receptor agonist.  We will start at a low dose and increase as tolerated.  Discussed about benefits and possible side effects. - I advised her to: Patient Instructions  Please change NPH to: - Tresiba 70 units in am  Change: - Regular insulin 30 units before breakfast and dinner  Please start: - Ozempic 0.25 mg weekly in a.m. (for example on Sunday morning) x 4 weeks, then increase to 0.5 mg weekly in a.m. if no nausea or hypoglycemia.  Please continue levothyroxine 150 mcg daily.  Take the thyroid hormone every day, with water, at least 30 minutes before breakfast, separated by at least 4 hours from: - acid reflux medications - calcium - iron - multivitamins  Please return in 3 months with your sugar log.   - we checked her HbA1c: 10.7% (better) - advised to check sugars at different times of the day - 3x a day, rotating check times - advised for yearly eye exams >> she is UTD - return to clinic in 3 months        2.  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  Review latest ultrasound report from 11/2015 and this showed a stable mass since 2018, consistent with a benign process. -She denies neck compression symptoms -Latest thyroglobulin level was reviewed and is was still detectable, but lower than before.  Overall, levels have been fluctuating in the past. -We will repeat her thyroglobulin and ATA -We will also check another neck ultrasound   3. Postsurgical hypothyroidism - latest thyroid labs reviewed with pt >> normal in 10/2019  - she continues on LT4 150 mcg daily - pt feels good on this dose. - we discussed about taking the thyroid hormone every day, with water, >30 minutes before breakfast, separated by >4 hours from acid reflux medications, calcium, iron, multivitamins. Pt. is taking it correctly. - will check thyroid tests today: TSH and fT4 - If labs are abnormal, she will need to return for repeat TFTs in 1.5 months  Orders Placed This Encounter  Procedures  . US THYROID  . TSH  . T4, free  . Thyroglobulin antibody  . Thyroglobulin Level   Component     Latest Ref Rng & Units 02/13/2020  Thyroglobulin     ng/mL 3.3  TSH     0.35 - 4.50 uIU/mL 0.91  T4,Free(Direct)     0.60 - 1.60 ng/dL 1.37  Thyroglobulin Ab     < or = 1 IU/mL <1   Thyroid function tests are normal, however, thyroglobulin is slightly higher. We will await the results of her ultrasound.  Narrative & Impression CLINICAL DATA:  Thyroid cancer, remote thyroidectomy  EXAM: THYROID ULTRASOUND  TECHNIQUE: Ultrasound examination of the thyroid gland and adjacent soft tissues was performed.  COMPARISON:  11/27/2015  FINDINGS: Previous total thyroidectomy. No residual thyroid tissue or thyroid bed abnormality. No regional  adenopathy.  ________________________________________________________________  IMPRESSION: Stable surgical changes following total thyroidectomy. No residual thyroid bed abnormality.      Exam Ended: 03/02/20 15:14 Last Resulted: 03/02/20 16:26        Thyroid ultrasound does not show any recurrence of the thyroid cancer metastases in the neck.  Since the thyroglobulin is still detectable, I would suggest a Thyrogen stimulated whole-body scan.  WBS (03/27/2020): CLINICAL DATA:  Papillary thyroid carcinoma with thyroidectomy in 2004. Adjuvant I 131 therapy with 779 millicuries 3903.  EXAM: THYROGEN-STIMULATED I-131 WHOLE BODY SCAN  TECHNIQUE: The patient received 0.9 mg Thyrogen intramuscularly every 24 hours for two doses. On the third day the patient returned and received the radiopharmaceutical, per orally. On the fifth day, the patient returned and whole body planar images were obtained in the anterior and posterior projections.  RADIOPHARMACEUTICALS:  3.9 mCi I-131 sodium iodide orally  COMPARISON:  Post I 131 therapy scan 2005.  FINDINGS: No I 131 activity within the thyroid bed. No evidence of visceral metastasis or skeletal metastasis on whole-body scan.  Physiologic activity noted within the salivary glands and stomach.  IMPRESSION: No evidence of local thyroid cancer recurrence or distant metastasis by I 131 imaging.   Electronically Signed   By: Helane Gunther.D.  On: 03/27/2020 15:32  Clear WBS. Will repeat Tg at next visit and if increasing, will need a PET scan.  [name], MD PhD [facility] Endocrin[facility]

## 2020-02-14 LAB — THYROGLOBULIN LEVEL: Thyroglobulin: 3.3 ng/mL

## 2020-02-14 LAB — THYROGLOBULIN ANTIBODY: Thyroglobulin Ab: <1 IU/mL (ref <=1)

## 2020-02-17 ENCOUNTER — Telehealth: Payer: Self-pay | Admitting: Internal Medicine

## 2020-02-17 MED ORDER — OZEMPIC (0.25 OR 0.5 MG/DOSE) 2 MG/1.5ML ~~LOC~~ SOPN: 0.5000 mg | PEN_INJECTOR | SUBCUTANEOUS | 5 refills | Status: DC

## 2020-02-17 NOTE — Telephone Encounter (Signed)
No we have not received anything from pharmacy stating this.  I have just sent in the PA and it has been approved.

## 2020-02-17 NOTE — Telephone Encounter (Signed)
Patient called re: patient's insurance does not cover Ozempic. [facility] told patient that they sent Dr. Cruzita Lederer a notice re: the above and that the [facility] is waiting on Dr. Cruzita Lederer for an approval (patient does not know what alternative medications are covered by insurance)

## 2020-02-18 NOTE — Telephone Encounter (Signed)
Please advise 

## 2020-02-18 NOTE — Telephone Encounter (Signed)
Patient says even with the approval from insurance her copay would be $309 even with the coupon - she would like to possibly switch back to Novolin N if possible. Ph# [phone]

## 2020-02-18 NOTE — Telephone Encounter (Signed)
M,  I understand that Ozempic is not affordable, but this is not interchangeable with NPH...  Does she mean that Tyler Aas is also not affordable and she wants to go back to NPH? This is the regimen that I gave her at last visit:   Please change NPH to: - Tresiba 70 units in am   Change: - Regular insulin 30 units before breakfast and dinner   Please start: - Ozempic 0.25 mg weekly in a.m. (for example on Sunday morning) x 4 weeks, then increase to 0.5 mg weekly in a.m. if no nausea or hypoglycemia.

## 2020-02-19 NOTE — Telephone Encounter (Signed)
M, can she apply for pt asstnce? Or contact [facility] for help  - I have the nr in the office if you do not have it. In the meantine, increase Tresiba to 80 units and R insulin to 35-40 units and stop any sweet drinks or other sweets other than fruit for now.

## 2020-02-19 NOTE — Telephone Encounter (Signed)
Clarified with patient, she has the [place] at [employer] [employer] copy, it is the Ozempic she can not afford.  Patient states CBG running [phone].

## 2020-02-20 ENCOUNTER — Other Ambulatory Visit: Payer: Self-pay | Admitting: Internal Medicine

## 2020-02-20 NOTE — Telephone Encounter (Signed)
I put a referral in for [facility] help with Ozempic, etc.

## 2020-02-20 NOTE — Telephone Encounter (Signed)
From what I understand [facility] should be a resource for her. I do not know the process for them though.

## 2020-03-02 ENCOUNTER — Ambulatory Visit
Admission: RE | Admit: 2020-03-02 | Discharge: 2020-03-02 | Disposition: A | Discharge status: Home / Self Care | Payer: BC Managed Care – PPO | Source: Ambulatory Visit | Attending: Internal Medicine | Admitting: Internal Medicine

## 2020-03-02 DIAGNOSIS — C73 Malignant neoplasm of thyroid gland: Secondary | ICD-10-CM

## 2020-03-04 NOTE — Addendum Note (Signed)
Addended by: [name] on: 03/04/2020 01:52 PM   Modules accepted: Orders

## 2020-03-06 ENCOUNTER — Other Ambulatory Visit: Payer: Self-pay | Admitting: *Deleted

## 2020-03-06 NOTE — Patient Outreach (Signed)
[facility]) Care Management  03/06/2020  Courtney [name] 09-29-1955 [id number]   [facility] Telephone Assessment/Screen for [name] referral Reassigned to This [facility] RN CM on 03/05/20 Courtney [name]) Referral Date:02/20/20 Referral Source: [name] referral  Referral Reason: 02/20/2020   Courtney [name]: [name] referral                                   Reason for consult      DM2 - cannot afford GLP1 R agonist  Diagnoses of   Diabetes            Insurance: blue cross and blue shield blue Advantage PPO Rx 4D    Outreach attempt # 1 successful to the listed home number  Patient is able to verify HIPAA, DOB and address Reviewed and addressed referral to [facility] with patient  Courtney [name] confirms medication assistance needs See below noted under medications  Courtney [name] clarifies that ALL mail needs to be sent to the post office box address versus the physical address so she may receive the items  Courtney [name] agrees to further [place] and [facility] RN CM services With assessment she denies need for [facility] Denies concerns with food insecurity, transportation , housing, etc  Social: Courtney [name] is a 64 year old retired patient ([employer] -traveled regularly) with the support of her family She is independent with her care needs and transportation    Conditions: uncontrolled type 2 Diabetes Mellitus with nephrology with long term use of insulin, Hypertension (HTN), Chronic Kidney disease (CKD) stage 3, 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 Patient unable to afford meds- Ozempic  0.375 mg once a week  Pt paid for a June prescription ($309 to get it after a discount card) after she was given a $25 discount card  Also on Tresiba 70 units qd but with discount card she is paying $25 per month and lasts until October-November 2021 pcp [facility] PA-C seen with Courtney [name], Courtney [name] - last seen march 2021  Insurance: blue cross and blue shield blue Advantage PPO Rx 4D Major medical conditions- uncontrolled type 2 Diabetes Mellitus wit nephrology with long term use of insulin, Hypertension (HTN), Chronic Kidney disease (CKD) stage 3 thyroid cancer, post surgical hypothyroidism, hyperlipidemia various abscesses  With medication review she confirms she is no longer taking Losartan and HCTZ but is on Torsemide   She has obtained the covid vaccine but has never taken the flu vaccine   Appointments: 03/23/20,  03/24/20, 03/25/20, 03/27/20 [place] radiology  05/19/20 Dr Courtney [name]  Advance Directives: Denies need for assist with advance directives   Consent: [facility] RN CM reviewed [facility] services with patient. Patient gave verbal consent for services [facility] telephonic RN [place] pharmacy.   Plan: [facility] RN CM will follow up with Courtney Mires within the next 14-21 business days  Pt encouraged to return  a call to [facility] RN CM prn Routed note to [name] [facility] RN CM sent a Welcome outreach letter with [facility] brochure, [place], [facility] consent form with return envelope and know before you go sheet enclosed for review  [name] involvement barriers letters sent pcp, Courtney [name] and endocrinology, Dr Courtney [name]  [facility] CM Care Plan Problem One     Most Recent Value  Care Plan Problem One home management of Diabetes with cost concern for Diabetes medications  Role Documenting the Problem One Care  Management Telephonic Coordinator  Care Plan for Problem One Active  [facility] Long Term Goal  over the next 90 days patient will reports better home management of diabetes after medication assistance during outreach contacts  [place] Term Goal Start Date 03/06/20  Interventions for Problem One Long Term Goal Reviewed [name] referral, [facility] screening completed, [facility] pharmacy referral for assist with ozempic/possible Tyler Aas, answered questions  THN CM Short Term Goal #1  over the next 45 days patient will receive assistance with Ozempic and tresiba prn as evidence bby verbalization during further outreach calls  [facility] CM Short Term Goal #1 Start Date 03/06/20  Interventions for Short Term Goal #1 Assessed medication assistance needs, provider of medications, answered questions, discussed [facility] pharmacy services, answered questions Discussed further [facility] RN CM assessment and services      Courtney [name] Courtney. Lavina Hamman, RN, BSN, [place] Coordinator Office number [phone] Mobile number [phone]  Main THN number [phone] Fax number [phone]

## 2020-03-09 ENCOUNTER — Encounter: Payer: Self-pay | Admitting: *Deleted

## 2020-03-09 DIAGNOSIS — L02214 Cutaneous abscess of groin: Secondary | ICD-10-CM | POA: Insufficient documentation

## 2020-03-09 DIAGNOSIS — N764 Abscess of vulva: Secondary | ICD-10-CM | POA: Insufficient documentation

## 2020-03-09 DIAGNOSIS — L97909 Non-pressure chronic ulcer of unspecified part of unspecified lower leg with unspecified severity: Secondary | ICD-10-CM | POA: Insufficient documentation

## 2020-03-09 DIAGNOSIS — I10 Essential (primary) hypertension: Secondary | ICD-10-CM | POA: Insufficient documentation

## 2020-03-09 DIAGNOSIS — L02419 Cutaneous abscess of limb, unspecified: Secondary | ICD-10-CM | POA: Insufficient documentation

## 2020-03-09 DIAGNOSIS — L0211 Cutaneous abscess of neck: Secondary | ICD-10-CM | POA: Insufficient documentation

## 2020-03-09 DIAGNOSIS — L03211 Cellulitis of face: Secondary | ICD-10-CM | POA: Insufficient documentation

## 2020-03-09 DIAGNOSIS — E782 Mixed hyperlipidemia: Secondary | ICD-10-CM | POA: Insufficient documentation

## 2020-03-09 DIAGNOSIS — N2889 Other specified disorders of kidney and ureter: Secondary | ICD-10-CM | POA: Insufficient documentation

## 2020-03-09 DIAGNOSIS — M79643 Pain in unspecified hand: Secondary | ICD-10-CM | POA: Insufficient documentation

## 2020-03-09 DIAGNOSIS — D649 Anemia, unspecified: Secondary | ICD-10-CM | POA: Insufficient documentation

## 2020-03-09 DIAGNOSIS — Z1211 Encounter for screening for malignant neoplasm of colon: Secondary | ICD-10-CM | POA: Insufficient documentation

## 2020-03-09 DIAGNOSIS — N1832 Chronic kidney disease, stage 3b: Secondary | ICD-10-CM | POA: Insufficient documentation

## 2020-03-09 NOTE — Patient Outreach (Signed)
Referral from Jackelyn Poling to [place] for Medication Assistance.

## 2020-03-20 ENCOUNTER — Ambulatory Visit: Payer: Self-pay | Admitting: *Deleted

## 2020-03-23 ENCOUNTER — Ambulatory Visit (HOSPITAL_COMMUNITY)
Admission: RE | Admit: 2020-03-23 | Discharge: 2020-03-23 | Disposition: A | Discharge status: Home / Self Care | Payer: BC Managed Care – PPO | Source: Ambulatory Visit | Attending: Internal Medicine | Admitting: Internal Medicine

## 2020-03-23 ENCOUNTER — Other Ambulatory Visit: Payer: Self-pay

## 2020-03-23 DIAGNOSIS — C73 Malignant neoplasm of thyroid gland: Secondary | ICD-10-CM | POA: Insufficient documentation

## 2020-03-23 MED ORDER — THYROTROPIN ALFA 1.1 MG IM SOLR
INTRAMUSCULAR | Status: AC
  Filled 2020-03-23: qty 0.9

## 2020-03-23 MED ORDER — THYROTROPIN ALFA 1.1 MG IM SOLR
0.9000 mg | INTRAMUSCULAR | Status: AC
  Administered 2020-03-23: 0.9 mg via INTRAMUSCULAR

## 2020-03-24 ENCOUNTER — Ambulatory Visit (HOSPITAL_COMMUNITY)
Admission: RE | Admit: 2020-03-24 | Discharge: 2020-03-24 | Disposition: A | Discharge status: Home / Self Care | Payer: BC Managed Care – PPO | Source: Ambulatory Visit | Attending: Internal Medicine | Admitting: Internal Medicine

## 2020-03-24 DIAGNOSIS — C73 Malignant neoplasm of thyroid gland: Secondary | ICD-10-CM | POA: Diagnosis not present

## 2020-03-24 MED ORDER — THYROTROPIN ALFA 1.1 MG IM SOLR
INTRAMUSCULAR | Status: AC
  Filled 2020-03-24: qty 0.9

## 2020-03-24 NOTE — Discharge Instructions (Signed)
03/24/2020    Procedure- NM Whole Body I131 Scan W/Thyrogen   The sedative medications given during your procedure may have effects lasting up to 24 hours.  You may have some drowsiness, dizziness, difficulty with walking, making decisions and memory problems.  No activities should be taken that require your best effort during the next 24 hours.  For your safety it is important that you understand and follow these instructions.  1. Do not drive any form of motorized equipment such as cars, trucks, yard tractors, golf carts, motorcycles and mopeds. 2. Do not drink alcohol. 3. Do not operate machinery or equipment such as stove, lawnmower and chainsaw. 4. Do not make any important decisions such as signing any legal documents. 5. You may become dizzy, especially when changing positions.  Move slowly and take your time.  Avoid stairs if possible. 6. Resume your normal diet when you feel ready to eat.  If you feel queasy, drink small amounts of uncarbonated clear liquids such as tea, orange or apple juice, clear broth or Gatorade to avoid getting dehydrated.  Then progress to bland soup, toast or crackers, then resume your normal diet again.     These instructions have been explained and understood by me or by a responsible person.  I have received a copy to take home with me.  I have a responsible adult to drive me home and stay with me when I return home.  All of my questions have been answered for me or for a responsible person.

## 2020-03-25 ENCOUNTER — Other Ambulatory Visit: Payer: Self-pay

## 2020-03-25 ENCOUNTER — Encounter (HOSPITAL_COMMUNITY)
Admission: RE | Admit: 2020-03-25 | Discharge: 2020-03-25 | Disposition: A | Discharge status: Home / Self Care | Payer: BC Managed Care – PPO | Source: Ambulatory Visit | Attending: Internal Medicine | Admitting: Internal Medicine

## 2020-03-25 DIAGNOSIS — C73 Malignant neoplasm of thyroid gland: Secondary | ICD-10-CM | POA: Insufficient documentation

## 2020-03-25 MED ORDER — SODIUM IODIDE I 131 CAPSULE
3.9000 | Freq: Once | INTRAVENOUS | Status: AC
  Administered 2020-03-25: 3.9 via ORAL

## 2020-03-27 ENCOUNTER — Other Ambulatory Visit: Payer: Self-pay

## 2020-03-27 ENCOUNTER — Ambulatory Visit (HOSPITAL_COMMUNITY)
Admission: RE | Admit: 2020-03-27 | Discharge: 2020-03-27 | Disposition: A | Discharge status: Home / Self Care | Payer: BC Managed Care – PPO | Source: Ambulatory Visit | Attending: Internal Medicine | Admitting: Internal Medicine

## 2020-03-27 DIAGNOSIS — Z85850 Personal history of malignant neoplasm of thyroid: Secondary | ICD-10-CM | POA: Diagnosis not present

## 2020-03-31 ENCOUNTER — Ambulatory Visit: Payer: Self-pay | Admitting: *Deleted

## 2020-03-31 MED ORDER — THYROTROPIN ALFA 1.1 MG IM SOLR
0.9000 mg | INTRAMUSCULAR | Status: AC
  Administered 2020-03-24: 0.9 mg via INTRAMUSCULAR

## 2020-03-31 MED ORDER — THYROTROPIN ALFA 1.1 MG IM SOLR: 0.9000 mg | INTRAMUSCULAR | Status: AC

## 2020-04-10 ENCOUNTER — Other Ambulatory Visit: Payer: Self-pay

## 2020-04-10 ENCOUNTER — Other Ambulatory Visit: Payer: Self-pay | Admitting: *Deleted

## 2020-04-10 NOTE — Patient Outreach (Signed)
[place] [facility]) Care Management  04/10/2020  LISSANDRA KEIL 18-Jul-1956 [id number]   [facility] Telephone Assessment/Screen for MD referral Reassigned to This [facility] RN CM on 03/05/20 [name] Referral Date:02/20/20 Referral Source: MD referral  Referral Reason: 02/20/2020 [name], MD: MD referral  Reason for consultDM2 - cannot afford GLP1 R agonist Diagnoses ofDiabetes  Insurance: blue cross and blue shield blue Advantage PPO Rx 4 D    Outreach attempt  successful to the listed home number  Patient is able to verify HIPAA, DOB and address Reviewed and addressed referral to [facility] patient  Ms Kalka confirms medication assistance needs have been resolved  She received a call from [facility] central pharmacy staff Abbey Chatters on 03/13/20  Abbey Chatters had discussed with [facility] RN CM that Mrs Linarez had commercial insurance, blue cross and blue shield and did not qualify for medication assistance. She was able to get [place] for $25 and Ozempic for $0. Mrs Deshmukh reports she got a recent refill and was able to afford it at $40 She is aware that she has the availability to call the [facility] staff or [facility] RN CM if the prices elevates  She continues to deny need for [facility] Denies concerns with food insecurity, transportation , housing, etc  *She does report she has had issues with a headache  She reports as far as she can determine is correlates with her taking her night time insulin.  As evidence she has at intervals not had to take insulin when the cbg value has been below 100 and on those mornings she does not have a headache She confirms today she had a headache all day long  Her cbg value this am was 171   She is to try adjusting her night insulin and obtain a blood pressure (BP) cuff via her blue cross and blue shield over the counter (OTC) program card to start monitoring at home  She and [facility] RN CM discussed diabetic  headaches related to either a low or high cbg  She reports she has noted the morning headache with both a low and with a high cbg value especially if she had taken the night 30 unit insulin dose  She confirms she drinks water regularly She also has a history of covid infection and at times continues to have decreased energy [facility] RN CM inquired if she had spoken to a medical provider about covid long hauler syndrome  She states she will speak with her provider   Social: 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 She is independent with her care needs and transportation    Conditions: uncontrolled type 2 Diabetes Mellitus with nephrology with long term use of insulin, Hypertension (HTN), Chronic Kidney disease (CKD) stage 3, 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 , post surgical hypothyroidism, postmenopausal bleeding (PMB),endometrial cells on cervical pap smear inconsistent with LMP, abscess of groin, thigh, neck, vulva, leg ulcer, Hyperlipidemia (HLD), Kidney lump depression, anemia, 2013 MRSA infection, 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 Patient gave verbal consent for services [facility] telephonic RN [place] pharmacy.   Plan: [facility] RN CM will follow up with Mrs Dorgan within the next 14-21 business days  Pt encouraged to return a call to [facility] RN CM prn Routed note to MD  [place]. Lavina Hamman, RN, BSN,  [place] Coordinator Office number [phone] Mobile number [phone]  Main THN number [phone] Fax number [phone]

## 2020-04-15 DIAGNOSIS — E113511 Type 2 diabetes mellitus with proliferative diabetic retinopathy with macular edema, right eye: Secondary | ICD-10-CM | POA: Diagnosis not present

## 2020-04-24 ENCOUNTER — Other Ambulatory Visit: Payer: Self-pay | Admitting: *Deleted

## 2020-04-24 NOTE — Patient Outreach (Signed)
[place] [facility]) Care Management  04/24/2020  Courtney [name] [birth year] [id number]   Unsuccessful THN follow up Reita May  Reassigned to This [facility] RN CM on 03/05/20 [name] Referral Date:02/20/20 Referral Source:MD referral Referral Reason:02/20/2020 [name], MD: MD referral  Reason for consultDM2 - cannot afford GLP1 R agonist Diagnoses ofDiabetes  Insurance:blue cross and blue shieldblue Advantage PPO Rx 4 D   Unsuccessful outreach  No answer. THN RN CM left HIPAA [facility] Portability and Accountability Act) compliant voicemail message along with CM's contact info.   Plan: [facility] RN CM scheduled this THN engaged patient for another call attempt within 7-10 business days  Teosha Casso L. Lavina Hamman, RN, BSN, [place] Coordinator Office number [phone] Mobile number [phone]  Main THN number [phone] Fax number [phone]

## 2020-04-24 NOTE — Patient Outreach (Signed)
[facility]) Care Management  04/24/2020  Courtney [name] August 05, 1956 [id number]   [facility] Telephone Assessment/Screen for MD referral Reassigned to This [facility] RN CM on 03/05/20 [name] Referral Date:02/20/20 Referral Source:MD referral Referral Reason:02/20/2020 [name], MD: MD referral  Reason for consultDM 2 - cannot afford GLP1 R agonist Diagnoses ofDiabetes  Insurance:blue cross and blue shieldblue Advantage PPO Rx 4 D   Patient is able to verify HIPAA ([place]) identifiers, date of birth (DOB) and address Reviewed and addressed the purpose of the follow up call with the patient  Consent: [facility] RN CM reviewed [facility] services with patient. Patient gave verbal consent for services.  Medication financial concern [name] discussed with [facility] RN that she noted she went over her budget with the purchasing of Tyler Aas this month. She states she is noted a $60 extra amount in her budget. She reports balancing her check book and "went over" She is inquiring of another possible DM treatment medication that may assisst  She confirms the purchase of Tresbia on 04/23/20 and having enough of her Ozempic for about 3-4 weeks more   THN RN CM interventions Sent an e mail to [place] and left a voice message with attempt to conference with patient about her concern today  Call to [place] [phone] spoke with [name] to update related to Ms Tennison concerns with headache related to insulin [facility] RN CM sent an Epic message to Dr Wardell Heath (noted out of office until after 04/27/20) making her aware of the decrease in the hs insulin resolving the recent night headaches the patient was experiencing plus her concern with the cost of Tresiba and [place] monthly  [place] referral ordered  Headache after hs insulin She reports  her headaches reported on 04/10/20 have been resolved. She decreased her hs insulin to 25 units and she has not been awaken with a headache or woke up with a headache since. 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 She is on a fixed income She is independent with her care needs and transportation She has a large family and good support system she stays active with her home remodeling (kitchen) and family events (reunion)    Conditions: uncontrolled type 2 Diabetes Mellitus with nephrology with long term use of insulin, Hypertension (HTN), Chronic Kidney disease (CKD) stage 3, 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 , post surgical hypothyroidism, postmenopausal bleeding (PMB),endometrial cells on cervical pap smear inconsistent with LMP, abscess of groin, thigh, neck, vulva, leg ulcer, Hyperlipidemia (HLD), Kidney lump depression, anemia, 2013 MRSA infection, 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  Patient will be able to manage Diabetes at home (pt-stated)        [place] (see longtitudinal plan of care for additional care plan information)  Objective:  Lab Results  Component Value  Date   HGBA1C 10.7 (A) 02/13/2020 .   Lab Results  Component Value Date   CREATININE 1.44 (H) 06/25/2018   CREATININE 1.29 (H) 04/27/2016   CREATININE 1.44 (H) 07/15/2012 .   Marland Kitchen No results found for: EGFR  Current Barriers:  Marland Kitchen Knowledge Deficits related to basic Diabetes pathophysiology and self care/management . Difficulty obtaining or cannot afford medications- Ozempic and Tresiba . Cognitive Deficits  Case Manager Clinical Goal(s):  Over the next 45 days, patient will demonstrate improved adherence to prescribed treatment plan for diabetes self care/management as evidenced by:  . over the next 90 days patient will reports better home management of diabetes after medication assistance during outreach contacts . over the next 30 days patient will receive assistance with Ozempic and tresiba prn as evidence bby verbalization during further outreach calls  Interventions:  . Provided education to patient about basic DM disease process . Reviewed medications with patient and discussed importance of medication adherence . Discussed plans with patient for ongoing care management follow up and provided patient with direct contact information for care management team . Reviewed scheduled/upcoming provider appointments including: pcp and endocrinology . Referral to pharmacy   Patient Self Care Activities:  . Self administers oral medications as prescribed . Self administers insulin as prescribed . Attends all scheduled provider appointments . Checks blood sugars as prescribed and utilize hyper and hypoglycemia protocol as needed . Adheres to prescribed ADA/carb modified  Initial goal documentation Please see other previous [facility] care plan information listed in Epic under the flow sheet section         Chrisha Vogel L. Lavina Hamman, RN, BSN, [place] Coordinator Office number [phone] Mobile number [phone]  Main THN number [phone] Fax number  [phone]

## 2020-04-27 ENCOUNTER — Other Ambulatory Visit: Payer: Self-pay | Admitting: *Deleted

## 2020-04-27 ENCOUNTER — Encounter: Payer: Self-pay | Admitting: *Deleted

## 2020-04-27 DIAGNOSIS — E785 Hyperlipidemia, unspecified: Secondary | ICD-10-CM | POA: Diagnosis not present

## 2020-04-27 DIAGNOSIS — IMO0002 Reserved for concepts with insufficient information to code with codable children: Secondary | ICD-10-CM | POA: Insufficient documentation

## 2020-04-27 DIAGNOSIS — Z0000 Encounter for general adult medical examination without abnormal findings: Secondary | ICD-10-CM | POA: Diagnosis not present

## 2020-04-27 DIAGNOSIS — E1165 Type 2 diabetes mellitus with hyperglycemia: Secondary | ICD-10-CM | POA: Diagnosis not present

## 2020-04-27 DIAGNOSIS — E559 Vitamin D deficiency, unspecified: Secondary | ICD-10-CM | POA: Diagnosis not present

## 2020-04-27 DIAGNOSIS — R809 Proteinuria, unspecified: Secondary | ICD-10-CM | POA: Insufficient documentation

## 2020-04-27 NOTE — Patient Outreach (Signed)
Referral from Jackelyn Poling, RN to [place] for Medication Assistance.

## 2020-04-27 NOTE — Patient Outreach (Signed)
[place] [facility]) Care Management  04/27/2020  Courtney [name] Jul 19, 1956 [id number]   [place] coordination- Collaboration with patient pcp office   [facility] RN CM received voice message from nurse, Olin Hauser at Janifer Adie, NP (PCP orfice)  Referred THN RN CM to Ms [facility] office and requesting a return prn    Plan [facility] RN CM will follow up with Ms Masih within the next 14-21 business days  [place] L. Lavina Hamman, RN, BSN, [place] Coordinator Office number [phone] Mobile number [phone]  Main THN number [phone] Fax number [phone]

## 2020-04-28 ENCOUNTER — Other Ambulatory Visit: Payer: Self-pay

## 2020-04-28 ENCOUNTER — Other Ambulatory Visit: Payer: Self-pay | Admitting: *Deleted

## 2020-04-28 ENCOUNTER — Telehealth: Payer: Self-pay | Admitting: Internal Medicine

## 2020-04-28 NOTE — Patient Outreach (Signed)
[facility] [facility]) Care Management  04/28/2020  DAIL MEECE Sep 21, 1955 [id number]   [place] coordination-collaboration with [place] staff  E mail collaboration about voiced pt concerns and needs related to [place] and ozempic    Plan [facility] RN CM will follow up with Ms Samad within the next 14-21 business days  [place] L. Lavina Hamman, RN, BSN, [place] Coordinator Office number [phone] Mobile number [phone]  Main THN number [phone] Fax number [phone]

## 2020-04-28 NOTE — Telephone Encounter (Signed)
M, Please advise her that Novolin R is not interchangeable with Novolin N.  If she does not get Novolin R from the pharmacy (although she may try another [facility]) we may need to give her some Humalog or any other rapid acting insulin samples.  She should take the same dose as Novolin R, but only 15 rather than 30 minutes before meals.

## 2020-04-28 NOTE — Telephone Encounter (Signed)
Patient called stating her pharmacy does not have any Novolin R until possibly tomorrow - patient asking if we could call in Novolin N until she can get the R and if so, how many units could she take? Ph# [phone] (patient says she is out and is requesting as soon as possible)

## 2020-04-29 LAB — CYTOLOGY - PAP: Pap: NEGATIVE

## 2020-05-01 ENCOUNTER — Encounter: Payer: Self-pay | Admitting: Internal Medicine

## 2020-05-01 ENCOUNTER — Other Ambulatory Visit: Payer: Self-pay | Admitting: *Deleted

## 2020-05-01 NOTE — Progress Notes (Signed)
Received labs from PCP, drawn on 04/27/2020: Lipids 130/121/69/40 CMP normal with the exception of glucose 136, BUN/creatinine 22/2.18, GFR 27, calcium 8.6 (8.7-10.3), alkaline phosphatase 145 (48-121),  ALT 33 (0-32) Vitamin D 23.5  In PCPs note, it was mentioned that she was lost for follow-up with nephrology and she was referred back to [facility].

## 2020-05-01 NOTE — Patient Outreach (Signed)
[place] [facility]) Care Management  05/01/2020  Courtney [name] [birth year] [id number]   Unsuccessful THN follow up outeach  Reassigned to This [facility] RN CM on 03/05/20 Courtney [name]) Referral Date:02/20/20 Referral Source:MD referral Referral Reason:02/20/2020 Courtney Kingdom, MD: MD referral  Reason for consultDM2 - cannot afford GLP1 R agonist Diagnoses ofDiabetes  Insurance:blue cross and blue shieldblue Advantage PPO Rx4 D   Unsuccessful outreach  No answer. THN RN CM left HIPAA [facility] Portability and Accountability Act) compliant voicemail message along with CM's contact info.   Plan: [facility] RN CM scheduled this THN engaged patient for another call attempt within 7-10 business days  Courtney Larue L. Lavina Hamman, RN, BSN, [place] Coordinator Office number [phone] Mobile number [phone]  Main THN number [phone] Fax number [phone]

## 2020-05-04 ENCOUNTER — Telehealth (INDEPENDENT_AMBULATORY_CARE_PROVIDER_SITE_OTHER): Payer: Self-pay | Admitting: Gastroenterology

## 2020-05-04 ENCOUNTER — Other Ambulatory Visit: Payer: Self-pay

## 2020-05-04 DIAGNOSIS — Z1211 Encounter for screening for malignant neoplasm of colon: Secondary | ICD-10-CM

## 2020-05-04 MED ORDER — PEG 3350-KCL-NA BICARB-NACL 420 G PO SOLR: 4000.0000 mL | Freq: Once | ORAL | 0 refills | Status: AC

## 2020-05-04 NOTE — Progress Notes (Signed)
  Gastroenterology Pre-Procedure Review  Request Date: Thursday 05/14/20 Requesting Physician: Dr. Marius Ditch  PATIENT REVIEW QUESTIONS: The patient responded to the following health history questions as indicated:    1. Are you having any GI issues? no 2. Do you have a personal history of Polyps? no 3. Do you have a family history of Colon Cancer or Polyps? no 4. Diabetes Mellitus? yes (type 2) 5. Joint replacements in the past 12 months?no 6. Major health problems in the past 3 months?no 7. Any artificial heart valves, MVP, or defibrillator?no    MEDICATIONS & ALLERGIES:    Patient reports the following regarding taking any anticoagulation/antiplatelet therapy:   Plavix, Coumadin, Eliquis, Xarelto, Lovenox, Pradaxa, Brilinta, or Effient? no Aspirin? no  Patient confirms/reports the following medications:  Current Outpatient Medications  Medication Sig Dispense Refill  . amLODipine (NORVASC) 10 MG tablet Take 10 mg by mouth daily. Reported on 11/06/2015    . ergocalciferol (VITAMIN D2) 1.25 MG (50000 UT) capsule Vitamin D2 1,250 mcg (50,000 unit) capsule    . insulin degludec (TRESIBA FLEXTOUCH) 200 UNIT/ML FlexTouch Pen Inject under skin 70 units daily 3 pen 5  . insulin regular (NOVOLIN R RELION) 100 units/mL injection INJECT 30 UNITS SUB-Q 3 TIMES DAILY BEFORE MEALS. ReliOn insulin. 20 mL 11  . Insulin Syringe-Needle U-100 31G X 15/64" 0.5 ML MISC Use 3x a day 100 each 11  . levothyroxine (SYNTHROID) 150 MCG tablet Take 1 tablet (150 mcg total) by mouth daily before breakfast. 90 tablet 3  . losartan (COZAAR) 100 MG tablet Take 100 mg by mouth daily.     . Semaglutide,0.25 or 0.5MG /DOS, (OZEMPIC, 0.25 OR 0.5 MG/DOSE,) 2 MG/1.5ML SOPN Inject 0.375 mLs (0.5 mg total) into the skin once a week. 2 pen 5  . triamterene-hydrochlorothiazide (DYAZIDE) 37.5-25 MG capsule Take 1 capsule by mouth every morning.    . hydrochlorothiazide (HYDRODIURIL) 25 MG tablet Take 25 mg by mouth daily.   (Patient not taking: Reported on 05/04/2020)    . lovastatin (MEVACOR) 20 MG tablet Take 20 mg by mouth at bedtime. (Patient not taking: Reported on 05/04/2020)     No current facility-administered medications for this visit.    Patient confirms/reports the following allergies:  Allergies  Allergen Reactions  . Lisinopril Cough  . Meloxicam Other (See Comments)    Joint pain    No orders of the defined types were placed in this encounter.   AUTHORIZATION INFORMATION Primary Insurance: 1D#: Group #:  Secondary Insurance: 1D#: Group #:  SCHEDULE INFORMATION: Date: Thursday 05/14/20 Time: Location:[facility]

## 2020-05-05 ENCOUNTER — Other Ambulatory Visit: Payer: Self-pay | Admitting: *Deleted

## 2020-05-05 NOTE — Patient Outreach (Signed)
Courtney [name]) Care Management  05/05/2020  Courtney [name] [birth year] [id number]   [facility] Telephone Assessment/Screen for MD referral Reassigned to This [facility] RN CM on 03/05/20 Courtney [name]) Referral Date:02/20/20 Referral Source: MD referral  Referral Reason: 02/20/2020 [name], MD: MD referral  Reason for consultDM2 - cannot afford GLP1 R agonist Diagnoses ofDiabetes  Insurance: blue cross and blue shield blue Advantage PPO Rx 4D ( affordable care plan)    Outreach successful to her own number  Patient is able to verify HIPAA, DOB and address She states she did get a call from [facility] central pharmacy staff today 05/05/20  Reviewed with her the outreach from Dr Cruzita Lederer indicating that her insulins should be free  Questions answered today about free dental clinics and places to take her extra glucose meters Also discussed the importance of verifying in network providers and procedures with her insurance agency prior to agreeing to them as she reports financially she is responsible for a balance from a body scan  Social: Courtney [name] is a 64 year old retired patient ([employer] -traveled regularly) with the support of her family She is independent with her care needs and transportation    Conditions: uncontrolled type 2 Diabetes Mellitus with nephrology with long term use of insulin, Hypertension (HTN), Chronic Kidney disease (CKD) stage 3, 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 Lavina Hamman, RN, BSN, [place] Coordinator Office number [phone] Main [facility] number [phone] Fax number [phone]

## 2020-05-08 ENCOUNTER — Ambulatory Visit: Payer: Self-pay | Admitting: *Deleted

## 2020-05-12 ENCOUNTER — Other Ambulatory Visit
Admission: RE | Admit: 2020-05-12 | Discharge: 2020-05-12 | Disposition: A | Discharge status: Home / Self Care | Payer: BC Managed Care – PPO | Source: Ambulatory Visit | Attending: Gastroenterology | Admitting: Gastroenterology

## 2020-05-12 ENCOUNTER — Other Ambulatory Visit: Payer: Self-pay

## 2020-05-12 DIAGNOSIS — Z20822 Contact with and (suspected) exposure to covid-19: Secondary | ICD-10-CM | POA: Diagnosis not present

## 2020-05-12 DIAGNOSIS — Z01812 Encounter for preprocedural laboratory examination: Secondary | ICD-10-CM | POA: Diagnosis not present

## 2020-05-12 LAB — SARS CORONAVIRUS 2 (TAT 6-24 HRS): SARS Coronavirus 2: NEGATIVE

## 2020-05-13 ENCOUNTER — Encounter: Payer: Self-pay | Admitting: Gastroenterology

## 2020-05-13 MED ORDER — SODIUM CHLORIDE 0.9 % IV SOLN: INTRAVENOUS | Status: DC

## 2020-05-14 ENCOUNTER — Ambulatory Visit: Payer: BC Managed Care – PPO | Admitting: Certified Registered Nurse Anesthetist

## 2020-05-14 ENCOUNTER — Other Ambulatory Visit: Payer: Self-pay

## 2020-05-14 ENCOUNTER — Encounter
Admission: RE | Disposition: A | Discharge status: Home / Self Care | Payer: Self-pay | Source: Home / Self Care | Attending: Gastroenterology

## 2020-05-14 ENCOUNTER — Encounter: Payer: Self-pay | Admitting: General Practice

## 2020-05-14 ENCOUNTER — Encounter: Payer: Self-pay | Admitting: Gastroenterology

## 2020-05-14 ENCOUNTER — Ambulatory Visit
Admission: RE | Admit: 2020-05-14 | Discharge: 2020-05-14 | Disposition: A | Discharge status: Home / Self Care | Payer: BC Managed Care – PPO | Attending: Gastroenterology | Admitting: Gastroenterology

## 2020-05-14 DIAGNOSIS — K635 Polyp of colon: Secondary | ICD-10-CM

## 2020-05-14 DIAGNOSIS — I129 Hypertensive chronic kidney disease with stage 1 through stage 4 chronic kidney disease, or unspecified chronic kidney disease: Secondary | ICD-10-CM | POA: Insufficient documentation

## 2020-05-14 DIAGNOSIS — D124 Benign neoplasm of descending colon: Secondary | ICD-10-CM | POA: Diagnosis not present

## 2020-05-14 DIAGNOSIS — Z8349 Family history of other endocrine, nutritional and metabolic diseases: Secondary | ICD-10-CM | POA: Insufficient documentation

## 2020-05-14 DIAGNOSIS — E1122 Type 2 diabetes mellitus with diabetic chronic kidney disease: Secondary | ICD-10-CM | POA: Insufficient documentation

## 2020-05-14 DIAGNOSIS — Z79899 Other long term (current) drug therapy: Secondary | ICD-10-CM | POA: Insufficient documentation

## 2020-05-14 DIAGNOSIS — Z87891 Personal history of nicotine dependence: Secondary | ICD-10-CM | POA: Insufficient documentation

## 2020-05-14 DIAGNOSIS — Z79890 Hormone replacement therapy (postmenopausal): Secondary | ICD-10-CM | POA: Diagnosis not present

## 2020-05-14 DIAGNOSIS — D123 Benign neoplasm of transverse colon: Secondary | ICD-10-CM | POA: Diagnosis not present

## 2020-05-14 DIAGNOSIS — Z1211 Encounter for screening for malignant neoplasm of colon: Secondary | ICD-10-CM | POA: Diagnosis not present

## 2020-05-14 DIAGNOSIS — E890 Postprocedural hypothyroidism: Secondary | ICD-10-CM | POA: Diagnosis not present

## 2020-05-14 DIAGNOSIS — E785 Hyperlipidemia, unspecified: Secondary | ICD-10-CM | POA: Diagnosis not present

## 2020-05-14 DIAGNOSIS — E1140 Type 2 diabetes mellitus with diabetic neuropathy, unspecified: Secondary | ICD-10-CM | POA: Insufficient documentation

## 2020-05-14 DIAGNOSIS — Z9049 Acquired absence of other specified parts of digestive tract: Secondary | ICD-10-CM | POA: Diagnosis not present

## 2020-05-14 DIAGNOSIS — D120 Benign neoplasm of cecum: Secondary | ICD-10-CM | POA: Diagnosis not present

## 2020-05-14 DIAGNOSIS — M1990 Unspecified osteoarthritis, unspecified site: Secondary | ICD-10-CM | POA: Diagnosis not present

## 2020-05-14 DIAGNOSIS — Z947 Corneal transplant status: Secondary | ICD-10-CM | POA: Insufficient documentation

## 2020-05-14 DIAGNOSIS — Z794 Long term (current) use of insulin: Secondary | ICD-10-CM | POA: Insufficient documentation

## 2020-05-14 DIAGNOSIS — N189 Chronic kidney disease, unspecified: Secondary | ICD-10-CM | POA: Diagnosis not present

## 2020-05-14 DIAGNOSIS — Z8614 Personal history of Methicillin resistant Staphylococcus aureus infection: Secondary | ICD-10-CM | POA: Insufficient documentation

## 2020-05-14 DIAGNOSIS — Z8249 Family history of ischemic heart disease and other diseases of the circulatory system: Secondary | ICD-10-CM | POA: Diagnosis not present

## 2020-05-14 DIAGNOSIS — K644 Residual hemorrhoidal skin tags: Secondary | ICD-10-CM | POA: Diagnosis not present

## 2020-05-14 HISTORY — PX: COLONOSCOPY WITH PROPOFOL: SHX5780

## 2020-05-14 LAB — GLUCOSE, CAPILLARY: Glucose-Capillary: 75 mg/dL (ref 70–99)

## 2020-05-14 SURGERY — COLONOSCOPY WITH PROPOFOL
Anesthesia: General

## 2020-05-14 MED ORDER — PROPOFOL 10 MG/ML IV BOLUS
INTRAVENOUS | Status: DC | PRN
  Administered 2020-05-14: 125 ug/kg/min via INTRAVENOUS
  Administered 2020-05-14: 50 mg via INTRAVENOUS

## 2020-05-14 MED ORDER — PHENYLEPHRINE HCL (PRESSORS) 10 MG/ML IV SOLN
INTRAVENOUS | Status: DC | PRN
  Administered 2020-05-14 (×7): 100 ug via INTRAVENOUS

## 2020-05-14 MED ORDER — SODIUM CHLORIDE 0.9 % IV SOLN: INTRAVENOUS | Status: DC

## 2020-05-14 MED ORDER — LIDOCAINE HCL (CARDIAC) PF 100 MG/5ML IV SOSY
PREFILLED_SYRINGE | INTRAVENOUS | Status: DC | PRN
  Administered 2020-05-14: 50 mg via INTRAVENOUS

## 2020-05-14 NOTE — Op Note (Signed)
[facility] Gastroenterology Patient Name: Courtney [name] Procedure Date: 05/14/2020 9:02 AM MRN: [id number] Account #: [id number] Date of Birth: 1956-03-28 Admit Type: Outpatient Age: 64 Room: [facility] ENDO ROOM 1 Gender: Female Note Status: Finalized Procedure:             Colonoscopy Indications:           Screening for colorectal malignant neoplasm, This is                         the patient's first colonoscopy Providers:             Lin Landsman MD, MD Referring MD:          Philmore Pali (Referring MD) Medicines:             Monitored Anesthesia Care Complications:         No immediate complications. Estimated blood loss: None. Procedure:             Pre-Anesthesia Assessment:                        - Prior to the procedure, a History and Physical was                         performed, and patient medications and allergies were                         reviewed. The patient is competent. The risks and                         benefits of the procedure and the sedation options and                         risks were discussed with the patient. All questions                         were answered and informed consent was obtained.                         Patient identification and proposed procedure were                         verified by the physician, the nurse, the                         anesthesiologist, the anesthetist and the technician                         in the pre-procedure area in the procedure room in the                         endoscopy suite. Mental Status Examination: alert and                         oriented. Airway Examination: normal oropharyngeal                         airway and neck mobility. Respiratory Examination:  clear to auscultation. CV Examination: normal.                         Prophylactic Antibiotics: The patient does not require                         prophylactic antibiotics. Prior Anticoagulants:  The                         patient has taken no previous anticoagulant or                         antiplatelet agents. ASA Grade Assessment: III - A                         patient with severe systemic disease. After reviewing                         the risks and benefits, the patient was deemed in                         satisfactory condition to undergo the procedure. The                         anesthesia plan was to use monitored anesthesia care                         (MAC). Immediately prior to administration of                         medications, the patient was re-assessed for adequacy                         to receive sedatives. The heart rate, respiratory                         rate, oxygen saturations, blood pressure, adequacy of                         pulmonary ventilation, and response to care were                         monitored throughout the procedure. The physical                         status of the patient was re-assessed after the                         procedure.                        After obtaining informed consent, the colonoscope was                         passed under direct vision. Throughout the procedure,                         the patient's blood pressure, pulse, and oxygen  saturations were monitored continuously. The                         Colonoscope was introduced through the anus and                         advanced to the the cecum, identified by appendiceal                         orifice and ileocecal valve. The colonoscopy was                         unusually difficult due to poor bowel prep,                         significant looping and the patient's body habitus.                         Successful completion of the procedure was aided by                         applying abdominal pressure and lavage. The patient                         tolerated the procedure well. The quality of the bowel                          preparation was evaluated using the BBPS [place] Bowel                         Preparation Scale) with scores of: Right Colon = 2                         (minor amount of residual staining, small fragments of                         stool and/or opaque liquid, but mucosa seen well),                         Transverse Colon = 2 (minor amount of residual                         staining, small fragments of stool and/or opaque                         liquid, but mucosa seen well) and Left Colon = 2                         (minor amount of residual staining, small fragments of                         stool and/or opaque liquid, but mucosa seen well). The                         total BBPS score equals 6. Findings:      The perianal and digital rectal examinations were normal. Pertinent       negatives  include normal sphincter tone and no palpable rectal lesions.      A 8 mm polyp was found in the cecum. The polyp was semi-pedunculated.       The polyp was removed with a hot snare. Resection and retrieval were       complete. To prevent bleeding after the polypectomy, one hemostatic clip       was successfully placed (MR conditional). There was no bleeding during,       or at the end, of the procedure.      A 5 mm polyp was found in the cecum. The polyp was sessile. The polyp       was removed with a cold snare. Resection and retrieval were complete.      A 50 mm polyp was found in the transverse colon. The polyp was       semi-pedunculated. The polyp was removed with a hot snare. Resection and       retrieval with roth net were complete . To prevent bleeding after the       polypectomy, two hemostatic clips were successfully placed (MR       conditional). There was no bleeding during, or at the end, of the       procedure.      A 9 mm polyp was found in the transverse colon. The polyp was sessile.       The polyp was removed with a hot snare. Resection and retrieval were       complete.       A 10 mm polyp was found in the descending colon. The polyp was sessile.       The polyp was removed with a hot snare. Resection and retrieval were       complete. To prevent bleeding after the polypectomy, one hemostatic clip       was successfully placed (MR conditional). There was no bleeding during,       or at the end, of the procedure.      A 25 mm polyp was found in the descending colon. The polyp was       pedunculated. The polyp was removed with a hot snare. Resection and       retrieval were complete. To prevent bleeding after the polypectomy, one       hemostatic clip was successfully placed (MR conditional). There was no       bleeding during, or at the end, of the procedure.      A 25 mm polyp was found in the descending colon. The polyp was       pedunculated. The polyp was removed with a hot snare. Resection and       retrieval were complete. To prevent bleeding after the polypectomy, two       hemostatic clips were successfully placed (MR conditional). There was no       bleeding during, or at the end, of the procedure.      Non-bleeding external hemorrhoids were found during endoscopy. The       hemorrhoids were large. Impression:            - One 8 mm polyp in the cecum, removed with a hot                         snare. Resected and retrieved. Clip (MR conditional)  was placed.                        - One 5 mm polyp in the cecum, removed with a cold                         snare. Resected and retrieved.                        - One 50 mm polyp in the transverse colon, removed                         with a hot snare. Resected and retrieved. Clips (MR                         conditional) were placed.                        - One 9 mm polyp in the transverse colon, removed with                         a hot snare. Resected and retrieved.                        - One 10 mm polyp in the descending colon, removed                         with a hot  snare. Resected and retrieved. Clip (MR                         conditional) was placed.                        - One 25 mm polyp in the descending colon, removed                         with a hot snare. Resected and retrieved. Clip (MR                         conditional) was placed.                        - One 25 mm polyp in the descending colon, removed                         with a hot snare. Resected and retrieved. Clips (MR                         conditional) were placed.                        - Non-bleeding external hemorrhoids. Recommendation:        - Discharge patient to home (with escort).                        - Clear liquid diet today.                        - Continue present medications.                        -  Await pathology results.                        - Repeat colonoscopy in 6 months with 2 day prep                         because the bowel preparation was poor and for                         surveillance of multiple polyps. Procedure Code(s):     --- Professional ---                        [phone], Colonoscopy, flexible; with removal of                         tumor(s), polyp(s), or other lesion(s) by snare                         technique Diagnosis Code(s):     --- Professional ---                        Z12.11, Encounter for screening for malignant neoplasm                         of colon                        K63.5, Polyp of colon                        K64.4, Residual hemorrhoidal skin tags CPT copyright 2019 American Medical Association. All rights reserved. The codes documented in this report are preliminary and upon coder review may  be revised to meet current compliance requirements. Dr. Ulyess Mort Lin Landsman MD, MD 05/14/2020 10:19:31 AM This report has been signed electronically. Number of Addenda: 0 Note Initiated On: 05/14/2020 9:02 AM Scope Withdrawal Time: 0 hours 50 minutes 3 seconds  Total Procedure Duration: 0 hours 57 minutes 9  seconds  Estimated Blood Loss:  Estimated blood loss: none.      [facility]

## 2020-05-14 NOTE — H&P (Signed)
Cephas Darby, MD [address]  [place]  [place], [place] [address]  Main: [phone]  Fax: [phone] Pager: [phone]  Primary Care Physician:  Philmore Pali, NP Primary Gastroenterologist:  Dr. Cephas Darby  Pre-Procedure History & Physical: HPI:  Courtney [name] is a 64 y.o. female is here for an colonoscopy.   Past Medical History:  Diagnosis Date  . Anemia   . Arthritis   . Chronic kidney disease   . Diabetes mellitus without complication ([place])   . Hyperlipidemia   . Hypertension   . MRSA infection 2013   on leg; surgery   . Neuropathy due to secondary diabetes ([place])   . Thyroid disease     Past Surgical History:  Procedure Laterality Date  . BILATERAL CARPAL TUNNEL RELEASE Bilateral 2003  . CATARACT EXTRACTION W/ INTRAOCULAR LENS IMPLANT Left 2016  . CHOLECYSTECTOMY    . 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 EYE SURGERY    . KNEE ARTHROSCOPY Left 05/05/2016   Procedure: ARTHROSCOPY KNEE;  Surgeon: Thornton Park, MD;  Location: [facility] ORS;  Service: Orthopedics;  Laterality: Left;  . SHOULDER ARTHROSCOPY Left 2004  . THYROID SURGERY  2004  . TRIGGER FINGER RELEASE  2003    Prior to Admission medications   Medication Sig Start Date End Date Taking? Authorizing Provider  amLODipine (NORVASC) 10 MG tablet Take 10 mg by mouth daily. Reported on 11/06/2015 08/01/14  Yes [provider]  hydrochlorothiazide (HYDRODIURIL) 25 MG tablet Take 25 mg by mouth daily.    Yes [provider]  insulin degludec (TRESIBA FLEXTOUCH) 200 UNIT/ML FlexTouch Pen Inject under skin 70 units daily 02/13/20  Yes [name], MD  insulin regular (NOVOLIN R RELION) 100 units/mL injection INJECT 30 UNITS SUB-Q 3 TIMES DAILY BEFORE MEALS. ReliOn insulin. 02/13/20  Yes [name], MD  Insulin Syringe-Needle U-100 31G X 15/64" 0.5 ML MISC Use 3x a day 10/23/18  Yes [name], MD  levothyroxine (SYNTHROID) 150 MCG tablet Take 1 tablet (150 mcg total)  by mouth daily before breakfast. 02/11/20  Yes [name], MD  losartan (COZAAR) 100 MG tablet Take 100 mg by mouth daily.    Yes [provider]  lovastatin (MEVACOR) 20 MG tablet Take 20 mg by mouth at bedtime.    Yes [provider]  Semaglutide,0.25 or 0.5MG /DOS, (OZEMPIC, 0.25 OR 0.5 MG/DOSE,) 2 MG/1.5ML SOPN Inject 0.375 mLs (0.5 mg total) into the skin once a week. 02/17/20  Yes [name], MD  triamterene-hydrochlorothiazide (DYAZIDE) 37.5-25 MG capsule Take 1 capsule by mouth every morning. 03/20/20  Yes [provider]  ergocalciferol (VITAMIN D2) 1.25 MG (50000 UT) capsule Vitamin D2 1,250 mcg (50,000 unit) capsule    [provider]    Allergies as of 05/04/2020 - Review Complete 05/04/2020  Allergen Reaction Noted  . Lisinopril Cough 12/26/2014  . Meloxicam Other (See Comments) 06/19/2015    Family History  Problem Relation Age of Onset  . Hypertension Mother   . Thyroid disease Mother   . CVA Mother   . Hypertension Sister   . Thyroid disease Sister   . Hypertension Brother   . Hyperlipidemia Brother     Social History   Socioeconomic History  . Marital status: Single    Spouse name: Not on file  . Number of children: 0  . Years of education: Not on file  . Highest education level: Not on file  Occupational History  . Occupation: retired  Tobacco Use  . Smoking status: Former Smoker    Types: Cigarettes  . Smokeless tobacco: Never Used  Vaping Use  . Vaping Use: Never used  Substance and Sexual Activity  . Alcohol use: Yes    Alcohol/week: 0.0 standard drinks    Comment: occassional  . Drug use: No  . Sexual activity: Not on file  Other Topics Concern  . Not on file  Social History Narrative   Single   0 children   Merchandise sales (travels regularly)      Beer and wine on occasion   First menstrual cycle: 6th grade   Postmenopausal   Social Determinants of Radio broadcast assistant Strain:   .  Difficulty of Paying Living Expenses: Not on file  Food Insecurity: No Food Insecurity  . Worried About Charity fundraiser in the Last Year: Never true  . Ran Out of Food in the Last Year: Never true  Transportation Needs: No Transportation Needs  . Lack of Transportation (Medical): No  . Lack of Transportation (Non-Medical): No  Physical Activity:   . Days of Exercise per Week: Not on file  . Minutes of Exercise per Session: Not on file  Stress:   . Feeling of Stress : Not on file  Social Connections:   . Frequency of Communication with Friends and Family: Not on file  . Frequency of Social Gatherings with Friends and Family: Not on file  . Attends Religious Services: Not on file  . Active Member of Clubs or Organizations: Not on file  . Attends Archivist Meetings: Not on file  . Marital Status: Not on file  Intimate Partner Violence:   . Fear of Current or Ex-Partner: Not on file  . Emotionally Abused: Not on file  . Physically Abused: Not on file  . Sexually Abused: Not on file    Review of Systems: See HPI, otherwise negative ROS  Physical Exam: BP 137/76   Pulse 85   Temp (!) 96.9 F (36.1 C) (Temporal)   Resp 18   Ht 5\' 8"  (1.727 m)   Wt 119.7 kg   SpO2 100%   BMI 40.14 kg/m  General:   Alert,  pleasant and cooperative in NAD Head:  Normocephalic and atraumatic. Neck:  Supple; no masses or thyromegaly. Lungs:  Clear throughout to auscultation.    Heart:  Regular rate and rhythm. Abdomen:  Soft, nontender and nondistended. Normal bowel sounds, without guarding, and without rebound.   Neurologic:  Alert and  oriented x4;  grossly normal neurologically.  Impression/Plan: Courtney [name] is here for an colonoscopy to be performed for colon cancer screening  Risks, benefits, limitations, and alternatives regarding  colonoscopy have been reviewed with the patient.  Questions have been answered.  All parties agreeable.   Sherri Sear, MD  05/14/2020, 9:05  AM

## 2020-05-14 NOTE — Anesthesia Preprocedure Evaluation (Signed)
Anesthesia Evaluation  Patient identified by MRN, date of birth, ID band Patient awake    Reviewed: Allergy & Precautions, NPO status , Patient's Chart, lab work & pertinent test results  History of Anesthesia Complications Negative for: history of anesthetic complications  Airway Mallampati: III  TM Distance: >3 FB Neck ROM: Full    Dental no notable dental hx. (+) Teeth Intact   Pulmonary neg sleep apnea, neg COPD, former smoker,    breath sounds clear to auscultation- rhonchi (-) wheezing      Cardiovascular Exercise Tolerance: Good hypertension, Pt. on medications (-) CAD and (-) Past MI  Rhythm:Regular Rate:Normal - Systolic murmurs and - Diastolic murmurs    Neuro/Psych negative neurological ROS  negative psych ROS   GI/Hepatic negative GI ROS, Neg liver ROS,   Endo/Other  diabetes, Type 2, Insulin DependentHypothyroidism Morbid obesity  Renal/GU CRFRenal disease     Musculoskeletal  (+) Arthritis , Osteoarthritis,    Abdominal (+) + obese,   Peds  Hematology  (+) anemia ,   Anesthesia Other Findings Past Medical History: No date: Anemia No date: Arthritis No date: Chronic kidney disease No date: Diabetes mellitus without complication ([place]) No date: Hypertension 2013: MRSA infection     Comment: on leg; surgery  No date: Neuropathy due to secondary diabetes ([place]) No date: Thyroid disease   Reproductive/Obstetrics                            Anesthesia Physical  Anesthesia Plan  ASA: III  Anesthesia Plan: General   Post-op Pain Management:    Induction: Intravenous  PONV Risk Score and Plan: 3 and Ondansetron, Propofol infusion and TIVA  Airway Management Planned: Natural Airway and Nasal Cannula  Additional Equipment: None  Intra-op Plan:   Post-operative Plan:   Informed Consent: I have reviewed the patients History and Physical, chart, labs and discussed the  procedure including the risks, benefits and alternatives for the proposed anesthesia with the patient or authorized representative who has indicated his/her understanding and acceptance.     Dental advisory given  Plan Discussed with: CRNA and Anesthesiologist  Anesthesia Plan Comments: (Discussed risks of anesthesia with patient, including possibility of difficulty with spontaneous ventilation under anesthesia necessitating airway intervention, PONV, and rare risks such as cardiac or respiratory or neurological events. Patient understands.)        Anesthesia Quick Evaluation

## 2020-05-14 NOTE — Transfer of Care (Signed)
Immediate Anesthesia Transfer of Care Note  Patient: Courtney [name]  Procedure(s) Performed: COLONOSCOPY WITH PROPOFOL (N/A )  Patient Location: PACU  Anesthesia Type:General  Level of Consciousness: awake, alert  and oriented  Airway & Oxygen Therapy: Patient Spontanous Breathing and Patient connected to nasal cannula oxygen  Post-op Assessment: Report given to RN and Post -op Vital signs reviewed and stable  Post vital signs: Reviewed and stable  Last Vitals:  Vitals Value Taken Time  BP 106/50 05/14/20 1014  Temp    Pulse 78 05/14/20 1015  Resp 13 05/14/20 1015  SpO2 97 % 05/14/20 1015  Vitals shown include unvalidated device data.  Last Pain:  Vitals:   05/14/20 0836  TempSrc: Temporal  PainSc: 0-No pain         Complications: No complications documented.

## 2020-05-14 NOTE — Anesthesia Postprocedure Evaluation (Signed)
Anesthesia Post Note  Patient: Courtney [name]  Procedure(s) Performed: COLONOSCOPY WITH PROPOFOL (N/A )  Patient location during evaluation: Endoscopy Anesthesia Type: General Level of consciousness: awake and alert Pain management: pain level controlled Vital Signs Assessment: post-procedure vital signs reviewed and stable Respiratory status: spontaneous breathing, nonlabored ventilation, respiratory function stable and patient connected to nasal cannula oxygen Cardiovascular status: blood pressure returned to baseline and stable Postop Assessment: no apparent nausea or vomiting Anesthetic complications: no   No complications documented.   Last Vitals:  Vitals:   05/14/20 1030 05/14/20 1040  BP: 108/61 126/76  Pulse: 79 81  Resp: 16 16  Temp:    SpO2: 100% 100%    Last Pain:  Vitals:   05/14/20 1010  TempSrc: Temporal  PainSc:                  Arita Miss

## 2020-05-15 ENCOUNTER — Other Ambulatory Visit: Payer: Self-pay | Admitting: *Deleted

## 2020-05-15 ENCOUNTER — Encounter: Payer: Self-pay | Admitting: Gastroenterology

## 2020-05-15 LAB — SURGICAL PATHOLOGY

## 2020-05-15 NOTE — Patient Outreach (Addendum)
[facility]) Care Management  05/15/2020  Courtney [name] 01/26/1956 [id number]   [place] coordination- medication concerns  Collaboration with pharmacy staff Abbey Chatters Related to patient voiced medication concerns Discussed Epic in basket collaboration with Dr Cruzita Lederer Possible [name] medication concerns may be related to local pharmacy billing concerns  (she can use the Oempic coupon /card and the pharmacy should bill her blue cross and blue shield coverage also) Pharmacy staff will outreach to her   Plan [facility] RN CM will follow up with patient in 14-21 business days Routed to MDs  [place]. Lavina Hamman, RN, BSN, [place] Coordinator Office number [phone] Mobile number [phone]  Main [facility] number [phone] Fax number [phone]

## 2020-05-19 ENCOUNTER — Other Ambulatory Visit: Payer: Self-pay

## 2020-05-19 ENCOUNTER — Encounter: Payer: Self-pay | Admitting: *Deleted

## 2020-05-19 ENCOUNTER — Other Ambulatory Visit: Payer: Self-pay | Admitting: Nurse Practitioner

## 2020-05-19 ENCOUNTER — Other Ambulatory Visit: Payer: Self-pay | Admitting: *Deleted

## 2020-05-19 ENCOUNTER — Encounter: Payer: Self-pay | Admitting: Internal Medicine

## 2020-05-19 ENCOUNTER — Ambulatory Visit (INDEPENDENT_AMBULATORY_CARE_PROVIDER_SITE_OTHER): Payer: BC Managed Care – PPO | Admitting: Internal Medicine

## 2020-05-19 VITALS — BP 140/70 | HR 82 | Ht 67.5 in | Wt 263.0 lb

## 2020-05-19 DIAGNOSIS — Z794 Long term (current) use of insulin: Secondary | ICD-10-CM

## 2020-05-19 DIAGNOSIS — IMO0002 Reserved for concepts with insufficient information to code with codable children: Secondary | ICD-10-CM

## 2020-05-19 DIAGNOSIS — E890 Postprocedural hypothyroidism: Secondary | ICD-10-CM

## 2020-05-19 DIAGNOSIS — C73 Malignant neoplasm of thyroid gland: Secondary | ICD-10-CM | POA: Diagnosis not present

## 2020-05-19 DIAGNOSIS — E1121 Type 2 diabetes mellitus with diabetic nephropathy: Secondary | ICD-10-CM

## 2020-05-19 DIAGNOSIS — Z1231 Encounter for screening mammogram for malignant neoplasm of breast: Secondary | ICD-10-CM

## 2020-05-19 DIAGNOSIS — E1165 Type 2 diabetes mellitus with hyperglycemia: Secondary | ICD-10-CM

## 2020-05-19 LAB — POCT GLYCOSYLATED HEMOGLOBIN (HGB A1C): Hemoglobin A1C: 7.8 % — AB (ref 4.0–5.6)

## 2020-05-19 NOTE — Patient Instructions (Signed)
Please continue: - Tresiba 70 units in am - Regular insulin 30 units before breakfast and 25 units before dinner - Ozempic 0.5 mg weekly in a.m.   Please continue levothyroxine 150 mcg daily.  Take the thyroid hormone every day, with water, at least 30 minutes before breakfast, separated by at least 4 hours from: - acid reflux medications - calcium - iron - multivitamins  Please return in 3 months with your sugar log.

## 2020-05-19 NOTE — Progress Notes (Signed)
Patient ID: Courtney [name], female   DOB: May 02, 1956, 64 y.o.   MRN: [id number]  This visit occurred during the SARS-CoV-2 public health emergency.  Safety protocols were in place, including screening questions prior to the visit, additional usage of staff PPE, and extensive cleaning of exam room while observing appropriate contact time as indicated for disinfecting solutions.   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 Last visit 3 months ago.  She had Covid19 08/2019: fever, chills, fatigue, lost taste.  She continues to have fatigue.  DM2: Reviewed HbA1c levels: Lab Results  Component Value Date   HGBA1C 10.7 (A) 02/13/2020   HGBA1C 12.6 (A) 11/13/2019   HGBA1C 9.8 (A) 05/13/2019   HGBA1C 13.7 (A) 10/23/2018   HGBA1C 10.1 (A) 06/25/2018   HGBA1C 8.6 (A) 02/14/2018   HGBA1C 8.3 11/13/2017   HGBA1C 9.1 08/16/2017   HGBA1C 11.3 05/31/2017   HGBA1C 11.9 02/24/2017  08/22/2016: HbA1c 11.8% 08/01/2014: HbA1c 14.5% 05/02/2014: HbA1c 14.8% 01/31/2014: HbA1c 13.6% 11/01/2013: HbA1c 15.1%  She was on: - Tresiba U200 70 units in am - ReliOn Novolin to 2-3x a day: - before breakfast and dinner:  20 units with a regular meal  25 units before a larger meal  (15 units before lunch if you eat lunch) - not using Stopped Metformin 500 mg 2x a day - b/c leg swelling. She was on Novolin 70/30 48 units 2x a day 15 min before the meals  She was on Amaryl 8 mg in am She was on Metformin >> stopped 2/2 CKD.  Then on: (May forget insulin doses) Insulin Before breakfast Before lunch Before dinner  Regular 40 10 (if you eat) 40  NPH 40  30  Please take only 25 units of R insulin if sugars before meals <80. If sugars < or = 60 before meals, skip R insulin with that meal. If you skip a  meal, check the sugars and, if high, may need to tke 50% of the insulin dose anyway. If you skip a meal, you still need to take the N insulin.  Now on: - Tresiba 70 units in am - Regular insulin 30 units before breakfast and 25 units before  dinner - Ozempic 0.5 mg weekly in a.m.   She checks sugars 2 times a day: - am:  139-150s, 200s  >> 98-144-251 >> 76-325 >> 97-130s, 207 - 2h after b'fast: n/c - before lunch: 75, 113, 165 >> 114, 192, 229 >> n/c - 2h after lunch: n/c >> 67, 228 >> n/c - before dinner: 150-160 >> 140-150 >> 70-271 >> 85-130 - 2h after dinner: n/c >> 131, 269 >> n/c - bedtime: 81-160 >> n/c >> 240s >> n/c >> 287 >> n/c - nighttime: n/c  Lowest sugar was 81 >> 139 >> 98 >> 70 >> 85; it is unclear at which level she has hypoglycemia awareness. Highest sugar was 287 (cake) >> 251 >> 325 >> 207.  Glucometer: Prodigy >> ReliOn  Pt's meals are: - Breakfast: chicken  - Lunch: peanuts, sandwich + crystal lite drink - Dinner: frozen dinner - Snacks: fruit, small bag potato chips, peanuts She was drinking sweet tea and sodas in the past and I strongly advised her to stop.  -+ Stage III CKD; BUN/creatinine:  04/27/2020: CMP normal with the exception of glucose 136, BUN/creatinine 22/2.18,  GFR 27, calcium 8.6 (8.7-10.3), alkaline phosphatase 145 (48-121), ALT 33 (0-32), Vitamin D 23.5. In PCPs note, it was mentioned that she was lost for follow-up with nephrology and she was referred back to [facility]. 10/28/2019: Glucose 96, BUN/creatinine 12/1.47, GFR 43 Lab Results  Component Value Date   BUN 21 06/25/2018   Lab Results  Component Value Date   CREATININE 1.44 (H) 06/25/2018  08/22/2016: BUN/creatinine 20/1.41 03/18/2016: BUN/creatinine 16/1.21, EGFR 57, Calcium 8.7, AST/ALT 17/24 09/18/2015: BUN/creatinine 14/1.2, GFR 57 (previously 55) 08/01/2014: 15/1.26, GFR 55 Off Cozaar due to cough.  -+ HL; lipids: 04/27/2020:  130/121/69/40 10/28/2019: 166/88/91/60 Lab Results  Component Value Date   CHOL 134 06/25/2018   HDL 70.20 06/25/2018   LDLCALC 54 06/25/2018   TRIG 47.0 06/25/2018   CHOLHDL 2 06/25/2018  03/18/2016: Lipids: 156/85/70/69 09/18/2015: 160/57/87/62 08/01/2014: 145/78/74/55 On lovastatin 20.  - last eye exam was on 04/2020: + DR; she had laser surgeries and continues  intraocular injections -before last visit she restarted IO inj for macular edema.  She had left eye cataract surgery.  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  - She did not have RAI treatment at that time, however, she was found to have a thyroid mass in 2005 >> This was biopsied and returned as normal thyroid tissue, but had RAI treatment at that time.  - The posttreatment whole-body scan was negative for any cancer spread.   Thyroglobulin returned at 2.3 in 08/2014 >> I ordered a neck ultrasound that showed a stable nodule with a fatty hilum, consistent with a benign lymph node, but no other masses in the surgical bed.   The thyroglobulin level remained detectable, and was actually 9.9 in 10/2015.   A neck ultrasound (11/2015) showed a stable 1.4 nodule in the thyroid bed, consistent with a benign process   Her thyroglobulin levels continue to be detectable  A neck ultrasound (02/2020) was negative for metastasis or recurrences  A whole-body scan (03/2020) was also negative for abnormal uptake  Reviewed pertinent labs: Lab Results  Component Value Date   THYROGLB 3.3 02/13/2020   THYROGLB 2.1 (L) 05/13/2019   THYROGLB 2.5 (L) 06/25/2018   THYROGLB 1.8 (L) 08/15/2017   THYROGLB 2.5 (L) 08/29/2016   THYROGLB 9.9 11/06/2015   THYROGLB 2.4 (L)  09/18/2015   THYROGLB 0.8 (L) 12/26/2014   THYROGLB 2.3 (L) 08/22/2014   THGAB <1 02/13/2020   THGAB <1 05/13/2019   THGAB <1 06/25/2018   THGAB <1 08/15/2017   THGAB <1 08/29/2016   THGAB <1 09/18/2015   THGAB <1.0 12/26/2014   THGAB <1 08/22/2014   THGAB <1.0 08/22/2014   Postsurgical hypothyroidism:  Reviewed her TFTs: Lab Results  Component Value Date   TSH 0.91 02/13/2020   TSH 0.89 05/13/2019   TSH 1.39 06/25/2018   TSH 0.61 08/15/2017   TSH 1.82 08/29/2016   FREET4 1.37 02/13/2020   FREET4 1.24 05/13/2019   FREET4 0.99 06/25/2018   FREET4 1.04 08/15/2017   FREET4 1.30 08/29/2016  10/28/2019: TSH 1.6 08/01/2014: TSH 1.89  Pt is on levothyroxine 150 mcg daily, taken: - in am - fasting - at least 30 min from b'fast - no Ca, Fe, MVI, PPIs - no Biotin  No FH of thyroid cancer. No h/o radiation tx to head or neck other than RAI treatment.  No herbal supplements. No recent steroids use.   Pt denies: - feeling nodules in neck - hoarseness - dysphagia - choking - SOB with lying down  Other labs reviewed prior records from PCP: 10/28/2019: Vitamin D 39.2  No h/o pancreatitis.  ROS: Constitutional: no weight gain/no weight loss, + fatigue, no subjective hyperthermia, no subjective hypothermia Eyes: no blurry vision, no xerophthalmia ENT: no sore throat, + see HPI Cardiovascular: no CP/no SOB/no palpitations/no leg swelling Respiratory: no cough/no SOB/no wheezing Gastrointestinal: no N/no V/no D/no C/no acid reflux Musculoskeletal: no muscle aches/no joint aches Skin: no rashes, no hair loss Neurological: no tremors/+ numbness/+ tingling/no dizziness  I reviewed pt's medications, allergies, PMH, social hx, family hx, and changes were documented in the history of present illness. Otherwise, unchanged from my initial visit note..  Past Medical History:  Diagnosis Date  . Anemia   . Arthritis   . Chronic kidney disease   . Diabetes mellitus without  complication ([place])   . Hyperlipidemia   . Hypertension   . MRSA infection 2013   on leg; surgery   . Neuropathy due to secondary diabetes ([place])   . Thyroid disease    Past Surgical History:  Procedure Laterality Date  . BILATERAL CARPAL TUNNEL RELEASE Bilateral 2003  . CATARACT EXTRACTION W/ INTRAOCULAR LENS IMPLANT Left 2016  . CHOLECYSTECTOMY    . COLONOSCOPY WITH PROPOFOL N/A 05/14/2020   Procedure: COLONOSCOPY WITH PROPOFOL;  Surgeon: Lin Landsman, MD;  Location: [facility] ENDOSCOPY;  Service: Gastroenterology;  Laterality: N/A;  . 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 EYE SURGERY    . KNEE ARTHROSCOPY Left 05/05/2016   Procedure: ARTHROSCOPY KNEE;  Surgeon: Thornton Park, MD;  Location: [facility] ORS;  Service: Orthopedics;  Laterality: Left;  . SHOULDER ARTHROSCOPY Left 2004  . THYROID SURGERY  2004  . TRIGGER FINGER RELEASE  2003   History   Social History Main Topics  . Smoking status: Former Research scientist (life sciences)  . Smokeless tobacco: Not on file  . Alcohol Use: No  . Drug Use: No   Social History Narrative   Single   0 children   [place] (travels regularly)      Beer and wine on occasion   First menstrual cycle: 6th grade   Postmenopausal      Current Outpatient Medications  Medication Sig Dispense Refill  . amLODipine (NORVASC) 10 MG tablet Take 10 mg by mouth daily. Reported on 11/06/2015    . ergocalciferol (VITAMIN D2) 1.25 MG (50000 UT) capsule Vitamin D2 1,250 mcg (50,000 unit) capsule    . hydrochlorothiazide (HYDRODIURIL) 25 MG tablet Take 25 mg by mouth daily.     . insulin degludec (TRESIBA FLEXTOUCH) 200 UNIT/ML FlexTouch Pen Inject under skin 70 units daily 3 pen 5  . insulin regular (NOVOLIN R RELION) 100 units/mL injection INJECT 30 UNITS SUB-Q 3 TIMES DAILY BEFORE MEALS. ReliOn insulin. 20 mL 11  . Insulin Syringe-Needle U-100 31G X 15/64" 0.5 ML MISC Use 3x a day 100 each 11  . levothyroxine (SYNTHROID) 150 MCG tablet Take 1 tablet (150 mcg total) by  mouth daily before breakfast. 90 tablet 3  . losartan (COZAAR) 100 MG tablet Take 100 mg by mouth daily.     Marland Kitchen lovastatin (MEVACOR) 20 MG tablet Take 20 mg by mouth at bedtime.     . Semaglutide,0.25 or 0.5MG/DOS, (OZEMPIC, 0.25 OR 0.5  MG/DOSE,) 2 MG/1.5ML SOPN Inject 0.375 mLs (0.5 mg total) into the skin once a week. 2 pen 5  . triamterene-hydrochlorothiazide (DYAZIDE) 37.5-25 MG capsule Take 1 capsule by mouth every morning.     No current facility-administered medications for this visit.   NKDA   Family History  Problem Relation Age of Onset  . Hypertension Mother   . Thyroid disease Mother   . CVA Mother   . Hypertension Sister   . Thyroid disease Sister   . Hypertension Brother   . Hyperlipidemia Brother    PE: BP 140/70   Pulse 82   Ht 5' 7.5" (1.715 m)   Wt 263 lb (119.3 kg)   SpO2 97%   BMI 40.58 kg/m  Body mass index is 40.58 kg/m. Wt Readings from Last 3 Encounters:  05/19/20 263 lb (119.3 kg)  05/14/20 264 lb (119.7 kg)  02/13/20 274 lb (124.3 kg)   Constitutional: overweight, in NAD Eyes: PERRLA, EOMI, no exophthalmos ENT: moist mucous membranes, no thyromegaly, no cervical lymphadenopathy Cardiovascular: RRR, No MRG Respiratory: CTA B Gastrointestinal: abdomen soft, NT, ND, BS+ Musculoskeletal: no deformities, strength intact in all 4 Skin: moist, warm, no rashes Neurological: no tremor with outstretched hands, DTR normal in all 4  ASSESSMENT: 1. DM2, insulin-dependent, uncontrolled, with complications - CKD - DR  She does not have a family history of medullary thyroid cancer a personal history of pancreatitis.  2. 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 She did not have RAI treatment at that time, however, she was found to have a thyroid mass in 2005. This was biopsied and returned as  normal thyroid tissue.She had RAI treatment at that time. The posttreatment whole-body scan was negative for any cancers..   - Neck U/S (09/19/2014): 1. Post total thyroidectomy. 2. No change in the previously biopsied approximately 1.4 cm echogenic solid nodule within the inferior aspect of the right lobe of the thyroid, grossly unchanged since the 2005 examination. 3. Apparent development of an approximately 0.4 cm hypoechoic nodule within the right thyroidectomy bed - while too small for definitive characterization, this nodule appears to contain an echogenic hilum and thus is favored to represent a non pathologically enlarged cervical lymph node.  - Neck U/S (11/27/2015):  1. Surgical changes of prior total thyroidectomy. 2. Unchanged 1.4 cm echogenic soft tissue nodule in the right thyroid resection bed. Continued stability over time consistent with a benign process.  - Neck U/S (03/02/2020): Stable surgical changes following total thyroidectomy. No residual thyroid bed abnormality.  - WBS (03/27/2020): her copay: 6800$!!  No evidence of local thyroid cancer recurrence or distant metastasis by I 131 imaging.   3. Post surgical hypothyroidism  PLAN:  1. Patient with longstanding, uncontrolled, type 2 diabetes, on basal-bolus insulin regimen to which we added the GLP-1 receptor agonist at last visit.  This was approved in 02/2020.  At last visit I also suggested to change from NPH to am [place] as she switched to a new insurance: [employer].  At that time sugars were extremely fluctuating and she was forgetting doses of both regular and NPH.  She was especially prone to forget insulin doses at night. - she was able to start Ozempic  - she tolerates this well - she lost 11 lbs since last OV >> congratulated her -  Her sugars  improved significantly since last visit and she was able to reduce her regular insulin dose before dinner.  At today's visit, we reviewed her sugars  and they are almost all at goal, with only few exceptions.  This is an excellent result so for now I advised her to continue the current regimen.  I appreciate the help from the [facility] pharmacists in getting her Tyler Aas and Ozempic! - I advised her to: Patient Instructions  Please continue: - Tresiba 70 units in am - Regular insulin 30 units before breakfast and 25 units before dinner - Ozempic 0.5 mg weekly in a.m.   Please continue levothyroxine 150 mcg daily.  Take the thyroid hormone every day, with water, at least 30 minutes before breakfast, separated by at least 4 hours from: - acid reflux medications - calcium - iron - multivitamins  Please return in 3 months with your sugar log.   - we checked her HbA1c: 7.8% (much better) - advised to check sugars at different times of the day - 3x a day, rotating check times - advised for yearly eye exams >> she is UTD - return to clinic in 3 months        2. 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   -Her thyroglobulin levels have been fluctuating in the past.  At last visit, this was higher -Her neck ultrasound from 11/2015 showed a stable mass since 2018, consistent with a benign process. We repeated a neck ultrasound on 03/02/2020 and this showed stable surgical changes with no residual thyroid bed abnormality.  A whole-body scan showed no evidence of local recurrence or metastasis.  Unfortunately, this was very expensive and she still has to pay almost $7000 for it. -No neck compression symptoms -We will recheck her thyroglobulin at next OV and if still increasing, may need a PET scan, however, we first have to make sure that this would be covered by her insurance   3. Postsurgical hypothyroidism - latest thyroid labs reviewed with pt >> normal: Lab Results  Component Value Date   TSH 0.91 02/13/2020   - she continues on LT4 150 mcg daily - pt  feels good on this dose. - we discussed about taking the thyroid hormone every day, with water, >30 minutes before breakfast, separated by >4 hours from acid reflux medications, calcium, iron, multivitamins. Pt. is taking it correctly.     [name], MD PhD [facility] Endocrinology

## 2020-05-19 NOTE — Addendum Note (Signed)
Addended by: Cardell Peach I on: 05/19/2020 12:58 PM   Modules accepted: Orders

## 2020-05-19 NOTE — Patient Outreach (Signed)
[facility]) Care Management  05/19/2020  ADELL KOVAL Sep 12, 1956 [id number]   [facility] Telephone Assessment/Screen for MD referral Reassigned to This [facility] RN CM on 03/05/20 [name] Referral Date:02/20/20 Referral Source:MD referral Referral Reason:02/20/2020 [name], MD: MD referral  Reason for consultDM 2 - cannot afford GLP1 R agonist Diagnoses ofDiabetes  Insurance:blue cross and blue shieldblue Advantage PPO Rx 4 D ( affordable care  Outreach successful to her own number  Patient is able to verify HIPAA, DOB and address  Follow up -medication, weight management Ms Hirata confirms she had outreach from [place], [facility] pharmacist about her Ozempic concerns (elevated cost in August 2020)She and pharmacist completed a conference call to her pharmacy to get all her medication costs to $0 except her Regular insulin ([facility] brand at discounted cost) this will assist with her fixed income issues. [facility] RN CM and she discussed encouraging the pharmacy to process each of her future transactions with the coupon cards.   She confirms working with Dr Cruzita Lederer today during her office visit She confirm HgA1c 7.8 today Was 10.7 on 02/13/20, 12.6 on 11/13/19 and 9.8 on 05/13/19 Now on: - Tresiba 70 units in am - Regular insulin 30 units before breakfast and 25 units before  dinner - Ozempic 0.5 mg weekly in a.m.  She checks sugars 2 times a day: She reports a loss of 11 lbs She was commended for her weight loss and encouraged to continue whatever she is doing  Cbg numbers are getting better related to the use of Ozempic but concern voiced about the anticipated increase in cost after January 2022   Weight Management programs Discussed bariatric program (not for surgery but meal plan assistance/ monitoring via [place] program) and nutritionist/dietitian individualized plans She has been to  Diabetes (DM) type 2  nutrition classes and felt not beneficial as the concentration was on what she could not eat  She wants the opportunity to speak with a friend who is a nutritionist who previously worked a [employer] prior to considering other consult/referrals  Social: Mrs KEYIRA MONDESIR 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 support of her family O children  She is independent with her care needs and transportation She is on a fixed income    Conditions:uncontrolled type 2 Diabetes Mellitus with nephrology with long term use of insulin (Insulin since 2000),Hypertension (HTN),Chronic Kidney disease (CKD)stage 3, multifocal follicular variant of papillary thyroid cancertotal thyroidectomy in 2004(in remission) , post surgical hypothyroidism, postmenopausal bleeding (PMB),endometrial cells on cervical pap smear inconsistent with LMP, abscess of groin, thigh, neck, vulva, leg ulcer,Hyperlipidemia (HLD), Kidney lump depression, anemia, 2013 MRSA infection, eye surgery- 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  [facility]) Patient will be able to manage Diabetes at home (pt-stated)   On track     [place] (see longitudinal plan of care for additional care plan information)  Objective:  Lab Results  Component Value Date   HGBA1C 7.8 (A) 05/19/2020 .  Lab Results  Component Value Date   CREATININE 1.44 (H) 06/25/2018   CREATININE 1.29 (H) 04/27/2016   CREATININE 1.44 (H) 07/15/2012 .   Marland Kitchen No results found for: EGFR  Current Barriers:  Marland Kitchen Knowledge  Deficits related to basic Diabetes pathophysiology and self care/management . Difficulty obtaining or cannot afford medications- Ozempic and Tresiba . Cognitive Deficits  Case Manager Clinical Goal(s):  Over the next 45 days, patient will demonstrate improved adherence to prescribed treatment plan for diabetes self care/management as evidenced by: lower HgA1c value  Goal being met HgA1c 7.8 today 05/19/20  Was 10.7 on 02/13/20, 12.6 on 11/13/19 and 9.8 on 05/13/19 . over the next 90 days patient will reports better home management of diabetes after medication assistance during outreach contacts Goal being met Progression made  . over the next 30 days patient will receive assistance with Ozempic and tresiba prn as evidence by verbalization during further outreach calls- Goal met 05/19/20  . Over the next 100 days patient will continue to work on reducing her weight and reduce use of DM medicines as evidence by weight values decreased and discontinuation of DM medicines per endocrinologist  Interventions:  . Provided education to patient about basic DM disease process . Reviewed medications with patient and discussed importance of medication adherence . Discussed plans with patient for ongoing care management follow up and provided patient with direct contact information for care management team . Reviewed scheduled/upcoming provider appointments including: pcp and endocrinology . Referral to pharmacy  . Commended her on her recent weight loss of 11 lbs Discussed weight managements options to include consult to dietitian/nutrition/bariatric program for individual meal planning   Patient Self Care Activities:  . Self administers oral medications as prescribed . Self administers insulin as prescribed . Self administers injectable DM medication (Ozempic, tresiba, regular insulin) as prescribed . Attends all scheduled provider appointments . Checks blood sugars as prescribed and utilize hyper and  hypoglycemia protocol as needed . Adheres to prescribed ADA/carb modified  Please see past updates related to this goal by clicking on the "Past Updates" button in the selected goal  Please see other previous [facility] care plan information listed in Epic under the flow sheet section         Merilynn Haydu L. Lavina Hamman, RN, BSN, [place] Coordinator Office number [phone] [facility] number [phone] Fax number [phone]

## 2020-06-01 DIAGNOSIS — E113511 Type 2 diabetes mellitus with proliferative diabetic retinopathy with macular edema, right eye: Secondary | ICD-10-CM | POA: Diagnosis not present

## 2020-06-02 DIAGNOSIS — E119 Type 2 diabetes mellitus without complications: Secondary | ICD-10-CM | POA: Diagnosis not present

## 2020-06-02 DIAGNOSIS — H5201 Hypermetropia, right eye: Secondary | ICD-10-CM | POA: Diagnosis not present

## 2020-06-15 ENCOUNTER — Other Ambulatory Visit: Payer: Self-pay

## 2020-06-15 ENCOUNTER — Ambulatory Visit
Admission: RE | Admit: 2020-06-15 | Discharge: 2020-06-15 | Disposition: A | Discharge status: Home / Self Care | Payer: BC Managed Care – PPO | Source: Ambulatory Visit | Attending: Nurse Practitioner | Admitting: Nurse Practitioner

## 2020-06-15 DIAGNOSIS — Z1231 Encounter for screening mammogram for malignant neoplasm of breast: Secondary | ICD-10-CM | POA: Diagnosis not present

## 2020-07-01 DIAGNOSIS — I129 Hypertensive chronic kidney disease with stage 1 through stage 4 chronic kidney disease, or unspecified chronic kidney disease: Secondary | ICD-10-CM | POA: Diagnosis not present

## 2020-07-01 DIAGNOSIS — N1832 Chronic kidney disease, stage 3b: Secondary | ICD-10-CM | POA: Diagnosis not present

## 2020-07-01 DIAGNOSIS — N2889 Other specified disorders of kidney and ureter: Secondary | ICD-10-CM | POA: Diagnosis not present

## 2020-07-01 DIAGNOSIS — E1122 Type 2 diabetes mellitus with diabetic chronic kidney disease: Secondary | ICD-10-CM | POA: Diagnosis not present

## 2020-07-01 DIAGNOSIS — R809 Proteinuria, unspecified: Secondary | ICD-10-CM | POA: Diagnosis not present

## 2020-07-13 DIAGNOSIS — E113513 Type 2 diabetes mellitus with proliferative diabetic retinopathy with macular edema, bilateral: Secondary | ICD-10-CM | POA: Diagnosis not present

## 2020-07-17 DIAGNOSIS — E113512 Type 2 diabetes mellitus with proliferative diabetic retinopathy with macular edema, left eye: Secondary | ICD-10-CM | POA: Diagnosis not present

## 2020-07-20 ENCOUNTER — Telehealth: Payer: Self-pay

## 2020-07-20 ENCOUNTER — Other Ambulatory Visit: Payer: Self-pay | Admitting: Internal Medicine

## 2020-07-20 NOTE — Telephone Encounter (Signed)
New message     1. Which medications need to be refilled? (please list name of each medication and dose if known) insulin degludec (TRESIBA FLEXTOUCH) 200 UNIT/ML FlexTouch Pen  2. Which pharmacy/location (including street and city if local pharmacy) is medication to be sent to?[facility] in [place]

## 2020-07-28 DIAGNOSIS — M109 Gout, unspecified: Secondary | ICD-10-CM | POA: Diagnosis not present

## 2020-07-28 DIAGNOSIS — Z6841 Body Mass Index (BMI) 40.0 and over, adult: Secondary | ICD-10-CM | POA: Diagnosis not present

## 2020-07-28 DIAGNOSIS — M79671 Pain in right foot: Secondary | ICD-10-CM | POA: Diagnosis not present

## 2020-08-13 ENCOUNTER — Other Ambulatory Visit: Payer: Self-pay | Admitting: Internal Medicine

## 2020-08-19 ENCOUNTER — Other Ambulatory Visit: Payer: Self-pay

## 2020-08-19 ENCOUNTER — Encounter: Payer: Self-pay | Admitting: Internal Medicine

## 2020-08-19 ENCOUNTER — Ambulatory Visit (INDEPENDENT_AMBULATORY_CARE_PROVIDER_SITE_OTHER): Payer: BC Managed Care – PPO | Admitting: Internal Medicine

## 2020-08-19 VITALS — BP 120/70 | HR 89 | Ht 67.0 in | Wt 260.2 lb

## 2020-08-19 DIAGNOSIS — E1121 Type 2 diabetes mellitus with diabetic nephropathy: Secondary | ICD-10-CM | POA: Diagnosis not present

## 2020-08-19 DIAGNOSIS — IMO0002 Reserved for concepts with insufficient information to code with codable children: Secondary | ICD-10-CM

## 2020-08-19 DIAGNOSIS — E890 Postprocedural hypothyroidism: Secondary | ICD-10-CM | POA: Diagnosis not present

## 2020-08-19 DIAGNOSIS — E1165 Type 2 diabetes mellitus with hyperglycemia: Secondary | ICD-10-CM

## 2020-08-19 DIAGNOSIS — Z794 Long term (current) use of insulin: Secondary | ICD-10-CM | POA: Diagnosis not present

## 2020-08-19 DIAGNOSIS — C73 Malignant neoplasm of thyroid gland: Secondary | ICD-10-CM | POA: Diagnosis not present

## 2020-08-19 LAB — T4, FREE: Free T4: 1.47 ng/dL (ref 0.60–1.60)

## 2020-08-19 LAB — TSH: TSH: 0.1 u[IU]/mL — ABNORMAL LOW (ref 0.35–4.50)

## 2020-08-19 LAB — POCT GLYCOSYLATED HEMOGLOBIN (HGB A1C): Hemoglobin A1C: 6.8 % — AB (ref 4.0–5.6)

## 2020-08-19 NOTE — Progress Notes (Signed)
Patient ID: Courtney [name], female   DOB: 1956-02-18, 64 y.o.   MRN: [id number]  This visit occurred during the SARS-CoV-2 public health emergency.  Safety protocols were in place, including screening questions prior to the visit, additional usage of staff PPE, and extensive cleaning of exam room while observing appropriate contact time as indicated for disinfecting solutions.   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 Last visit 3 months ago.  She had Covid19 08/2019 but she recovered afterwards.  DM2: Reviewed HbA1c levels: Lab Results  Component Value Date   HGBA1C 7.8 (A) 05/19/2020   HGBA1C 10.7 (A) 02/13/2020   HGBA1C 12.6 (A) 11/13/2019   HGBA1C 9.8 (A) 05/13/2019   HGBA1C 13.7 (A) 10/23/2018   HGBA1C 10.1 (A) 06/25/2018   HGBA1C 8.6 (A) 02/14/2018   HGBA1C 8.3 11/13/2017   HGBA1C 9.1 08/16/2017   HGBA1C 11.3 05/31/2017   HGBA1C 11.9 02/24/2017   HGBA1C 9.5 11/25/2016   HGBA1C 8.2 (H) 04/13/2016   HGBA1C 9.1 02/05/2016   HGBA1C 9.8 (H) 09/18/2015   HGBA1C 9.9 06/19/2015   HGBA1C 11.0 03/27/2015   HGBA1C 10.9 (H) 12/26/2014  08/22/2016: HbA1c 11.8% 08/01/2014: HbA1c 14.5% 05/02/2014: HbA1c 14.8% 01/31/2014: HbA1c 13.6% 11/01/2013: HbA1c 15.1%  She was on: - Tresiba U200 70 units in am - ReliOn Novolin to 2-3x a day: - before breakfast and dinner:  20 units with a regular meal  25 units before a larger meal  (15 units before lunch if you eat lunch) - not using Stopped Metformin 500 mg 2x a day - b/c leg swelling. She was on Novolin 70/30 48 units 2x a day 15 min before the meals  She was on Amaryl 8 mg in am She was on Metformin >> stopped 2/2 CKD.  Then on: (May forget insulin doses) Insulin Before breakfast Before lunch Before dinner  Regular 40 10 (if  you eat) 40  NPH 40  30  Please take only 25 units of R insulin if sugars before meals <80. If sugars < or = 60 before meals, skip R insulin with that meal. If you skip a meal, check the sugars and, if high, may need to tke 50% of the insulin dose anyway. If you skip a meal, you still need to take the N insulin.  She is currently on: - Tresiba 70 units in a.m. - Regular insulin 30 units before breakfast and 25 units before dinner - Ozempic 0.5 mg weekly in a.m.   She checks sugars twice a day: - am:  98-144-251 >> 76-325 >> 97-130s, 207 >> 89-133, 146-186 if forgets R before dinner - 2h after b'fast: n/c - before lunch: 75, 113, 165 >> 114, 192, 229 >> n/c >> 135 - 2h after lunch: n/c >> 67, 228 >> n/c - before dinner:140-150 >> 70-271 >> 85-130 >> 63, 75-124, 150 - 2h after dinner: n/c >> 131, 269 >> n/c - bedtime: n/c >> 240s >> n/c >> 287 >> n/c > 108, 168 - nighttime: n/c  Lowest sugar was 70 >> 85 >> 63; it is unclear at which level she has hypoglycemia Highest sugar was 325 >> 207 >> 196.  Glucometer: Prodigy >> ReliOn  Pt's meals are: - Breakfast: chicken  - Lunch: peanuts, sandwich + crystal lite drink - Dinner: frozen dinner - Snacks: fruit, small bag potato  chips, peanuts She was drinking sweet tea and sodas in the past and I strongly advised her to stop.  -+ Stage III CKD; BUN/creatinine:  04/27/2020: CMP normal with the exception of glucose 136, BUN/creatinine 22/2.18, GFR 27, calcium 8.6 (8.7-10.3), alkaline phosphatase 145 (48-121), ALT 33 (0-32), Vitamin D 23.5. In PCPs note, it was mentioned that she was lost for follow-up with nephrology and she was referred back to [facility]. 10/28/2019: Glucose 96, BUN/creatinine 12/1.47, GFR 43 Lab Results  Component Value Date   BUN 21 06/25/2018   Lab Results  Component Value Date   CREATININE 1.44 (H) 06/25/2018  08/22/2016: BUN/creatinine 20/1.41 03/18/2016: BUN/creatinine 16/1.21, EGFR 57,  Calcium 8.7, AST/ALT 17/24 09/18/2015: BUN/creatinine 14/1.2, GFR 57 (previously 55) 08/01/2014: 15/1.26, GFR 55 On Cozaar.  -+ HL; lipids: 04/27/2020: 130/121/69/40 10/28/2019: 166/88/91/60 Lab Results  Component Value Date   CHOL 134 06/25/2018   HDL 70.20 06/25/2018   LDLCALC 54 06/25/2018   TRIG 47.0 06/25/2018   CHOLHDL 2 06/25/2018  03/18/2016: Lipids: 156/85/70/69 09/18/2015: 160/57/87/62 08/01/2014: 145/78/74/55 On lovastatin 20.  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  She had left eye cataract surgery.  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  - She did not have RAI treatment at that time, however, she was found to have a thyroid mass in 2005 >> This was biopsied and returned as normal thyroid tissue, but had RAI treatment at that time.  - The posttreatment whole-body scan was negative for any cancer spread.   Thyroglobulin returned at 2.3 in 08/2014 >> I ordered a neck ultrasound that showed a stable nodule with a fatty hilum, consistent with a benign lymph node, but no other masses in the surgical bed.   The thyroglobulin level remained detectable, and was actually 9.9 in 10/2015.   A neck ultrasound (11/2015) showed a stable 1.4 nodule in the thyroid bed, consistent with a benign process   Her thyroglobulin levels continue to be detectable  A neck ultrasound (02/2020) was negative for metastasis or recurrences  A whole-body scan (03/2020) was also negative for abnormal uptake  Reviewed her thyroglobulin and ATA antibodies: Lab Results  Component Value Date   THYROGLB 3.3 02/13/2020   THYROGLB 2.1 (L) 05/13/2019   THYROGLB 2.5 (L)  06/25/2018   THYROGLB 1.8 (L) 08/15/2017   THYROGLB 2.5 (L) 08/29/2016   THYROGLB 9.9 11/06/2015   THYROGLB 2.4 (L) 09/18/2015   THYROGLB 0.8 (L) 12/26/2014   THYROGLB 2.3 (L) 08/22/2014   THGAB <1 02/13/2020   THGAB <1 05/13/2019   THGAB <1 06/25/2018   THGAB <1 08/15/2017   THGAB <1 08/29/2016   THGAB <1 09/18/2015   THGAB <1.0 12/26/2014   THGAB <1 08/22/2014   THGAB <1.0 08/22/2014   Postsurgical hypothyroidism:  Reviewed her TFTs: Lab Results  Component Value Date   TSH 0.91 02/13/2020   TSH 0.89 05/13/2019   TSH 1.39 06/25/2018   TSH 0.61 08/15/2017   TSH 1.82 08/29/2016   FREET4 1.37 02/13/2020   FREET4 1.24 05/13/2019   FREET4 0.99 06/25/2018   FREET4 1.04 08/15/2017   FREET4 1.30 08/29/2016  10/28/2019: TSH 1.6 08/01/2014: TSH 1.89  Pt is on levothyroxine 150 mcg daily, taken: - in am - fasting - at least 30 min from b'fast - no calcium - no iron - no multivitamins - no PPIs - not on Biotin  No FH of thyroid cancer. No h/o radiation tx to head or neck other than RAI treatment.  No herbal supplements. No Biotin use. No recent steroids use.   Pt denies: - feeling nodules in neck - hoarseness - dysphagia - choking - SOB with lying down  Other labs reviewed prior records from PCP: 10/28/2019: Vitamin D 39.2  No h/o pancreatitis.  ROS: Constitutional: no weight gain/+ weight loss, no fatigue, no subjective hyperthermia, no subjective hypothermia Eyes: no blurry vision, no xerophthalmia ENT: no sore throat, + see HPI Cardiovascular: no CP/no SOB/no palpitations/no leg swelling Respiratory: no cough/no SOB/no wheezing Gastrointestinal: no N/no V/no D/no C/no acid reflux Musculoskeletal: no muscle aches/no joint aches Skin: no rashes, no hair loss Neurological: no tremors/+ numbness/+ tingling/no dizziness  I reviewed pt's medications, allergies, PMH, social hx, family hx, and changes were documented in the history of present illness. Otherwise,  unchanged from my initial visit note.  Past Medical History:  Diagnosis Date  . Anemia   . Arthritis   . Chronic kidney disease   . Diabetes mellitus without complication ([place])   . Hyperlipidemia   . Hypertension   . MRSA infection 2013   on leg; surgery   . Neuropathy due to secondary diabetes ([place])   . Thyroid disease    Past Surgical History:  Procedure Laterality Date  . BILATERAL CARPAL TUNNEL RELEASE Bilateral 2003  . CATARACT EXTRACTION W/ INTRAOCULAR LENS IMPLANT Left 2016  . CHOLECYSTECTOMY    . COLONOSCOPY WITH PROPOFOL N/A 05/14/2020   Procedure: COLONOSCOPY WITH PROPOFOL;  Surgeon: Lin Landsman, MD;  Location: [facility] ENDOSCOPY;  Service: Gastroenterology;  Laterality: N/A;  . 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 EYE SURGERY    . KNEE ARTHROSCOPY Left 05/05/2016   Procedure: ARTHROSCOPY KNEE;  Surgeon: Thornton Park, MD;  Location: [facility] ORS;  Service: Orthopedics;  Laterality: Left;  . SHOULDER ARTHROSCOPY Left 2004  . THYROID SURGERY  2004  . TRIGGER FINGER RELEASE  2003   History   Social History Main Topics  . Smoking status: Former Research scientist (life sciences)  . Smokeless tobacco: Not on file  . Alcohol Use: No  . Drug Use: No   Social History Narrative   Single   0 children   [place] (travels regularly)      Beer and wine on occasion   First menstrual cycle: 6th grade   Postmenopausal      Current Outpatient Medications  Medication Sig Dispense Refill  . amLODipine (NORVASC) 10 MG tablet Take 10 mg by mouth daily. Reported on 11/06/2015    . ergocalciferol (VITAMIN D2) 1.25 MG (50000 UT) capsule Vitamin D2 1,250 mcg (50,000 unit) capsule    . hydrochlorothiazide (HYDRODIURIL) 25 MG tablet Take 25 mg by mouth daily.     . insulin regular (NOVOLIN R RELION) 100 units/mL injection INJECT 30 UNITS SUB-Q 3 TIMES DAILY BEFORE MEALS. ReliOn insulin. 20 mL 11  . Insulin Syringe-Needle U-100 31G X 15/64" 0.5 ML MISC Use 3x a day 100 each 11  . levothyroxine  (SYNTHROID) 150 MCG tablet Take 1 tablet (150 mcg total) by mouth daily before breakfast. 90 tablet 3  . losartan (COZAAR) 100 MG tablet Take 100 mg by mouth daily.     Marland Kitchen lovastatin (MEVACOR) 20  MG tablet Take 20 mg by mouth at bedtime.     . Semaglutide,0.25 or 0.5MG/DOS, (OZEMPIC, 0.25 OR 0.5 MG/DOSE,) 2 MG/1.5ML SOPN Inject 0.375 mLs (0.5 mg total) into the skin once a week. 2 pen 5  . TRESIBA FLEXTOUCH 200 UNIT/ML FlexTouch Pen INJECT 70 UNITS SUBCUTANEOUSLY ONCE DAILY 9 mL 0  . triamterene-hydrochlorothiazide (DYAZIDE) 37.5-25 MG capsule Take 1 capsule by mouth every morning.     No current facility-administered medications for this visit.   NKDA   Family History  Problem Relation Age of Onset  . Hypertension Mother   . Thyroid disease Mother   . CVA Mother   . Hypertension Sister   . Thyroid disease Sister   . Hypertension Brother   . Hyperlipidemia Brother    PE: BP 120/70   Pulse 89   Ht _0  (1.702 m)   Wt 260 lb 3.2 oz (118 kg)   SpO2 96%   BMI 40.75 kg/m  Body mass index is 40.75 kg/m. Wt Readings from Last 3 Encounters:  08/19/20 260 lb 3.2 oz (118 kg)  05/19/20 263 lb (119.3 kg)  05/14/20 264 lb (119.7 kg)   Constitutional: overweight, in NAD Eyes: PERRLA, EOMI, no exophthalmos ENT: moist mucous membranes, no thyromegaly, no cervical lymphadenopathy Cardiovascular: RRR, No MRG Respiratory: CTA B Gastrointestinal: abdomen soft, NT, ND, BS+ Musculoskeletal: no deformities, strength intact in all 4 Skin: moist, warm, no rashes Neurological: no tremor with outstretched hands, DTR normal in all 4  ASSESSMENT: 1. DM2, insulin-dependent, uncontrolled, with complications - CKD - DR  She does not have a family history of medullary thyroid cancer a personal history of pancreatitis.  2. 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 She did not have RAI treatment at that time, however, she was found to have a thyroid mass in 2005. This was biopsied and returned as normal thyroid tissue.She had RAI treatment at that time. The posttreatment whole-body scan was negative for any cancers..   - Neck U/S (09/19/2014): 1. Post total thyroidectomy. 2. No change in the previously biopsied approximately 1.4 cm echogenic solid nodule within the inferior aspect of the right lobe of the thyroid, grossly unchanged since the 2005 examination. 3. Apparent development of an approximately 0.4 cm hypoechoic nodule within the right thyroidectomy bed - while too small for definitive characterization, this nodule appears to contain an echogenic hilum and thus is favored to represent a non pathologically enlarged cervical lymph node.  - Neck U/S (11/27/2015):  1. Surgical changes of prior total thyroidectomy. 2. Unchanged 1.4 cm echogenic soft tissue nodule in the right thyroid resection bed. Continued stability over time consistent with a benign process.  - Neck U/S (03/02/2020): Stable surgical changes following total thyroidectomy. No residual thyroid bed abnormality.  - WBS (03/27/2020): her copay: 6800$!!  No evidence of local thyroid cancer recurrence or distant metastasis by I 131 imaging.   3. Post surgical hypothyroidism  PLAN:  1. Patient with longstanding, uncontrolled, type 2 diabetes, on basal-bolus insulin regimen and now also GLP-1 receptor agonist.  This was finally approved in 02/2020 and her sugars improved significantly after starting it.  Also, we were able to change from [place] to [place] after she changed her insurance to [employer].  Appreciate the help from the [facility] pharmacist in getting her Tyler Aas and Bolt! At  last visit, she lost 11 pounds and her sugars were much improved and almost all at goal, with only few hyperglycemic exceptions.  Her HbA1c at that time was  7.8%, much better.  We did not change her regimen. -Today's visit, sugars are at goal in the morning with the exception of the times that she forgets to take her regular insulin before dinner.  This happens when she is out as she is not usually taking insulin with her.  I advised her that she needs to take it with her, however, if she absolutely cannot do this and she injects it only after she gets home, to take a lower dose.  Otherwise, sugars are at goal at most checks with the exception of a few mildly hyperglycemic values and also very few blood sugars in the 60s if she was more active. -For now, I would not recommend to change her regimen. - I advised her to: Patient Instructions  Please continue: - Tresiba 70 units in am - Regular insulin 30 units before breakfast and 25 units before dinner - Ozempic 0.5 mg weekly in a.m.   Please continue levothyroxine 150 mcg daily.  Take the thyroid hormone every day, with water, at least 30 minutes before breakfast, separated by at least 4 hours from: - acid reflux medications - calcium - iron - multivitamins  Please return in 3-4 months with your sugar log.   - we checked her HbA1c: 6.8% (best in many years!) - advised to check sugars at different times of the day - 3x a day, rotating check times - advised for yearly eye exams >> she is UTD - return to clinic in 3-4 months       2. 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  We repeated a neck ultrasound in 02/2020 and this showed stable surgical changes with no residual thyroid abnormalities.  A whole-body scan (03/2020) showed no evidence of local recurrence or metastasis.  Unfortunately, this is very expensive and she still has to pay almost 7000$. -No neck  compression symptoms -We will recheck her thyroglobulin level now and may need a PET scan, however we need to make sure that this is covered by her insurance.  3. Postsurgical hypothyroidism - latest thyroid labs reviewed with pt >> normal: Lab Results  Component Value Date   TSH 0.91 02/13/2020   - she continues on LT4 150 mcg daily - pt feels good on this dose. - we discussed about taking the thyroid hormone every day, with water, >30 minutes before breakfast, separated by >4 hours from acid reflux medications, calcium, iron, multivitamins. Pt. is taking it correctly. - will check thyroid tests today: TSH and fT4 - If labs are abnormal, she will need to return for repeat TFTs in 1.5 months  Component     Latest Ref Rng & Units 08/19/2020  Thyroglobulin     ng/mL 2.0 (L)  TSH     0.35 - 4.50 uIU/mL 0.10 (L)  T4,Free(Direct)     0.60 - 1.60 ng/dL 1.47  Thyroglobulin Ab     < or = 1 IU/mL <1   Thyroglobulin is slightly lower than before and ATA antibodies are undetectable.  Her TSH is suppressed.  We will try to decrease the dose of levothyroxine to 137 mcg daily and repeat her  tests in 1.5 months.  [name], MD PhD [facility] Endocrinology

## 2020-08-19 NOTE — Patient Instructions (Signed)
Please continue: - Tresiba 70 units in am - Regular insulin 30 units before breakfast and 25 units before dinner - Ozempic 0.5 mg weekly in a.m.   Please continue levothyroxine 150 mcg daily.  Take the thyroid hormone every day, with water, at least 30 minutes before breakfast, separated by at least 4 hours from: - acid reflux medications - calcium - iron - multivitamins  Please return in 3-4 months with your sugar log.

## 2020-08-20 LAB — THYROGLOBULIN ANTIBODY: Thyroglobulin Ab: <1 IU/mL (ref <=1)

## 2020-08-20 LAB — THYROGLOBULIN LEVEL: Thyroglobulin: 2 ng/mL — ABNORMAL LOW

## 2020-08-20 MED ORDER — LEVOTHYROXINE SODIUM 137 MCG PO TABS: 137.0000 ug | ORAL_TABLET | Freq: Every day | ORAL | 3 refills | Status: DC

## 2020-08-24 ENCOUNTER — Telehealth: Payer: Self-pay | Admitting: Internal Medicine

## 2020-08-24 NOTE — Telephone Encounter (Signed)
Called and left message advising patient the appointment was scheduled for a [facility] Phone visit and to check the MyChart message for a number to call to confirm appt.

## 2020-08-24 NOTE — Telephone Encounter (Signed)
Patient called stating she got a message on MyChart stating she needed to schedule an appointment but not sure what kind of appointment it was - please advise. I cannot see this message. Not sure if its blood work or OV.

## 2020-08-25 ENCOUNTER — Other Ambulatory Visit: Payer: Self-pay | Admitting: *Deleted

## 2020-08-25 ENCOUNTER — Other Ambulatory Visit: Payer: Self-pay

## 2020-08-25 NOTE — Patient Outreach (Signed)
[place] [facility] [facility]) Care Management  08/25/2020  GEORGINE WILTSE [birth year] [id number]   [facility] Telephone outreach to complex care patient Reassigned to This [facility] RN CM on 03/05/20 [name] Referral Date: 02/20/20 Referral Source:MD referral Referral Reason:02/20/2020 [name], MD: MD referral  Reason for consultDM 2 - cannot afford GLP1 R agonist Diagnoses ofDiabetes  Insurance:blue cross and blue shieldblue Advantage PPO Rx 4 D(affordable care plan)  Outreachsuccessful to her own number Patient is able to verify HIPAA, DOB and address  Follow up  she is doing good  She has been spending time with family  Saw Dr Cruzita Lederer  HgA1c as 6.8 on Ozempic does not have much appetite Eats lots of popcorn for meals Mg down to 137 synthroid  Lost 14 lbs on Ozempic  Discussed TSH - why it would be low She has gout and taking gout medicine for a month Has 2 days left   Consult with [place] staff. Abbey Chatters about TSH concerns and pharmacy/medication assistance for 2022   Will remain with blue cross and blue shieldblue Advantage PPO Rx 4 D(affordable care plan) Now receives an injection in her eye every 6 months go next visit is Thursday 08/27/20  Want 2022 medication assistance applications to get started early  Covid residual-side effects discussed  Dr wants her to start carrying her insulin with her  Discussed insulin protector cases that can be purchased  Plans [facility] RN CM will follow up with Ms Babula within the next 78-75 business days Pt encouraged to return a call to [facility] RN CM prn Goals Addressed              This Visit's Progress     Patient Stated   .  COMPLETED: [facility]) Patient will be able to manage Diabetes at home (pt-stated)   On track      08/25/20 Resolving due to duplicate goal   [place] (see longitudinal plan of care for additional care plan information)  Objective:   Lab Results  Component Value Date   HGBA1C 6.8 (A) 08/19/2020   Lab Results  Component Value Date   CREATININE 1.44 (H) 06/25/2018   CREATININE 1.29 (H) 04/27/2016   CREATININE 1.44 (H) 07/15/2012 .   Marland Kitchen No results found for: EGFR  Current Barriers:  Marland Kitchen Knowledge Deficits related to basic Diabetes pathophysiology and self care/management . Difficulty obtaining or cannot afford medications- Ozempic and Tresiba . Cognitive Deficits  Case Manager Clinical Goal(s):  Over the next 45 days, patient will demonstrate improved adherence to prescribed treatment plan for diabetes self care/management as evidenced by: lower HgA1c value  Goal being met HgA1c 7.8 today 05/19/20  Was 10.7 on 02/13/20, 12.6 on 11/13/19 and 9.8 on 05/13/19 . over the next 90 days patient will reports better home management of diabetes after medication assistance during outreach contacts Goal being met Progression made  . over the next 30 days patient will receive assistance with Ozempic and tresiba prn as evidence by verbalization during further outreach calls- Goal met 05/19/20  . Over the next 100 days patient will continue to work on reducing her weight and reduce use of DM medicines as evidence by weight values decreased and discontinuation of DM medicines per endocrinologist  Interventions:  . Provided education to patient about basic DM disease process . Reviewed medications with patient and discussed importance of medication adherence . Discussed plans with patient for ongoing care management follow up and provided patient with direct contact information for  care management team . Reviewed scheduled/upcoming provider appointments including: pcp and endocrinology . Referral to pharmacy  . Commended her on her recent weight loss of 11 lbs Discussed weight managements options to include consult to dietitian/nutrition/bariatric program for individual meal planning   Patient Self Care Activities:  . Self administers oral  medications as prescribed . Self administers insulin as prescribed . Self administers injectable DM medication (Ozempic, tresiba, regular insulin) as prescribed . Attends all scheduled provider appointments . Checks blood sugars as prescribed and utilize hyper and hypoglycemia protocol as needed . Adheres to prescribed ADA/carb modified  Please see past updates related to this goal by clicking on the "Past Updates" button in the selected goal  Please see other previous [facility] care plan information listed in Epic under the flow sheet section           Aizlynn Digilio L. Lavina Hamman, RN, BSN, [place] Coordinator Office number [phone] Main [facility] number [phone] Fax number [phone]

## 2020-08-27 DIAGNOSIS — E113511 Type 2 diabetes mellitus with proliferative diabetic retinopathy with macular edema, right eye: Secondary | ICD-10-CM | POA: Diagnosis not present

## 2020-09-09 ENCOUNTER — Other Ambulatory Visit: Payer: Self-pay | Admitting: Internal Medicine

## 2020-09-25 ENCOUNTER — Other Ambulatory Visit: Payer: Self-pay | Admitting: *Deleted

## 2020-09-25 ENCOUNTER — Other Ambulatory Visit: Payer: Self-pay

## 2020-09-25 ENCOUNTER — Encounter: Payer: Self-pay | Admitting: *Deleted

## 2020-09-25 NOTE — Patient Outreach (Addendum)
[place] [facility]) Care Management  09/25/2020  EULETA BELSON Aug 26, 1956 [id number]   [facility] Telephone outreach for MD referral/complex care Reassigned to This [facility] RN CM on 03/05/20 [name] Referral Date: 02/20/20 Referral Source:MD referral Referral Reason:02/20/2020 [name], MD: MD referral  Reason for consultDM 2- cannot afford GLP1 R agonist Diagnoses ofDiabetes  Insurance:blue cross and blue shieldblue Advantage PPO Rx4 D(affordable care plan) confirms still has same coverage for 2022  Outreachsuccessful to her own number Patient is able to verify HIPAA, DOB and address  Follow up  Ms Formanek reports she is doing very well  She is out in the community today She reports all her monitored home values are good  Diabetes She states she has been able to identify when she has eaten anything too sweet or a higher carb food with cbg elevations Last HgA1c was 6.8  Reports has not been this good in about 10 years  She was commended for her good work She voiced appreciation   Medication costs  She has not obtained many medications in 2022 yet She reports she is to pick up her synthroid and gout medications today  Everything in December 2022 was $0  Has a coupon already for [place] and her next refill is in February 2022 discussed pending [place] services if begin to have issues in 2022 She and [facility] RN CM reviewed when she will need to re apply for her medication assistance that was initiated in 2021  Her assist ends after June 2022 Sent medication patient assistance applications from needymeds.org for Rx outreach and [employer] via mail   Pt Question during this outreach related to home management of her Novolin R night administration She confirms she is taking her cbg before meals and hs Believes she is having side effects from her HS Novolin R dose when cbg below 100  Ms Sobol  reports she has recently experienced episodes in which she held her night Novolin R when cbgs were below 100.  She is able to identify that when she takes Novolin R with a cbg below 100 at night, she has a side effect of feeling "wobbly the next morning" She reports each time she has these experiences, she eats a good dinner and in the morning her cbg is above 100. She then administers insulin per her action plan. She states she prefers to hold this night Novolin R dose as she does not prefer this side effect She reports she is scheduled to follow up with Dr Cruzita Lederer in "April"  [facility] RN CM discussed with her that if she is having this side effect from her night Novolin R, the needs to be reported to Dr Cruzita Lederer even prior to her next scheduled visit. Discussed she is allowed to call the office to receive home management instructions in between of her appointments.  She states she will call Dr Cruzita Lederer office on 09/28/20    Plans Patient agrees to care plan and follow up within the next 60-75 business days Pt encouraged to return a call to [facility] RN CM prn especially if she has medication cost concerns at her local pharmacy Routed note to MD Goals Addressed              This Visit's Progress     Patient Stated   .  [facility]) Find Help in My Community (pt-stated)   On track     Timeframe:  Short-Term Goal Priority:  Medium Start Date:  09/25/20                    Expected End Date:      12/10/20                 Follow Up Date 11/23/20   - begin a notebook of services in my neighborhood or community - follow-up on any referrals for help I am given - make a list of family or friends that I can call        Notes:     .  ([facility]) Monitor and Manage My Blood Sugar-Diabetes Type 2 (pt-stated)   On track     Timeframe:  Long-Range Goal Priority:  Medium Start Date:          09/25/20                   Expected End Date:          02/09/21             Follow Up Date 11/23/20   - check blood  sugar at prescribed times - check blood sugar if I feel it is too high or too low - enter blood sugar readings and medication or insulin into daily log - take the blood sugar log to all doctor visits      Notes:                    Other   .  Manage My Medicine   On track     Timeframe:  Short-Term Goal Priority:  Medium Start Date:                  09/25/20          Expected End Date:          11/23/20             Follow Up Date 11/23/20   - call for medicine refill 2 or 3 days before it runs out - keep a list of all the medicines I take; vitamins and herbals too     Notes:           Jaymason Ledesma L. Lavina Hamman, RN, BSN, [place] Coordinator Office number [phone] Main [facility] number [phone] Fax number [phone]

## 2020-10-07 ENCOUNTER — Other Ambulatory Visit (INDEPENDENT_AMBULATORY_CARE_PROVIDER_SITE_OTHER): Payer: BC Managed Care – PPO

## 2020-10-07 ENCOUNTER — Other Ambulatory Visit: Payer: Self-pay

## 2020-10-07 DIAGNOSIS — E890 Postprocedural hypothyroidism: Secondary | ICD-10-CM | POA: Diagnosis not present

## 2020-10-07 LAB — TSH: TSH: 1.5 u[IU]/mL (ref 0.35–4.50)

## 2020-10-07 LAB — T4, FREE: Free T4: 1.18 ng/dL (ref 0.60–1.60)

## 2020-10-08 ENCOUNTER — Other Ambulatory Visit: Payer: Self-pay | Admitting: *Deleted

## 2020-10-08 DIAGNOSIS — E113511 Type 2 diabetes mellitus with proliferative diabetic retinopathy with macular edema, right eye: Secondary | ICD-10-CM | POA: Diagnosis not present

## 2020-10-08 NOTE — Patient Outreach (Signed)
[place] [facility] [facility]) Care Management  10/08/2020  KERRIANN KAMPHUIS [birth year] [id number]   [facility] incoming outreach from complex care patient- Medications Reassigned to This [facility] RN CM on 03/05/20 [name] Referral Date: 02/20/20 Referral Source: MD referral  Referral Reason: 02/20/2020   [name], MD: MD referral                                    Reason for consult      DM 2 - cannot afford GLP1 R agonist   Diagnoses of   Diabetes              Insurance: blue cross and blue shield blue Advantage PPO Rx 4 D (affordable care plan) confirms still has same coverage for 2022   Incoming outreach successful Patient is able to verify HIPAA, DOB and address   Update Ms [place] outreach to [facility] RN CM to update that she recently obtained 2 medicines that cost only $9 but her Ozempic discount card has expired She will pick up a new one from Dr Cruzita Lederer office on 10/09/20 and will activate it She is to let [facility] RN CM if there is further cost concern or pharmacy concerns with Ozempic  She confirms receiving the medication assistance applications sent to her from needymeds.org for Rx outreach and [employer] via mail     Plans Patient agrees to care plan and follow up within the next 60-75 business days Pt encouraged to return a call to [place] CM prn especially if she has medication cost concerns at her local pharmacy Routed note to MD  [place]. Lavina Hamman, RN, BSN, [place] Coordinator Office number [phone] Main [facility] number [phone] Fax number [phone]

## 2020-10-09 ENCOUNTER — Telehealth: Payer: Self-pay | Admitting: Internal Medicine

## 2020-10-09 ENCOUNTER — Other Ambulatory Visit: Payer: Self-pay | Admitting: Internal Medicine

## 2020-10-09 ENCOUNTER — Other Ambulatory Visit: Payer: Self-pay | Admitting: *Deleted

## 2020-10-09 NOTE — Telephone Encounter (Signed)
Called and advised Maudie Mercury we requested pt complete patient assistance application as well.

## 2020-10-09 NOTE — Telephone Encounter (Signed)
Kim from [facility] called wanting nurse to know she sent pt ozempic patient assistance paperwork in mail few weeks ago and just referred pt back to [place].Marland Kitchen if theres anything different we did at the office please let her know  Ph# [phone] Maudie Mercury

## 2020-10-09 NOTE — Telephone Encounter (Signed)
Called and spoke with pt and advised there is no generic option to Ozempic. Advised patient assistance would be the best cost effective option for Ozempic. Pt verbalized understanding and will drop off application to be completed and faxed.

## 2020-10-09 NOTE — Telephone Encounter (Signed)
Pt called stating the coupon card Dr gave her still was making her Ozempic over $300 and she cannot afford that. Pt asked if there is any way Dr can do what she did last time by making it $40 or if there is a generic medication that would be cheaper?  PHARMACY:   [facility] [place], [place] - [address] Phone:  [phone]  Fax:  [phone]

## 2020-10-09 NOTE — Telephone Encounter (Signed)
Do you want to prescribe something else?

## 2020-10-09 NOTE — Telephone Encounter (Signed)
T, I see the [facility] already suggested that she apply for patient assistance.  I believe that this is the best way to go forward.  Unfortunately, this medication does not have a corresponding generic.

## 2020-10-09 NOTE — Patient Outreach (Addendum)
[place] [facility]) Care Management  10/09/2020  Courtney [name] 10-28-1955 [id number]   Incoming outreach from [facility] complex care patient - Insulin  [name] called to state her Ozempic card was not of assistance with getting her medications cost down.  She presented the Ozempic card to her [place] and was informed her cost today would be $309 She was instructed to call to the [place] patient assistance program  She called [facility] RN CM to discuss this as she is trying to understand how the cost in 2021 was at $40  [name] discussed the differences in the use of the discount drug cards and drug company patient assistance programs. She voiced understanding  She was encouraged to call the recommended Novo number to provide information to decrease the cost or complete Nova application [name] sent to her in the mail and have Dr Cruzita Lederer sign it She prefer to call the recommended Novo number   Pt later called [name] back after outreaching to [place] and found the outreach not to be beneficial as the staff member discussed with her that a $150 discount could be provided but the cost of the [place] as of 1/28.22 would be $309-150=$159. The patient reports she would not be able to afford $159 [name] completed a conference call with her for an unsuccessful outreach to [name] [phone]   She has left a message at Dr Cruzita Lederer office about this concern  Reviewed the 2022 [facility] cross pharmacy formulary indicating her Ozempic is covered at tier 3, quantity limit with step therapy Discussed annual deductibles She saw the ophthalmologist on  10/08/20 [facility] RN CM Received a call from Dr Arman Filter nurse Junius Creamer to review pt Ozempic concerns. Pt will need to complete the patient assistance application and a [facility] pharmacy referral was ordered. MD office did not complete any other process in 2022 related to [name] RN CM re referred to pharmacy and outreach to Dr Cruzita Lederer office and left  message with Jarrett Soho for MD RN about [facility] pharmacy referral & pt sent Novo application already Follow up with patient within 21-28 business days and collaborate with endocrinology and pharmacy as needed Pt encouraged to return a call to [name] prn Routed note to MD   [place]. Lavina Hamman, RN, BSN, [place] Coordinator Office number [phone] Main [facility] number [phone] Fax number [phone]

## 2020-10-12 DIAGNOSIS — I129 Hypertensive chronic kidney disease with stage 1 through stage 4 chronic kidney disease, or unspecified chronic kidney disease: Secondary | ICD-10-CM | POA: Diagnosis not present

## 2020-10-12 DIAGNOSIS — N184 Chronic kidney disease, stage 4 (severe): Secondary | ICD-10-CM | POA: Insufficient documentation

## 2020-10-12 DIAGNOSIS — D631 Anemia in chronic kidney disease: Secondary | ICD-10-CM | POA: Insufficient documentation

## 2020-10-12 DIAGNOSIS — E1122 Type 2 diabetes mellitus with diabetic chronic kidney disease: Secondary | ICD-10-CM | POA: Diagnosis not present

## 2020-10-12 DIAGNOSIS — N2581 Secondary hyperparathyroidism of renal origin: Secondary | ICD-10-CM | POA: Insufficient documentation

## 2020-10-12 DIAGNOSIS — N189 Chronic kidney disease, unspecified: Secondary | ICD-10-CM | POA: Insufficient documentation

## 2020-10-12 DIAGNOSIS — R809 Proteinuria, unspecified: Secondary | ICD-10-CM | POA: Diagnosis not present

## 2020-10-12 NOTE — Patient Outreach (Signed)
[place] [name]) Care Management  10/12/2020  Courtney [name] 17-Dec-1955 [id number]  Referral for medication assistance from Jackelyn Poling, RN sent to [place].  Ina Homes [facility] Management Assistant [phone]

## 2020-10-14 ENCOUNTER — Telehealth: Payer: Self-pay | Admitting: Internal Medicine

## 2020-10-14 NOTE — Telephone Encounter (Signed)
Patient called to advise that she is out of Ozempic and that Patient Assistance paperwork is being completed.  Patient is requesting samples of Ozempic to cover the gap until she receives her medication  Call back # [phone]

## 2020-10-16 NOTE — Telephone Encounter (Signed)
Yes, OK 

## 2020-10-16 NOTE — Telephone Encounter (Signed)
Called and advised pt sample Ozempic is available at the office. Pt advised she will be in today to pick up.

## 2020-10-16 NOTE — Telephone Encounter (Signed)
Ok to provider a sample?

## 2020-10-26 DIAGNOSIS — E890 Postprocedural hypothyroidism: Secondary | ICD-10-CM | POA: Diagnosis not present

## 2020-10-26 DIAGNOSIS — M109 Gout, unspecified: Secondary | ICD-10-CM | POA: Diagnosis not present

## 2020-10-26 DIAGNOSIS — E1121 Type 2 diabetes mellitus with diabetic nephropathy: Secondary | ICD-10-CM | POA: Diagnosis not present

## 2020-10-26 DIAGNOSIS — I10 Essential (primary) hypertension: Secondary | ICD-10-CM | POA: Diagnosis not present

## 2020-10-26 DIAGNOSIS — E785 Hyperlipidemia, unspecified: Secondary | ICD-10-CM | POA: Diagnosis not present

## 2020-11-23 ENCOUNTER — Other Ambulatory Visit: Payer: Self-pay

## 2020-11-23 ENCOUNTER — Telehealth: Payer: Self-pay | Admitting: Internal Medicine

## 2020-11-23 ENCOUNTER — Other Ambulatory Visit: Payer: Self-pay | Admitting: *Deleted

## 2020-11-23 NOTE — Telephone Encounter (Signed)
Patient calling to see if Dr Cruzita Lederer can give her a sample of the [place]. Her call back number is [phone]

## 2020-11-23 NOTE — Telephone Encounter (Signed)
Called and left a message for pt advising 1 sample box of Ozempic is labeled for her to pick up.

## 2020-11-23 NOTE — Patient Outreach (Signed)
[name]) Care Management  11/23/2020  Courtney [name] 1956-08-11 [id number]   [facility] complex care patient Reassigned to This [facility] RN CM on 03/05/20 [place] [name]) Referral Date: 02/20/20 Referral Source:MD referral Referral Reason:02/20/2020 Courtney Kingdom, MD: MD referral  Reason for consultDM 2- cannot afford GLP1 R agonist Diagnoses ofDiabetes  Insurance:blue cross and blue shieldblue Advantage PPO Rx4 D(affordable careplan)confirms still has same coverage for 2022  [facility] RN CM left a voice message for pt  She returned the call to her to review medication issues  She was connected with her providers to get assistance with Ozempic and her beginning of the year deductible was discussed    Plans Patient agrees to care plan and follow upwithin the next 60-75 business days Pt encouraged to return a call to [place]. Lavina Hamman, RN, BSN, [place] Coordinator Office number [phone] Main [facility] number [phone] Fax number [phone]'

## 2020-12-08 ENCOUNTER — Other Ambulatory Visit: Payer: Self-pay | Admitting: Internal Medicine

## 2020-12-09 NOTE — Telephone Encounter (Signed)
Pt was calling to check the status of her refill. She asks if it could please be sent in today so she only has to make 1 trip to the [place].

## 2020-12-14 ENCOUNTER — Encounter: Payer: Self-pay | Admitting: *Deleted

## 2020-12-14 ENCOUNTER — Other Ambulatory Visit: Payer: Self-pay | Admitting: *Deleted

## 2020-12-14 NOTE — Patient Outreach (Signed)
[name]) Care Management  12/14/2020  Courtney [name] 1956-02-16 [id number]   [place] coordination- pharmacy assistance, management  Message from patient stating she is having issues obtaining her Tyler Aas for this month of April 2022 She stated she has left a message with [facility] pharmacist on Saturday 12/12/20 Courtney [name] is not available during weekends  [facility] RN CM returned a call the patient Patient is able to verify HIPAA ([place] and Accountability Act) identifiers Reviewed and addressed the purpose of the follow up call with the patient  Consent: [employer] [name] ([place]) RN CM reviewed [facility] services with patient. Patient gave verbal consent for services.  Patient explains she worked with the pcp and Courtney [name] and will be getting assist with her Ozempic but her Tyler Aas manufacturer discount card that allowed her to pay $25 in March 2022 now indicates $99 at her local pharmacy even after discarding an expired card and activating a new card  She reports going to the pharmacy x 3 since last Thursday and getting assistance from 2 different pharmacy providers to include a pharmacy staff who was successful in the past with accurately running the discount card and insurance card She has not had Courtney [name] since Friday 12/11/20 and has had some elevation of cbgs in 200's She does having regular insulin also at the home  She has made attempts to obtain this medicine for a lower cost  She has made various calls to [employer], and [place] cross blue shield staff with transfers and disconnections with survey, etc   [facility] RN CM interventions with patient via conference calls Outreach to Pharmacist line [phone] listed on scan BCBS insurance card in [facility] Staff here informed pt she was not able to speak with patient. "the member" Line disconnected Outreach to [phone] [employer] RN reports this is the [employer] number. Larene [place] assisted to  transfer to the [place] benefit vendor  Transferred to [place] benefit vendor who transferred to [facility] customer service office at [phone] and encouraged patient connection with prompts to member or medical services not pharmacy  [place] blue cross and blue shield agent, [place] at [name] customer service office at [phone].listened to pt's concern. Pt reviewed her tresiba concern of Tresiba flex cost elevation since March 2022 with an activated discount card  He informed her that her pharmacy deductible of $800.00 is of concern.  Remaining deductible as of today is [phone] He confirms the pt has a $161.00 remaining pharmacy deductible before the $40 Tyler Aas co pay can be effective monthly. He recommends she pay the $161.00 or $99 that will decrease the $161. BCBS does not have a program to assist with the payment of co pays or deductibles. Pt reports not being able to afford to pay the $99 Courtney [name] cost for April 2022. She reports most of her income is used to pay on outstanding bills  Outreach to [employer] [phone]) initial without patient but then had to conference in patient as Courtney [name] began to try to explain why the Courtney [name] discount card will only cover Tresiba 1-35 ml at $99 Courtney [name] discussed that if the co pay for tresiba is more than $175 with insurance and the discount card is also submitted it may trigger the cost to continue to be $99 Pt phone disconnected With return call to [employer] after getting patient back on the phone, Courtney [name] was reach. She  reviewed the importance of the patient verifying that her pharmacy is running [employer] saving card and the insurance  Courtney [name] reports noting in February 2022 both cards were run resulting in the $25 charge Courtney [name] is requesting patient have her pharmacy staff speak with Courtney [name] pharmacy help desk at [phone]. The patient is to offer this number to her local pharmacy  [facility] RN CM used teach  back method to have patient repeat what was discussed in the [place]. [facility] RN CM discussed with patient that Courtney [name] does not have a program to assist with paying co pays and deductibles. She was encouraged to possible seek assistance from family, friends, church, Data processing manager. She voiced understanding  Attempt to reach Courtney [name] office at [phone] without success in the last five minutes of the work day. Pt to outreach to MD office on 12/15/20  [facility] RN CM sent an epic in basket message to Courtney [name] about this concerns with attempts for resolution  Sent note to [place] RN CM will outreach to pt prn and within the next 60-75 business days Pt encouraged to return a call to [facility] RN CM prn Goals Addressed              This Visit's Progress     Patient Stated   .  Courtney [name]) Find Help in My Community (pt-stated)   On track     Timeframe:  Short-Term Goal Priority:  Medium Start Date:         09/25/20                    Expected End Date:      12/10/20                 Follow Up Date 01/15/21   - begin a notebook of services in my neighborhood or community - follow-up on any referrals for help I am given - make a list of family or friends that I can call    Notes: 12/14/20 has a list of resource information for her Courtney [name] for her diabetic medications. Follow up on medication cost concerns for Tyler Aas encouraged to possible seek assistance from family, friends, church, Data processing manager. She voiced understanding     .  ([facility]) Monitor and Manage My Blood Sugar-Diabetes Type 2 (pt-stated)   On track     Timeframe:  Long-Range Goal Priority:  Medium Start Date:          09/25/20                   Expected End Date:          02/09/21             Follow Up Date 01/15/21   - check blood sugar at prescribed times - check blood sugar if I feel it is too high or too low - enter blood sugar readings and medication or insulin into daily log - take the blood  sugar log to all doctor visits    Notes:  12/14/20 pt checking cbgs at home, outreaches to endocrinology prn,       Other   .  Manage My Medicine   On track     Timeframe:  Short-Term Goal Priority:  Medium Start Date:                  09/25/20  Expected End Date:          11/23/20             Follow Up Date 01/15/21   - call for medicine refill 2 or 3 days before it runs out - keep a list of all the medicines I take; vitamins and herbals too     Notes: 12/14/20 has a list of resource information for her [place] for her diabetic medications. Follow up on medication cost concerns for Tyler Aas encouraged to possible seek assistance from family, friends, church, Data processing manager. She voiced understanding       Levetta Bognar L. Lavina Hamman, RN, BSN, [place] Coordinator Office number [phone] Mobile number [phone]  Main [facility] number [phone] Fax number [phone]

## 2020-12-15 ENCOUNTER — Telehealth: Payer: Self-pay | Admitting: Internal Medicine

## 2020-12-15 NOTE — Telephone Encounter (Signed)
For long acting insulin we have Toujeo and [place].

## 2020-12-15 NOTE — Telephone Encounter (Signed)
We can give her Toujeo but please tell her that this is slightly weaker than [place] so she should start at the same dose but may need to go slightly higher by few units.

## 2020-12-15 NOTE — Telephone Encounter (Addendum)
Pt advised sample boxes (2) of Toujeo are labeled for her to pick up. Advised insulin is slightly weaker and may require additional units. Pt verbalized understanding.

## 2020-12-15 NOTE — Telephone Encounter (Signed)
Pt is having issues receiving her Courtney [name] and wants to know if we have any samples until she can figure things out with her insurance?  Callback# [phone]

## 2020-12-16 NOTE — Telephone Encounter (Signed)
Patient picked up sample boxes (2) of Toujeo on 12/16/20 @ 1:11 PM

## 2020-12-23 ENCOUNTER — Encounter: Payer: Self-pay | Admitting: Internal Medicine

## 2020-12-23 ENCOUNTER — Ambulatory Visit (INDEPENDENT_AMBULATORY_CARE_PROVIDER_SITE_OTHER): Payer: BC Managed Care – PPO | Admitting: Internal Medicine

## 2020-12-23 ENCOUNTER — Ambulatory Visit: Payer: BC Managed Care – PPO | Admitting: Internal Medicine

## 2020-12-23 ENCOUNTER — Other Ambulatory Visit: Payer: Self-pay

## 2020-12-23 VITALS — BP 128/78 | HR 85 | Ht 67.0 in | Wt 262.0 lb

## 2020-12-23 DIAGNOSIS — E1121 Type 2 diabetes mellitus with diabetic nephropathy: Secondary | ICD-10-CM

## 2020-12-23 DIAGNOSIS — C73 Malignant neoplasm of thyroid gland: Secondary | ICD-10-CM | POA: Diagnosis not present

## 2020-12-23 DIAGNOSIS — E1165 Type 2 diabetes mellitus with hyperglycemia: Secondary | ICD-10-CM

## 2020-12-23 DIAGNOSIS — Z794 Long term (current) use of insulin: Secondary | ICD-10-CM

## 2020-12-23 DIAGNOSIS — IMO0002 Reserved for concepts with insufficient information to code with codable children: Secondary | ICD-10-CM

## 2020-12-23 DIAGNOSIS — E890 Postprocedural hypothyroidism: Secondary | ICD-10-CM | POA: Diagnosis not present

## 2020-12-23 LAB — POCT GLYCOSYLATED HEMOGLOBIN (HGB A1C): Hemoglobin A1C: 6.9 % — AB (ref 4.0–5.6)

## 2020-12-23 NOTE — Progress Notes (Signed)
Patient ID: Courtney [name], female   DOB: 1955/11/28, 65 y.o.   MRN: [id number]  This visit occurred during the SARS-CoV-2 public health emergency.  Safety protocols were in place, including screening questions prior to the visit, additional usage of staff PPE, and extensive cleaning of exam room while observing appropriate contact time as indicated for disinfecting solutions.   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 Last visit 4 months ago.  Interim history: She relies on samples now for Toujeo and Ozempic due to the high deductible.  She was switched to Medicare in 6 months. She has no complaints at this visit other than insomnia - wakes up ~2-4 am and cannot go back to sleep.  DM2: Reviewed HbA1c levels: Lab Results  Component Value Date   HGBA1C 6.8 (A) 08/19/2020   HGBA1C 7.8 (A) 05/19/2020   HGBA1C 10.7 (A) 02/13/2020   HGBA1C 12.6 (A) 11/13/2019   HGBA1C 9.8 (A) 05/13/2019   HGBA1C 13.7 (A) 10/23/2018   HGBA1C 10.1 (A) 06/25/2018   HGBA1C 8.6 (A) 02/14/2018   HGBA1C 8.3 11/13/2017   HGBA1C 9.1 08/16/2017   HGBA1C 11.3 05/31/2017   HGBA1C 11.9 02/24/2017   HGBA1C 9.5 11/25/2016   HGBA1C 8.2 (H) 04/13/2016   HGBA1C 9.1 02/05/2016   HGBA1C 9.8 (H) 09/18/2015   HGBA1C 9.9 06/19/2015   HGBA1C 11.0 03/27/2015   HGBA1C 10.9 (H) 12/26/2014  08/22/2016: HbA1c 11.8% 08/01/2014: HbA1c 14.5% 05/02/2014: HbA1c 14.8% 01/31/2014: HbA1c 13.6% 11/01/2013: HbA1c 15.1%  She was on: - Tresiba U200 70 units in am - ReliOn Novolin to 2-3x a day: - before breakfast and dinner:  20 units with a regular meal  25 units before a larger meal  (15 units before lunch if you eat lunch) - not using Stopped Metformin 500 mg 2x a day - b/c leg swelling. She was on Novolin 70/30 48 units  2x a day 15 min before the meals  She was on Amaryl 8 mg in am She was on Metformin >> stopped 2/2 CKD.  Then on: (May forget insulin doses) Insulin Before breakfast Before lunch Before dinner  Regular 40 10 (if you eat) 40  NPH 40  30  Please take only 25 units of R insulin if sugars before meals <80. If sugars < or = 60 before meals, skip R insulin with that meal. If you skip a meal, check the sugars and, if high, may need to tke 50% of the insulin dose anyway. If you skip a meal, you still need to take the N insulin.  She is currently on: - Tresiba >> Toujeo 70 units in a.m. - Regular insulin 30 units before breakfast and 30 >> 25 units before dinner - Ozempic 0.5 mg weekly in a.m.   She checks sugars twice a day: - am:  76-325 >> 97-130s, 207 >> 89-133, 146-186 if forgets R before dinner >> 78, 123-140, 179, 183 (if eats late) - 2h after b'fast: n/c - before lunch: 75, 113, 165 >> 114, 192, 229 >> n/c >> 135 >> 120-140 - 2h after lunch: n/c >> 67, 228 >> n/c - before dinner:140-150 >> 70-271 >> 85-130 >> 63, 75-124, 150 >> 120-130 - 2h after dinner: n/c >> 131, 269 >> n/c - bedtime: n/c >> 240s >> n/c >> 287 >> n/c > 108, 168 >> n/c -  nighttime: n/c  Lowest sugar was 70 >> 85 >> 63 >> 78; it is unclear at which level she has hypoglycemia Highest sugar was 325 >> 207 >> 196 >> 204.  Glucometer: Prodigy >> ReliOn  Pt's meals are: - Breakfast: chicken  - Lunch: peanuts, sandwich + crystal lite drink - Dinner: frozen dinner - Snacks: fruit, small bag potato chips, peanuts She was drinking sweet tea and sodas in the past and I strongly advised her to stop.  -+ Stage III CKD; BUN/creatinine:  04/27/2020: CMP normal with the exception of glucose 136, BUN/creatinine 22/2.18, GFR 27, calcium 8.6 (8.7-10.3), alkaline phosphatase 145 (48-121), ALT 33 (0-32), Vitamin D 23.5. In PCPs note, it was mentioned that she was lost for follow-up with nephrology and she was referred back to  [facility]. 10/28/2019: Glucose 96, BUN/creatinine 12/1.47, GFR 43 Lab Results  Component Value Date   BUN 21 06/25/2018   Lab Results  Component Value Date   CREATININE 1.44 (H) 06/25/2018  08/22/2016: BUN/creatinine 20/1.41 03/18/2016: BUN/creatinine 16/1.21, EGFR 57, Calcium 8.7, AST/ALT 17/24 09/18/2015: BUN/creatinine 14/1.2, GFR 57 (previously 55) 08/01/2014: 15/1.26, GFR 55 On Cozaar.  -+ HL; lipids: 04/27/2020: 130/121/69/40 10/28/2019: 166/88/91/60 Lab Results  Component Value Date   CHOL 134 06/25/2018   HDL 70.20 06/25/2018   LDLCALC 54 06/25/2018   TRIG 47.0 06/25/2018   CHOLHDL 2 06/25/2018  03/18/2016: Lipids: 156/85/70/69 09/18/2015: 160/57/87/62 08/01/2014: 145/78/74/55 On lovastatin 20.  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  She had left eye cataract surgery.  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  - She did not have RAI treatment at that time, however, she was found to have a thyroid mass in 2005 >> This was biopsied and returned as normal thyroid tissue, but had RAI treatment at that time.  - The posttreatment whole-body scan was negative for any cancer spread.   Thyroglobulin returned at 2.3 in 08/2014 >> I ordered a neck ultrasound that showed a stable nodule with a fatty hilum, consistent with a benign lymph node, but no other masses in the surgical bed.   The thyroglobulin level remained detectable, and was actually 9.9 in 10/2015.   A neck ultrasound (11/2015) showed a stable 1.4 nodule in the thyroid bed, consistent with a benign process   Her thyroglobulin levels continue to be  detectable  A neck ultrasound (02/2020) was negative for metastasis or recurrences  A whole-body scan (03/2020) was also negative for abnormal uptake  Reviewed her thyroglobulin and ATA antibodies: Lab Results  Component Value Date   THYROGLB 2.0 (L) 08/19/2020   THYROGLB 3.3 02/13/2020   THYROGLB 2.1 (L) 05/13/2019   THYROGLB 2.5 (L) 06/25/2018   THYROGLB 1.8 (L) 08/15/2017   THYROGLB 2.5 (L) 08/29/2016   THYROGLB 9.9 11/06/2015   THYROGLB 2.4 (L) 09/18/2015   THYROGLB 0.8 (L) 12/26/2014   THYROGLB 2.3 (L) 08/22/2014   THGAB <1 08/19/2020   THGAB <1 02/13/2020   THGAB <1 05/13/2019   THGAB <1 06/25/2018   THGAB <1 08/15/2017   THGAB <1 08/29/2016   THGAB <1 09/18/2015   THGAB <1.0 12/26/2014   THGAB <1 08/22/2014   THGAB <  1.0 08/22/2014   Postsurgical hypothyroidism:  Reviewed her TFTs: Lab Results  Component Value Date   TSH 1.50 10/07/2020   TSH 0.10 (L) 08/19/2020   TSH 0.91 02/13/2020   TSH 0.89 05/13/2019   TSH 1.39 06/25/2018   FREET4 1.18 10/07/2020   FREET4 1.47 08/19/2020   FREET4 1.37 02/13/2020   FREET4 1.24 05/13/2019   FREET4 0.99 06/25/2018  10/28/2019: TSH 1.6 08/01/2014: TSH 1.89  Pt is on levothyroxine 137 mcg daily (decreased from 150 mcg 08/2020), taken: - in am - fasting - at least 30 min from b'fast - no calcium - no iron - no multivitamins - no PPIs - not on Biotin  No FH of thyroid cancer. No h/o radiation tx to head or neck other than RAI treatment.  No herbal supplements. No Biotin use. No recent steroids use.   Pt denies: - feeling nodules in neck - hoarseness - dysphagia - choking - SOB with lying down  Other labs reviewed prior records from PCP: 10/28/2019: Vitamin D 39.2  ROS: Constitutional: + weight gain/no weight loss, no fatigue, no subjective hyperthermia, no subjective hypothermia Eyes: no blurry vision, no xerophthalmia ENT: no sore throat, + see HPI Cardiovascular: no CP/no SOB/no palpitations/no leg  swelling Respiratory: no cough/no SOB/no wheezing Gastrointestinal: no N/no V/no D/no C/no acid reflux Musculoskeletal: no muscle aches/no joint aches Skin: no rashes, no hair loss Neurological: no tremors/+ numbness/+ tingling/no dizziness  I reviewed pt's medications, allergies, PMH, social hx, family hx, and changes were documented in the history of present illness. Otherwise, unchanged from my initial visit note.  Past Medical History:  Diagnosis Date  . Anemia   . Arthritis   . Chronic kidney disease   . Diabetes mellitus without complication ([place])   . Hyperlipidemia   . Hypertension   . MRSA infection 2013   on leg; surgery   . Neuropathy due to secondary diabetes ([place])   . Thyroid disease    Past Surgical History:  Procedure Laterality Date  . BILATERAL CARPAL TUNNEL RELEASE Bilateral 2003  . CATARACT EXTRACTION W/ INTRAOCULAR LENS IMPLANT Left 2016  . CHOLECYSTECTOMY    . COLONOSCOPY WITH PROPOFOL N/A 05/14/2020   Procedure: COLONOSCOPY WITH PROPOFOL;  Surgeon: Lin Landsman, MD;  Location: [facility] ENDOSCOPY;  Service: Gastroenterology;  Laterality: N/A;  . 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 EYE SURGERY    . KNEE ARTHROSCOPY Left 05/05/2016   Procedure: ARTHROSCOPY KNEE;  Surgeon: Thornton Park, MD;  Location: [facility] ORS;  Service: Orthopedics;  Laterality: Left;  . SHOULDER ARTHROSCOPY Left 2004  . THYROID SURGERY  2004  . TRIGGER FINGER RELEASE  2003   History   Social History Main Topics  . Smoking status: Former Research scientist (life sciences)  . Smokeless tobacco: Not on file  . Alcohol Use: No  . Drug Use: No   Social History Narrative   Single   0 children   [place] (travels regularly)      Beer and wine on occasion   First menstrual cycle: 6th grade   Postmenopausal      Current Outpatient Medications  Medication Sig Dispense Refill  . Acetaminophen 325 MG CAPS Take by mouth.    Marland Kitchen allopurinol (ZYLOPRIM) 100 MG tablet Take 100 mg by mouth daily.    Marland Kitchen  amLODipine (NORVASC) 10 MG tablet Take 10 mg by mouth daily. Reported on 11/06/2015    . Cholecalciferol 125 MCG (5000 UT) capsule Take by mouth.    . diphenhydrAMINE (BENADRYL)  50 MG tablet Take by mouth.    . ergocalciferol (VITAMIN D2) 1.25 MG (50000 UT) capsule Vitamin D2 1,250 mcg (50,000 unit) capsule    . hydrochlorothiazide (HYDRODIURIL) 25 MG tablet Take 25 mg by mouth daily.     . insulin regular (NOVOLIN R RELION) 100 units/mL injection INJECT 30 UNITS SUB-Q 3 TIMES DAILY BEFORE MEALS. ReliOn insulin. 20 mL 11  . Insulin Syringe-Needle U-100 31G X 15/64" 0.5 ML MISC Use 3x a day 100 each 11  . levothyroxine (SYNTHROID) 137 MCG tablet Take 1 tablet (137 mcg total) by mouth daily before breakfast. 45 tablet 3  . losartan (COZAAR) 100 MG tablet Take 100 mg by mouth daily.     Marland Kitchen lovastatin (MEVACOR) 20 MG tablet Take 20 mg by mouth at bedtime.     Marland Kitchen ON/GO COVID-19 ANTIGEN TEST KIT See admin instructions.    . polyethylene glycol-electrolytes (NULYTELY) 420 g solution Take by mouth once.    . Semaglutide,0.25 or 0.5MG/DOS, (OZEMPIC, 0.25 OR 0.5 MG/DOSE,) 2 MG/1.5ML SOPN Inject 0.375 mLs (0.5 mg total) into the skin once a week. 2 pen 5  . torsemide (DEMADEX) 20 MG tablet Take 20 mg by mouth daily.    . TRESIBA FLEXTOUCH 200 UNIT/ML FlexTouch Pen INJECT 70 UNITS SUBCUTANEOUSLY ONCE DAILY 9 mL 0  . triamterene-hydrochlorothiazide (DYAZIDE) 37.5-25 MG capsule Take 1 capsule by mouth every morning.     No current facility-administered medications for this visit.   NKDA   Family History  Problem Relation Age of Onset  . Hypertension Mother   . Thyroid disease Mother   . CVA Mother   . Hypertension Sister   . Thyroid disease Sister   . Hypertension Brother   . Hyperlipidemia Brother    PE: BP 128/78 (BP Location: Right Arm, Patient Position: Sitting, Cuff Size: Normal)   Pulse 85   Ht '5\' 7"'  (1.702 m)   Wt 262 lb (118.8 kg)   SpO2 98%   BMI 41.04 kg/m  Body mass index is 41.04  kg/m. Wt Readings from Last 3 Encounters:  12/23/20 262 lb (118.8 kg)  08/19/20 260 lb 3.2 oz (118 kg)  05/19/20 263 lb (119.3 kg)   Constitutional: overweight, in NAD Eyes: PERRLA, EOMI, no exophthalmos ENT: moist mucous membranes, no neck masses palpated, thyroidectomy scar healed, no cervical lymphadenopathy Cardiovascular: RRR, No MRG Respiratory: CTA B Gastrointestinal: abdomen soft, NT, ND, BS+ Musculoskeletal: no deformities, strength intact in all 4 Skin: moist, warm, no rashes Neurological: no tremor with outstretched hands, DTR normal in all 4  ASSESSMENT: 1. DM2, insulin-dependent, uncontrolled, with complications - CKD - DR  She does not have a family history of medullary thyroid cancer a personal history of pancreatitis.  2. 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 She did not have RAI treatment at that time, however, she was found to have a thyroid mass in 2005. This was biopsied and returned as normal thyroid tissue.She had RAI treatment at that time. The posttreatment whole-body scan was negative for any cancers.  - Neck U/S (09/19/2014): 1. Post total thyroidectomy. 2. No change in the previously biopsied approximately 1.4 cm echogenic solid nodule within the inferior aspect of the right lobe of the thyroid, grossly unchanged since the 2005 examination. 3. Apparent development of an approximately 0.4 cm hypoechoic nodule within the right thyroidectomy bed -  while too small for definitive characterization, this nodule appears to contain an echogenic hilum and thus is favored to represent a non pathologically enlarged cervical lymph node.  - Neck U/S (11/27/2015):  1. Surgical changes of prior total thyroidectomy. 2. Unchanged 1.4 cm echogenic soft tissue nodule in the right thyroid resection  bed. Continued stability over time consistent with a benign process.  - Neck U/S (03/02/2020): Stable surgical changes following total thyroidectomy. No residual thyroid bed abnormality.  - WBS (03/27/2020): her copay: 6800$!!  No evidence of local thyroid cancer recurrence or distant metastasis by I 131 imaging.  3. Post surgical hypothyroidism  PLAN:  1. Patient with longstanding, uncontrolled, type 2 diabetes, on basal-bolus insulin regimen and GLP-1 receptor agonist.  This was finally approved for her in 02/2020 and her sugars improved significantly after starting it.  We were also able to switch from [place] to [place] after she changed her insurance to [employer].  At last visit, HbA1c was 6.8%, the best she had in many years!  At that time, sugars were at goal in the morning with 3 exceptions when she forgot to take her regular insulin before dinner.  She was not taking her insulin with her when eating out and I advised her to try to do so.  We did not change her regimen otherwise. -At this visit, sugars remain at goal, except the occasionally when she has a later dinner, when sugars are high in the morning.  She is not checking sugars after dinner and I strongly advised her to start.  For now, I did advise her to take slightly more regular insulin before dinner if she needs to eat late. -Otherwise, I do not feel that she needs a change in regimen.  At next visit, we may need to increase Ozempic to 1 mg weekly.  However, she is now relying on samples so we will continue the 0.5 mg weekly.  She tolerates this well. - I advised her to: Patient Instructions  Please continue: - Toujeo 70 units in am - Ozempic 0.5 mg weekly in a.m.   Please use: - Regular insulin 30 units before breakfast and 25-28 units before dinner  Please continue levothyroxine 150 mcg daily.  Take the thyroid hormone every day, with water, at least 30 minutes before breakfast, separated by at least 4 hours  from: - acid reflux medications - calcium - iron - multivitamins  Please return in 3-4 months with your sugar log.   - we checked her HbA1c: 6.9% (slightly higher) - advised to check sugars at different times of the day - 3x a day, rotating check times - advised for yearly eye exams >> she is UTD - return to clinic in 3-4 months       2. 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  A repeat neck ultrasound in 02/2020 showed stable surgical changes with no residual thyroid abnormalities.  Whole-body scan in 03/2020 showed no evidence of local recurrence or metastasis.  Unfortunately, this was very expensive and she is had to pay approximately $7000. -At this visit, she has no neck compression symptoms -We will repeat her thyroglobulin level at next visit and we discussed that we  may need a PET scan afterwards, however, we need to make sure first that this is covered by her insurance  3. Postsurgical hypothyroidism - latest thyroid labs reviewed with pt >> normal: Lab Results  Component Value Date   TSH 1.50 10/07/2020   - she continues on LT4 137 mcg daily (dose decreased at last visit) - pt feels good on this dose. - we discussed about taking the thyroid hormone every day, with water, >30 minutes before breakfast, separated by >4 hours from acid reflux medications, calcium, iron, multivitamins. Pt. is taking it correctly. - will check thyroid tests at next OV  [name], MD PhD [facility] Endocrinology

## 2020-12-23 NOTE — Patient Instructions (Addendum)
Please continue: - Toujeo 70 units in am - Ozempic 0.5 mg weekly in a.m.   Please use: - Regular insulin 30 units before breakfast and 25-28 units before dinner  Please continue levothyroxine 150 mcg daily.  Take the thyroid hormone every day, with water, at least 30 minutes before breakfast, separated by at least 4 hours from: - acid reflux medications - calcium - iron - multivitamins  Please return in 3-4 months with your sugar log.

## 2021-01-11 ENCOUNTER — Telehealth: Payer: Self-pay | Admitting: Internal Medicine

## 2021-01-11 DIAGNOSIS — N2581 Secondary hyperparathyroidism of renal origin: Secondary | ICD-10-CM | POA: Diagnosis not present

## 2021-01-11 DIAGNOSIS — D631 Anemia in chronic kidney disease: Secondary | ICD-10-CM | POA: Diagnosis not present

## 2021-01-11 DIAGNOSIS — N184 Chronic kidney disease, stage 4 (severe): Secondary | ICD-10-CM | POA: Diagnosis not present

## 2021-01-11 DIAGNOSIS — I129 Hypertensive chronic kidney disease with stage 1 through stage 4 chronic kidney disease, or unspecified chronic kidney disease: Secondary | ICD-10-CM | POA: Diagnosis not present

## 2021-01-11 NOTE — Telephone Encounter (Signed)
Patient called and is requesting sample of Ozempic - please call her back @ [phone]

## 2021-01-11 NOTE — Telephone Encounter (Signed)
Called pt back informed her that we were given the ok to give her samples this time. Gave pt the option to fill out pt assistance forms for ozempic if cost is too expensive but pt declined.

## 2021-01-12 NOTE — Telephone Encounter (Signed)
Patient came into office yesterday to pick up samples but none were in the refrigerator,etc. Patient advised that she believes that she will be coming back Friday to pick up

## 2021-01-13 DIAGNOSIS — E113511 Type 2 diabetes mellitus with proliferative diabetic retinopathy with macular edema, right eye: Secondary | ICD-10-CM | POA: Diagnosis not present

## 2021-01-13 NOTE — Telephone Encounter (Signed)
Samples are in refrigerator with pt label

## 2021-01-15 ENCOUNTER — Other Ambulatory Visit: Payer: Self-pay | Admitting: *Deleted

## 2021-01-15 ENCOUNTER — Other Ambulatory Visit: Payer: Self-pay

## 2021-01-15 NOTE — Patient Outreach (Signed)
[facility]) Care Management  01/15/2021  STEPHANEE BARCOMB [birth year] [id number]  [facility] complex care patient Reassigned to This [facility] RN CM on 03/05/20 [name] Referral Date: 02/20/20 Referral Source:MD referral Referral Reason:02/20/2020 [name], MD: MD referral  Reason for consultDM 2- cannot afford GLP1 R agonist Diagnoses ofDiabetes  Insurance:blue cross and blue shieldblue Advantage PPO Rx4 D(affordable careplan)confirms still has same coverage for 2022  Follow up  She reports her MDs including [place] and Dr Cruzita Lederer have been assisting with samples of her medications   She confirms she has 2 months of Ozempic, Tresiba Also was put on Trejulo  Medication changes may need to occur to assist until June 24 2021 when she turns 33 [facility] RN CM encouraged community resources like family, churches, etc [facility] RN CM clarified that [facility] is not able to assist with annual insurance deductibles,  medication and MD co pays  Reports no changes in home management of diet and exercise Nephrology put her on farxiga and informed nephrology would not be able to assist with medications that are not nephrology related  Saw eye dr and received an injection as the pressure in right eye was  increased   Fell hurt knee, hip years ago and continue to have intermittent symptoms  Reports noting some pain in her back but denies injury nor changes in urine color Only pain  Discussed difference in kidney pain and pain related to muscle movement She voiced understanding  Encouraged better management of food intake  Plans Patient agrees to care plan and follow up within the next 100 business days Pt encouraged to return a call to [place] CM prn  Goals Addressed              This Visit's Progress     Patient Stated   .  COMPLETED: [facility]) Find Help in My Community (pt-stated)   On track     Timeframe:   Short-Term Goal Priority:  Medium Start Date:         09/25/20                    Expected End Date:      12/10/20                    - begin a notebook of services in my neighborhood or community - follow-up on any referrals for help I am given - make a list of family or friends that I can call    Why is this important?    Knowing how and where to find help for yourself or family in your neighborhood and community is an important skill.   You will want to take some steps to learn how.    Notes: Goal met 01/15/21 Pt getting assistance from her MDs and drug companies with medication costs She follows up when referred She was encouraged to seek assistance from family, friends, churches, etc 12/14/20 has a list of resource information for her [place] for her diabetic medications. Follow up on medication cost concerns for Tyler Aas encouraged to possible seek assistance from family, friends, church, Data processing manager. She voiced understanding     .  [facility]) Monitor and Manage My Blood Sugar-Diabetes Type 2 (pt-stated)   On track     Timeframe:  Long-Range Goal Priority:  Medium Start Date:          09/25/20  Expected End Date:          02/09/21             Follow Up Date 04/30/21   - check blood sugar at prescribed times - check blood sugar if I feel it is too high or too low - enter blood sugar readings and medication or insulin into daily log - take the blood sugar log to all doctor visits     Notes:  01/15/21 Reports she continues to manage cbg values well at home and continues to get assist with medications from her MDs 12/14/20 pt checking cbgs at home, outreaches to endocrinology prn,          Giovanne Nickolson L. Lavina Hamman, RN, BSN, [place] Coordinator Office number [phone] Main [facility] number [phone] Fax number [phone]

## 2021-01-20 ENCOUNTER — Other Ambulatory Visit: Payer: Self-pay | Admitting: Internal Medicine

## 2021-02-19 DIAGNOSIS — E113511 Type 2 diabetes mellitus with proliferative diabetic retinopathy with macular edema, right eye: Secondary | ICD-10-CM | POA: Diagnosis not present

## 2021-02-23 ENCOUNTER — Other Ambulatory Visit: Payer: Self-pay | Admitting: Internal Medicine

## 2021-03-02 ENCOUNTER — Other Ambulatory Visit: Payer: Self-pay | Admitting: Internal Medicine

## 2021-03-04 NOTE — Telephone Encounter (Signed)
MEDICATION:  TRESIBA FLEXTOUCH 200 UNIT/ML FlexTouch Pen  PHARMACY:   [place], [place] - [address] Phone:  [phone]  Fax:  [phone]      HAS THE PATIENT CONTACTED THEIR PHARMACY?  Yes-Patient states PHARM told Patient they sent request for the above listed medication a couple of days ago with no response and to call Dr. Cruzita Lederer  IS THIS A 90 DAY SUPPLY : No  IS PATIENT OUT OF MEDICATION: Almost-Patient states she is on her last pen  IF NOT; HOW MUCH IS LEFT: Approx. 3 days  LAST APPOINTMENT DATE: @6 /14/2022  NEXT APPOINTMENT DATE:@8 /15/2022  DO WE HAVE YOUR PERMISSION TO LEAVE A DETAILED MESSAGE?: Yes  OTHER COMMENTS:    **Let patient know to contact pharmacy at the end of the day to make sure medication is ready. **  ** Please notify patient to allow 48-72 hours to process**  **Encourage patient to contact the pharmacy for refills or they can request refills through [facility]**

## 2021-04-02 ENCOUNTER — Other Ambulatory Visit: Payer: Self-pay | Admitting: Internal Medicine

## 2021-04-02 DIAGNOSIS — Z6841 Body Mass Index (BMI) 40.0 and over, adult: Secondary | ICD-10-CM | POA: Diagnosis not present

## 2021-04-02 DIAGNOSIS — K0889 Other specified disorders of teeth and supporting structures: Secondary | ICD-10-CM | POA: Diagnosis not present

## 2021-04-20 DIAGNOSIS — D631 Anemia in chronic kidney disease: Secondary | ICD-10-CM | POA: Diagnosis not present

## 2021-04-20 DIAGNOSIS — N2581 Secondary hyperparathyroidism of renal origin: Secondary | ICD-10-CM | POA: Diagnosis not present

## 2021-04-20 DIAGNOSIS — E1122 Type 2 diabetes mellitus with diabetic chronic kidney disease: Secondary | ICD-10-CM | POA: Diagnosis not present

## 2021-04-20 DIAGNOSIS — I129 Hypertensive chronic kidney disease with stage 1 through stage 4 chronic kidney disease, or unspecified chronic kidney disease: Secondary | ICD-10-CM | POA: Diagnosis not present

## 2021-04-20 DIAGNOSIS — N1832 Chronic kidney disease, stage 3b: Secondary | ICD-10-CM | POA: Diagnosis not present

## 2021-04-23 DIAGNOSIS — E113513 Type 2 diabetes mellitus with proliferative diabetic retinopathy with macular edema, bilateral: Secondary | ICD-10-CM | POA: Diagnosis not present

## 2021-04-26 ENCOUNTER — Encounter: Payer: Self-pay | Admitting: Internal Medicine

## 2021-04-26 ENCOUNTER — Other Ambulatory Visit: Payer: Self-pay

## 2021-04-26 ENCOUNTER — Ambulatory Visit: Payer: BC Managed Care – PPO | Admitting: Internal Medicine

## 2021-04-26 VITALS — BP 122/80 | HR 80 | Ht 67.0 in | Wt 258.4 lb

## 2021-04-26 DIAGNOSIS — E1165 Type 2 diabetes mellitus with hyperglycemia: Secondary | ICD-10-CM

## 2021-04-26 DIAGNOSIS — C73 Malignant neoplasm of thyroid gland: Secondary | ICD-10-CM

## 2021-04-26 DIAGNOSIS — E890 Postprocedural hypothyroidism: Secondary | ICD-10-CM | POA: Diagnosis not present

## 2021-04-26 DIAGNOSIS — IMO0002 Reserved for concepts with insufficient information to code with codable children: Secondary | ICD-10-CM

## 2021-04-26 DIAGNOSIS — E1121 Type 2 diabetes mellitus with diabetic nephropathy: Secondary | ICD-10-CM

## 2021-04-26 DIAGNOSIS — Z794 Long term (current) use of insulin: Secondary | ICD-10-CM

## 2021-04-26 LAB — T4, FREE: Free T4: 1.39 ng/dL (ref 0.60–1.60)

## 2021-04-26 LAB — POCT GLYCOSYLATED HEMOGLOBIN (HGB A1C): Hemoglobin A1C: 7 % — AB (ref 4.0–5.6)

## 2021-04-26 LAB — TSH: TSH: 0.27 u[IU]/mL — ABNORMAL LOW (ref 0.35–5.50)

## 2021-04-26 MED ORDER — INSULIN REGULAR HUMAN 100 UNIT/ML IJ SOLN: INTRAMUSCULAR | 11 refills | Status: DC

## 2021-04-26 NOTE — Patient Instructions (Addendum)
Please continue: - Farxiga 5 mg before b'fast - Ozempic 0.5 mg weekly in a.m.  - Toujeo 70 units in am - Regular insulin 30 units before breakfast and 10-15 units before  larger dinner  Please continue levothyroxine 137 mcg daily.  Take the thyroid hormone every day, with water, at least 30 minutes before breakfast, separated by at least 4 hours from: - acid reflux medications - calcium - iron - multivitamins  Please stop at the lab.  Please return in 4 months with your sugar log.

## 2021-04-26 NOTE — Progress Notes (Addendum)
Patient ID: Courtney [name], female   DOB: 02-Sep-1956, 65 y.o.   MRN: [id number]  This visit occurred during the SARS-CoV-2 public health emergency.  Safety protocols were in place, including screening questions prior to the visit, additional usage of staff PPE, and extensive cleaning of exam room while observing appropriate contact time as indicated for disinfecting solutions.   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 Last visit 4 months ago.  Interim history: She was switched to Medicare in 2 months.  She had to rely on samples for Toujeo and Ozempic due to the high deductible. She has increased urination (after she was started on Farxiga by her nephrologist), but denies blurry vision, nausea, chest pain.  DM2: Reviewed HbA1c levels: Lab Results  Component Value Date   HGBA1C 6.9 (A) 12/23/2020   HGBA1C 6.8 (A) 08/19/2020   HGBA1C 7.8 (A) 05/19/2020   HGBA1C 10.7 (A) 02/13/2020   HGBA1C 12.6 (A) 11/13/2019   HGBA1C 9.8 (A) 05/13/2019   HGBA1C 13.7 (A) 10/23/2018   HGBA1C 10.1 (A) 06/25/2018   HGBA1C 8.6 (A) 02/14/2018   HGBA1C 8.3 11/13/2017   HGBA1C 9.1 08/16/2017   HGBA1C 11.3 05/31/2017   HGBA1C 11.9 02/24/2017   HGBA1C 9.5 11/25/2016   HGBA1C 8.2 (H) 04/13/2016   HGBA1C 9.1 02/05/2016   HGBA1C 9.8 (H) 09/18/2015   HGBA1C 9.9 06/19/2015   HGBA1C 11.0 03/27/2015   HGBA1C 10.9 (H) 12/26/2014  08/22/2016: HbA1c 11.8% 08/01/2014: HbA1c 14.5% 05/02/2014: HbA1c 14.8% 01/31/2014: HbA1c 13.6% 11/01/2013: HbA1c 15.1%  She was on: - Tresiba U200 70 units in am - ReliOn Novolin to 2-3x a day: - before breakfast and dinner: 20 units with a regular meal 25 units before a larger meal (15 units before lunch if you eat lunch) - not using Stopped Metformin 500 mg 2x a day -  b/c leg swelling. She was on Novolin 70/30 48 units 2x a day 15 min before the meals  She was on Amaryl 8 mg in am She was on Metformin >> stopped 2/2 CKD.  Then on: (May forget insulin doses) Insulin Before breakfast Before lunch Before dinner  Regular 40 10 (if you eat) 40  NPH 40  30  Please take only 25 units of R insulin if sugars before meals <80. If sugars < or = 60 before meals, skip R insulin with that meal. If you skip a meal, check the sugars and, if high, may need to tke 50% of the insulin dose anyway. If you skip a meal, you still need to take the N insulin.  She is currently on: - Tresiba >> Toujeo 70 units in a.m. - Regular insulin 30 units before breakfast  - stopped 2/2 constipation (?) - Ozempic 0.5 mg weekly in a.m.  - Farxiga 5 mg daily - started by Dr. Juleen China 01/2021  She checks sugars twice a day: - am:  89-133, 146-186 if forgets R before dinner >> 78, 123-140, 179, 183 >> 93-159, 169 - 2h after b'fast: n/c - before lunch: 75, 113, 165 >> 114, 192, 229 >> n/c >> 135 >> 120-140 >> 101, 106 - 2h after lunch: n/c >> 67, 228 >> n/c - before dinner: 70-271 >> 85-130 >> 63, 75-124, 150 >> 120-130 >> 75-118 - 2h after dinner: n/c >> 131, 269 >> n/c - bedtime: n/c >>  240s >> n/c >> 287 >> n/c > 108, 168 >> n/c >> 131 - nighttime: n/c  Lowest sugar was 70 >> 85 >> 63 >> 78 >> 75; it is unclear at which level she has hypoglycemia Highest sugar was 325 >> 207 >> 196 >> 204 >> 169.  Glucometer: Prodigy >> ReliOn  Pt's meals are: - Breakfast: chicken  - Lunch: peanuts, sandwich + crystal lite drink - Dinner: frozen dinner - Snacks: fruit, small bag potato chips, peanuts She was drinking sweet tea and sodas in the past and I strongly advised her to stop.  -+ Stage III CKD; BUN/creatinine:  04/20/2021: 18/1.65 04/27/2020: CMP normal with the exception of glucose 136, BUN/creatinine 22/2.18, GFR 27, calcium 8.6 (8.7-10.3), alkaline phosphatase 145 (48-121), ALT 33  (0-32), Vitamin D 23.5. In PCPs note, it was mentioned that she was lost for follow-up with nephrology and she was referred back to [facility]. 10/28/2019: Glucose 96, BUN/creatinine 12/1.47, GFR 43 Lab Results  Component Value Date   BUN 21 06/25/2018   Lab Results  Component Value Date   CREATININE 1.44 (H) 06/25/2018  08/22/2016: BUN/creatinine 20/1.41 03/18/2016: BUN/creatinine 16/1.21, EGFR 57, Calcium 8.7, AST/ALT 17/24 09/18/2015: BUN/creatinine 14/1.2, GFR 57 (previously 55) 08/01/2014: 15/1.26, GFR 55 On Cozaar.  -+ HL; lipids: 04/27/2020: 130/121/69/40 10/28/2019: 166/88/91/60 Lab Results  Component Value Date   CHOL 134 06/25/2018   HDL 70.20 06/25/2018   LDLCALC 54 06/25/2018   TRIG 47.0 06/25/2018   CHOLHDL 2 06/25/2018  03/18/2016: Lipids: 156/85/70/69 09/18/2015: 160/57/87/62 08/01/2014: 145/78/74/55 On lovastatin 20.  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  She had left eye cataract surgery.  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  - She did not have RAI treatment at that time, however, she was found to have a thyroid mass in 2005 >> This was biopsied and returned as normal thyroid tissue, but had RAI treatment at that time.  - The posttreatment whole-body scan was negative for any cancer spread.   Thyroglobulin returned at 2.3 in 08/2014 >> I ordered a neck ultrasound that showed a stable nodule with a fatty hilum, consistent with a benign lymph node, but no other masses in the surgical bed.   The thyroglobulin level remained detectable, and was actually 9.9 in 10/2015.   A neck ultrasound (11/2015) showed  a stable 1.4 nodule in the thyroid bed, consistent with a benign process   Her thyroglobulin levels continue to be detectable  A neck ultrasound (02/2020) was negative for metastasis or recurrences  A whole-body scan (03/2020) was also negative for abnormal uptake  Reviewed her thyroglobulin and ATA antibodies: Lab Results  Component Value Date   THYROGLB 2.0 (L) 08/19/2020   THYROGLB 3.3 02/13/2020   THYROGLB 2.1 (L) 05/13/2019   THYROGLB 2.5 (L) 06/25/2018   THYROGLB 1.8 (L) 08/15/2017   THYROGLB 2.5 (L) 08/29/2016   THYROGLB 9.9 11/06/2015   THYROGLB 2.4 (L) 09/18/2015   THYROGLB 0.8 (L) 12/26/2014   THYROGLB 2.3 (L) 08/22/2014   THGAB <1 08/19/2020   THGAB <1 02/13/2020   THGAB <1 05/13/2019   THGAB <1 06/25/2018   THGAB <1 08/15/2017  THGAB <1 08/29/2016   THGAB <1 09/18/2015   THGAB <1.0 12/26/2014   THGAB <1 08/22/2014   THGAB <1.0 08/22/2014   Postsurgical hypothyroidism:  Reviewed her TFTs: Lab Results  Component Value Date   TSH 1.50 10/07/2020   TSH 0.10 (L) 08/19/2020   TSH 0.91 02/13/2020   TSH 0.89 05/13/2019   TSH 1.39 06/25/2018   FREET4 1.18 10/07/2020   FREET4 1.47 08/19/2020   FREET4 1.37 02/13/2020   FREET4 1.24 05/13/2019   FREET4 0.99 06/25/2018  10/28/2019: TSH 1.6 08/01/2014: TSH 1.89  Pt is on levothyroxine 137 mcg daily (decreased from 150 mcg 08/2020), taken: - in am - fasting - at least 30 min from b'fast - no calcium - no iron - no multivitamins - no PPIs - not on Biotin  No FH of thyroid cancer. No h/o radiation tx to head or neck other than RAI treatment.  No herbal supplements. No Biotin use. No recent steroids use.   Pt denies: - feeling nodules in neck - hoarseness - dysphagia - choking - SOB with lying down  Other labs reviewed prior records from PCP: 10/28/2019: Vitamin D 39.2  ROS: Constitutional: no weight gain/+ weight loss, no fatigue, no subjective hyperthermia, no subjective hypothermia Eyes: no  blurry vision, no xerophthalmia ENT: no sore throat, + see HPI Cardiovascular: no CP/no SOB/no palpitations/no leg swelling Respiratory: no cough/no SOB/no wheezing Gastrointestinal: no N/no V/no D/+ C-improved/no acid reflux Musculoskeletal: no muscle aches/no joint aches Skin: no rashes, no hair loss Neurological: no tremors/+ numbness/+ tingling/no dizziness  I reviewed pt's medications, allergies, PMH, social hx, family hx, and changes were documented in the history of present illness. Otherwise, unchanged from my initial visit note.  Past Medical History:  Diagnosis Date   Anemia    Arthritis    Chronic kidney disease    Diabetes mellitus without complication ([place])    Hyperlipidemia    Hypertension    MRSA infection 2013   on leg; surgery    Neuropathy due to secondary diabetes [facility])    Thyroid disease    Past Surgical History:  Procedure Laterality Date   BILATERAL CARPAL TUNNEL RELEASE Bilateral 2003   CATARACT EXTRACTION W/ INTRAOCULAR LENS IMPLANT Left 2016   CHOLECYSTECTOMY     COLONOSCOPY WITH PROPOFOL N/A 05/14/2020   Procedure: COLONOSCOPY WITH PROPOFOL;  Surgeon: Lin Landsman, MD;  Location: [facility] ENDOSCOPY;  Service: Gastroenterology;  Laterality: N/A;   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   EYE SURGERY     KNEE ARTHROSCOPY Left 05/05/2016   Procedure: ARTHROSCOPY KNEE;  Surgeon: Thornton Park, MD;  Location: [facility] ORS;  Service: Orthopedics;  Laterality: Left;   SHOULDER ARTHROSCOPY Left 2004   THYROID SURGERY  2004   TRIGGER FINGER RELEASE  2003   History   Social History Main Topics   Smoking status: Former Smoker   Smokeless tobacco: Not on file   Alcohol Use: No   Drug Use: No   Social History Narrative   Single   0 children   [place] (travels regularly)      Beer and wine on occasion   First menstrual cycle: 6th grade   Postmenopausal      Current Outpatient Medications  Medication Sig Dispense Refill   Acetaminophen 325 MG  CAPS Take by mouth.     allopurinol (ZYLOPRIM) 100 MG tablet Take 100 mg by mouth daily.     amLODipine (NORVASC) 10 MG tablet Take 10 mg by mouth daily. Reported  on 11/06/2015     Cholecalciferol 125 MCG (5000 UT) capsule Take by mouth.     diphenhydrAMINE (BENADRYL) 50 MG tablet Take by mouth.     ergocalciferol (VITAMIN D2) 1.25 MG (50000 UT) capsule Vitamin D2 1,250 mcg (50,000 unit) capsule     EUTHYROX 137 MCG tablet TAKE 1 TABLET BY MOUTH ONCE DAILY BEFORE BREAKFAST 90 tablet 0   hydrochlorothiazide (HYDRODIURIL) 25 MG tablet Take 25 mg by mouth daily.      insulin regular (NOVOLIN R RELION) 100 units/mL injection INJECT 30 UNITS SUB-Q 3 TIMES DAILY BEFORE MEALS. ReliOn insulin. 20 mL 11   Insulin Syringe-Needle U-100 31G X 15/64" 0.5 ML MISC Use 3x a day 100 each 11   losartan (COZAAR) 100 MG tablet Take 100 mg by mouth daily.      lovastatin (MEVACOR) 20 MG tablet Take 20 mg by mouth at bedtime.      ON/GO COVID-19 ANTIGEN TEST KIT See admin instructions.     OZEMPIC, 0.25 OR 0.5 MG/DOSE, 2 MG/1.5ML SOPN INJECT 0.$RemoveBefor'5MG'ujSVHEpfCFRX$  UNDER THE SKIN ONCE A WEEK 2 mL 0   polyethylene glycol-electrolytes (NULYTELY) 420 g solution Take by mouth once.     torsemide (DEMADEX) 20 MG tablet Take 20 mg by mouth daily.     TRESIBA FLEXTOUCH 200 UNIT/ML FlexTouch Pen INJECT 70 UNITS SUBCUTANEOUSLY ONCE DAILY 15 mL 0   triamterene-hydrochlorothiazide (DYAZIDE) 37.5-25 MG capsule Take 1 capsule by mouth every morning.     No current facility-administered medications for this visit.   NKDA   Family History  Problem Relation Age of Onset   Hypertension Mother    Thyroid disease Mother    CVA Mother    Hypertension Sister    Thyroid disease Sister    Hypertension Brother    Hyperlipidemia Brother    PE: BP 122/80 (BP Location: Right Arm, Patient Position: Sitting, Cuff Size: Normal)   Pulse 80   Ht $R'5\' 7"'Wy$  (1.702 m)   Wt 258 lb 6.4 oz (117.2 kg)   SpO2 97%   BMI 40.47 kg/m  Body mass index is 40.47  kg/m. Wt Readings from Last 3 Encounters:  04/26/21 258 lb 6.4 oz (117.2 kg)  12/23/20 262 lb (118.8 kg)  08/19/20 260 lb 3.2 oz (118 kg)   Constitutional: overweight, in NAD Eyes: PERRLA, EOMI, no exophthalmos ENT: moist mucous membranes, no neck masses palpated, thyroidectomy scar healed, no cervical lymphadenopathy Cardiovascular: RRR, No MRG Respiratory: CTA B Gastrointestinal: abdomen soft, NT, ND, BS+ Musculoskeletal: no deformities, strength intact in all 4 Skin: moist, warm, no rashes Neurological: no tremor with outstretched hands, DTR normal in all 4  ASSESSMENT: 1. DM2, insulin-dependent, uncontrolled, with complications - CKD - DR  She does not have a family history of medullary thyroid cancer a personal history of pancreatitis.  2. 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 She did not have RAI treatment at that time, however, she was found to have a thyroid mass in 2005. This was biopsied and returned as normal thyroid tissue.She had RAI treatment at that time. The posttreatment whole-body scan was negative for any cancers.  - Neck U/S (09/19/2014): 1. Post total thyroidectomy. 2. No change in the previously biopsied approximately 1.4 cm echogenic solid nodule within the inferior aspect of the right lobe of the thyroid, grossly unchanged since the 2005 examination.  3. Apparent development of an approximately 0.4 cm hypoechoic nodule within the right thyroidectomy bed - while too small for definitive characterization, this nodule appears to contain an echogenic hilum and thus is favored to represent a non pathologically enlarged cervical lymph node.  - Neck U/S (11/27/2015):  1. Surgical changes of prior total thyroidectomy. 2. Unchanged 1.4 cm echogenic soft tissue nodule in the right thyroid  resection bed. Continued stability over time consistent with a benign process.  - Neck U/S (03/02/2020): Stable surgical changes following total thyroidectomy. No residual thyroid bed abnormality.  - WBS (03/27/2020): her copay: 6800$!!  No evidence of local thyroid cancer recurrence or distant metastasis by I 131 imaging.   3. Post surgical hypothyroidism  PLAN:  1. Patient with longstanding, uncontrolled, type 2 diabetes, on basal-bolus insulin regimen and GLP-1 receptor agonist.  This was finally approved for her in 02/2020 and her sugars improved significantly after starting it.  We were also able to switch from NPH to [place] after she changed her insurance to [employer].  HbA1c was 6.9% at last visit, only slightly increased from before, but still at goal.  Sugars also remained at goal, except for the occasional situation when she had a later dinner, when sugars are high in the morning.  She was not checking sugars after dinner and I advised her to start.  We did discuss that she may need a slightly higher dose of regular insulin before a late dinner.  We did not change her regimen otherwise. -However, approximately 3 months ago, Dr. Juleen China added Wilder Glade.  She tolerates this well, but does have increased urination and also developed constipation.  -At today's visit, sugars are mostly at goal, but she occasionally has higher blood sugars in the morning, in the 140s to 150s.  These happen usually after having a larger dinner the night before and especially after she stopped her regular insulin before dinner.  She mentions that she stopped this due to constipation.  I did discuss with her that the constipation is most likely related to [place] or Ozempic, but she feels that this has improved after stopping the evening dose of insulin.  Since her sugars are mostly at goal, I advised her to continue without this evening insulin dose but to add a lower dose if she has a larger dinner. - I  advised her to: Patient Instructions  Please continue: - Farxiga 5 mg before b'fast - Ozempic 0.5 mg weekly in a.m.  - Toujeo 70 units in am - Regular insulin 30 units before breakfast and 10-15 units before  larger dinner  Please continue levothyroxine 137 mcg daily.  Take the thyroid hormone every day, with water, at least 30 minutes before breakfast, separated by at least 4 hours from: - acid reflux medications - calcium - iron - multivitamins  Please stop at the lab.  Please return in 4 months with your sugar log.   - we checked her HbA1c: 7.0% (higher) - advised to check sugars at different times of the day - 3-4x a day, rotating check times - advised for yearly eye exams >> she is not UTD - return to clinic in 3-4 months       2. 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  Latest level was 2.0, slightly lower.  ATA antibodies are undetectable. -I reviewed the results of  her neck ultrasound from 11/2015, which showed a stable mass, consistent with a benign process.  Repeat neck ultrasound in 02/2020 showed stable surgical changes with no residual thyroid abnormalities.  A whole-body scan in 03/2020 showed no evidence of local recurrence or metastasis.  This was a very expensive test for her and she had to pay approximately $7000 for it.  -She has no neck compression symptoms at this visit -We will repeat her thyroglobulin level now.  She may need a PET scan afterwards, however, we first need to make sure that this is covered by her insurance  3. Postsurgical hypothyroidism - latest thyroid labs reviewed with pt. >> normal: Lab Results  Component Value Date   TSH 1.50 10/07/2020  - she continues on LT4 137 mcg daily - pt feels good on this dose. - we discussed about taking the thyroid hormone  every day, with water, >30 minutes before breakfast, separated by >4 hours from acid reflux medications, calcium, iron, multivitamins. Pt. is taking it correctly. - will check thyroid tests today: TSH and fT4 - If labs are abnormal, she will need to return for repeat TFTs in 1.5 months  Needs refills.   Component     Latest Ref Rng & Units 04/26/2021  Thyroglobulin     ng/mL 2.3 (L)  T4,Free(Direct)     0.60 - 1.60 ng/dL 1.39  TSH     0.35 - 5.50 uIU/mL 0.27 (L)  Thyroglobulin Ab     < or = 1 IU/mL <1   Thyroglobulin level is detectable and only slightly higher than before, but overall, stable since at least 2017.  Antithyroglobulin antibodies are undetectable.  TSH is slightly low, but due to previous normal TSH on this dose of levothyroxine and due to her detectable thyroglobulin, I would suggest for now to continue on the same dose.  I will recheck her TFTs at next visit.  [name], MD PhD [facility]

## 2021-04-27 LAB — THYROGLOBULIN LEVEL: Thyroglobulin: 2.3 ng/mL — ABNORMAL LOW

## 2021-04-27 LAB — THYROGLOBULIN ANTIBODY: Thyroglobulin Ab: <1 IU/mL (ref <=1)

## 2021-04-28 ENCOUNTER — Other Ambulatory Visit: Payer: Self-pay | Admitting: Internal Medicine

## 2021-04-28 DIAGNOSIS — IMO0002 Reserved for concepts with insufficient information to code with codable children: Secondary | ICD-10-CM

## 2021-04-28 DIAGNOSIS — E1165 Type 2 diabetes mellitus with hyperglycemia: Secondary | ICD-10-CM

## 2021-04-28 MED ORDER — LEVOTHYROXINE SODIUM 137 MCG PO TABS: 137.0000 ug | ORAL_TABLET | Freq: Every day | ORAL | 3 refills | Status: DC

## 2021-04-29 DIAGNOSIS — N1832 Chronic kidney disease, stage 3b: Secondary | ICD-10-CM | POA: Diagnosis not present

## 2021-04-29 DIAGNOSIS — E785 Hyperlipidemia, unspecified: Secondary | ICD-10-CM | POA: Diagnosis not present

## 2021-04-29 DIAGNOSIS — Z23 Encounter for immunization: Secondary | ICD-10-CM | POA: Diagnosis not present

## 2021-04-29 DIAGNOSIS — E890 Postprocedural hypothyroidism: Secondary | ICD-10-CM | POA: Diagnosis not present

## 2021-04-29 DIAGNOSIS — Z0000 Encounter for general adult medical examination without abnormal findings: Secondary | ICD-10-CM | POA: Diagnosis not present

## 2021-04-30 ENCOUNTER — Other Ambulatory Visit: Payer: Self-pay | Admitting: *Deleted

## 2021-04-30 ENCOUNTER — Other Ambulatory Visit: Payer: Self-pay

## 2021-04-30 NOTE — Patient Outreach (Addendum)
[employer] [facility]) Care Management  04/30/2021  Courtney [name] Dec 10, 1955 [id number]    [facility] complex care patient Courtney [name] was reassigned to This [facility] RN CM on 03/05/20 [name] Initial referral date: 02/20/20 for MD referral  by [name], MD for DM 2 - cannot afford GLP1 R agonist  Diagnoses of   Diabetes              Insurance: blue cross and blue shield blue Advantage PPO Rx 4 D (affordable care plan)    Courtney [name] is able to verify HIPAA identifiers  Follow up Assessment Courtney [name] reports she is doing very well today She reports follow up with her endocrinologist with a slight elevation in her HgA1c (remains in 7 range) but continues to monitor She continues to be knowledgeable of home management of her diabetes and is outreaching to her MD office more for worsening symptoms and advice She reports her endocrinologist is aware of her not taking 25 units of Novolin at night related voiced issues with constipation She reports she was informed by her MD she is to take the Novolin if she eats a large meal at night  Her TSH labs decreased to 0.27 from 1.50 with MD monitoring. This occurred also in the last year   She denies medication cost concerns at this time  She is scheduled to be insured by medicare in October 2022 (turns 63). She has worked with a Wellsite geologist and completed forms for supplemental security income (SSI) medication assistance. She states she was approved    Chronic Kidney disease (CKD) She is aware of her 34% kidney functioning since starting  farxiga She reports her nephrologist states it is helping  She has experienced some low potassium levels lows but reports her MD is monitoring  She reports she "always have cramps" (hx of cramps) She reports she notices more cramps when she drinks pepsi or coke  RN CM reviewed foods high in potassium and she reports only liking bananas, potato, seafood from list   Sent her EMMI education  via e-mail for diabetes and diet,high potassium diet, TSH, CKD   Plan Patient agrees to care plan and follow up within the next 72 business days  Goals Addressed               This Visit's Progress     Patient Stated     [facility]) Monitor and Manage My Blood Sugar-Diabetes Type 2 (pt-stated)   On track     Timeframe:  Long-Range Goal Priority:  Medium Start Date:          09/25/20                   Expected End Date:          07/12/21             Follow Up Date 07/12/21 Barriers: Knowledge    - check blood sugar at prescribed times - check blood sugar if I feel it is too high or too low - enter blood sugar readings and medication or insulin into daily log - take the blood sugar log to all doctor visits    Notes:  04/30/21 reports slight increase in HgA1c but doing well saw endocrinologist 01/15/21 Reports she continues to manage cbg values well at home and continues to get assist with medications from her MDs 12/14/20 pt checking cbgs at home, outreaches to endocrinology prn,  Follow My Treatment Plan-Chronic [facility]) (pt-stated)   On track     Timeframe:  Long-Range Goal Priority:  Medium Start Date:                            04/30/21 Expected End Date:                08/11/21       Follow Up Date 07/12/21 Barriers: Knowledge    - ask for help if I can't afford my medicines - call for medicine refill 2 or 3 days before it runs out - call the doctor or nurse before I stop taking medicine - keep follow-up appointments     Notes:  04/30/21 aware of her 34% kidney functioning since starting  farxiga She reports her nephrologist states it is helping. Taking medicines as ordered, has experienced some low potassium levels lows but reports her MD is monitoring , reports she "always have cramps" (hx of cramps)- more when take in coke or pepsi States she will attempt to increase potassium enriched foods reviewed      Manage My Medicine [facility]) (pt-stated)   On track      Timeframe:  Long-Range Goal Priority:  Medium Start Date:                  09/25/20          Expected End Date:          07/12/21              Barriers: Financial Knowledge    - call for medicine refill 2 or 3 days before it runs out - keep a list of all the medicines I take; vitamins and herbals too   Notes:  04/30/21 doing better with medicine management, pending medicare coverage in October 2022 has worked with SSI to get approved for medication assistance 12/14/20 has a list of resource information for her [employer] and [employer] for her diabetic medications. Follow up on medication cost concerns for Tyler Aas encouraged to possible seek assistance from family, friends, church, Data processing manager. She voiced understanding          Donnovan Stamour L. Lavina Hamman, RN, BSN, [place] Coordinator Office number [phone] [facility] number [phone] Fax number [phone]

## 2021-05-10 DIAGNOSIS — E113513 Type 2 diabetes mellitus with proliferative diabetic retinopathy with macular edema, bilateral: Secondary | ICD-10-CM | POA: Diagnosis not present

## 2021-05-11 ENCOUNTER — Other Ambulatory Visit: Payer: Self-pay | Admitting: Nurse Practitioner

## 2021-05-11 ENCOUNTER — Telehealth: Payer: Self-pay | Admitting: Internal Medicine

## 2021-05-11 DIAGNOSIS — Z1231 Encounter for screening mammogram for malignant neoplasm of breast: Secondary | ICD-10-CM

## 2021-05-11 NOTE — Telephone Encounter (Signed)
Pt is needing a call back from the nurse regarding her insurance-medicare. Pt medicare is wanting a substitute for ozempic. So insurance will pay for it

## 2021-05-12 NOTE — Telephone Encounter (Signed)
Called and advised pt to contact insurance to get list of covered alternatives to Ozempic. Pt should call back with requested information.

## 2021-05-26 ENCOUNTER — Other Ambulatory Visit: Payer: Self-pay | Admitting: Internal Medicine

## 2021-05-26 DIAGNOSIS — E1165 Type 2 diabetes mellitus with hyperglycemia: Secondary | ICD-10-CM

## 2021-05-26 DIAGNOSIS — IMO0002 Reserved for concepts with insufficient information to code with codable children: Secondary | ICD-10-CM

## 2021-05-27 NOTE — Telephone Encounter (Signed)
T, If insurance covers [place], I would definitely prefer her to be on this.  If not, we will need to refill Toujeo.  Also, I see that she called about Ozempic not being affordable.  We can send this again to see if that change but if not we may need to change to Trulicity or go through the patient assistance program.

## 2021-06-23 ENCOUNTER — Ambulatory Visit
Admission: RE | Admit: 2021-06-23 | Discharge: 2021-06-23 | Disposition: A | Discharge status: Home / Self Care | Payer: Medicare HMO | Source: Ambulatory Visit | Attending: Nurse Practitioner | Admitting: Nurse Practitioner

## 2021-06-23 ENCOUNTER — Other Ambulatory Visit: Payer: Self-pay

## 2021-06-23 DIAGNOSIS — Z1231 Encounter for screening mammogram for malignant neoplasm of breast: Secondary | ICD-10-CM | POA: Diagnosis not present

## 2021-06-25 DIAGNOSIS — N183 Chronic kidney disease, stage 3 unspecified: Secondary | ICD-10-CM | POA: Diagnosis not present

## 2021-06-25 DIAGNOSIS — E890 Postprocedural hypothyroidism: Secondary | ICD-10-CM | POA: Diagnosis not present

## 2021-06-25 DIAGNOSIS — Z6841 Body Mass Index (BMI) 40.0 and over, adult: Secondary | ICD-10-CM | POA: Diagnosis not present

## 2021-06-25 DIAGNOSIS — N950 Postmenopausal bleeding: Secondary | ICD-10-CM | POA: Diagnosis not present

## 2021-06-25 DIAGNOSIS — R5383 Other fatigue: Secondary | ICD-10-CM | POA: Diagnosis not present

## 2021-06-28 DIAGNOSIS — E113593 Type 2 diabetes mellitus with proliferative diabetic retinopathy without macular edema, bilateral: Secondary | ICD-10-CM | POA: Diagnosis not present

## 2021-07-12 ENCOUNTER — Other Ambulatory Visit: Payer: Self-pay | Admitting: *Deleted

## 2021-07-12 ENCOUNTER — Other Ambulatory Visit: Payer: Self-pay

## 2021-07-12 NOTE — Patient Outreach (Signed)
[facility]) Care Management  07/12/2021  Courtney [name] 1955-10-26 [id number]   [facility] [facility] complex care patient [name] was reassigned to [facility] RN CM on 03/05/20 [name] Initial referral date: 02/20/20 for MD referral  by [name], MD for DM 2 - cannot afford GLP1 R agonist  Diagnoses of   Diabetes              Insurance: blue cross and blue shield blue Advantage PPO Rx 4 D (affordable care plan)  07/12/21 she confirmed her coverage became active      [name] is able to verify HIPAA identifiers   Follow up Assessment [name] reports she is doing fair She reports issues with fatigue "feels like I am walking sideways" She denies vertigo, chest pain, chest fluttering She is not seen by a cardiologist   Has an appointment with Dr Lynnda Shields Obstetrics/Gynecology [phone] for history of irregular menses Discussed Seasonal affective disorder (SAD) She states her lab shows she is not anemic and she takes vitamin D She has discussed with her endocrinology Vitamin B 12 management  Discussed the importance of following MD recommendations and getting labs prior to taking over the counter (OTC) products Follows up in February 2023 with pcp  Sleep apnea review sign and symptoms She confirms she does experience excessive daytime sleepiness and snoring and had previously had some headaches in the mornings She reports irritation is "normal for me"  Diabetes  cbg 124 07/11/21 134 Not taking second insulin shot in the evenings  RN CM assisted patient to outreach to Dr Elgie Congo [phone]  Plan Patient agrees to care plan and follow up within the next 17 business days  Courtney [name] L. Courtney Hamman, RN, BSN, [place] Coordinator Office number [phone] [facility] number [phone] Fax number [phone]

## 2021-07-30 ENCOUNTER — Ambulatory Visit (INDEPENDENT_AMBULATORY_CARE_PROVIDER_SITE_OTHER): Payer: Medicare HMO | Admitting: Obstetrics and Gynecology

## 2021-07-30 ENCOUNTER — Telehealth: Payer: Self-pay

## 2021-07-30 ENCOUNTER — Other Ambulatory Visit: Payer: Self-pay

## 2021-07-30 ENCOUNTER — Encounter: Payer: Self-pay | Admitting: Obstetrics and Gynecology

## 2021-07-30 VITALS — BP 129/59 | HR 80 | Wt 259.6 lb

## 2021-07-30 DIAGNOSIS — N950 Postmenopausal bleeding: Secondary | ICD-10-CM | POA: Diagnosis not present

## 2021-07-30 MED ORDER — OZEMPIC (1 MG/DOSE) 4 MG/3ML ~~LOC~~ SOPN: 1.0000 mg | PEN_INJECTOR | SUBCUTANEOUS | 3 refills | Status: DC

## 2021-07-30 NOTE — Telephone Encounter (Signed)
Can her Ozempic dose be increased to 1 mg weekly?

## 2021-07-30 NOTE — Progress Notes (Signed)
Patient ultrasound scheduled for 08/06/21 at 11:15 AM at [facility]. Patient will need to arrive with a full bladder. Patient notified.

## 2021-07-30 NOTE — Addendum Note (Signed)
Addended by: Lauralyn Primes on: 07/30/2021 05:16 PM   Modules accepted: Orders

## 2021-07-30 NOTE — Telephone Encounter (Signed)
Pt request increase to 1 mg weekly dose. Rx sent to preferred pharmacy.

## 2021-07-30 NOTE — Telephone Encounter (Signed)
Patient called she stated her pharmacy is having trouble getting her rx for ozempic she stated the pharmacists told her they can get her ozempic 8 or ozempic 4 if that okay she is requesting authorization can be sent to the pharmacy, patient also stated she seen her OBGYN and he wants to do a procedure on her to check for endometriosis she needs clearance to be sent to her OB call back:[phone]

## 2021-07-30 NOTE — Telephone Encounter (Signed)
Please let me know if she okay to increase the dose of Ozempic to 1 mg weekly.  Please let me know if she has increase Ozempic to 1 mg weekly.

## 2021-07-30 NOTE — Progress Notes (Signed)
  CC: postmenopausal bleeding Subjective:    Patient ID: Courtney [name], female    DOB: 1956/08/11, 65 y.o.   MRN: [id number]  HPI 65 yo G0 seen for discussion of postmenopausal bleeding.   Pt notes menopause at age 36.  She has had postmenopausal bleeding since age 66 or 25.  Pt is not sure.  Pt was evaluated in 2019,with endometrial biopsy which showed benign strips of endometrium but sample was suboptimal.  Hysteroscopy was discussed, but not completed due to the Covid epidemic.  Pt has noted intermittent bleeding for the last year and a half. Bleeding can last a few weeks at a time.     Review of Systems     Objective:   Physical Exam Vitals:   07/30/21 0846  BP: (!) 129/59  Pulse: 80   SSE:  cervix WNL, no obvious stenosis      Assessment & Plan:   1. PMB (postmenopausal bleeding) Will recheck pelvic ultrasound to eval uterus and endometrial stripe. Review of chart showed previous endo bx had small strips of inactive endomtrium.  Hysteroscopy was previously recommended.  Pt given option of repeat endometrial biopsy today or proceed with hysteroscopy D and C.  Pt desires hysteroscopy.  Will schedule procedure.  Pt will need medicine and nephrology clearance due to chronic health issues. - US PELVIC COMPLETE WITH TRANSVAGINAL; Future    Griffin Basil, MD Faculty Attending, Center for [facility]

## 2021-08-06 ENCOUNTER — Other Ambulatory Visit: Payer: Self-pay

## 2021-08-06 ENCOUNTER — Ambulatory Visit (HOSPITAL_COMMUNITY)
Admission: RE | Admit: 2021-08-06 | Discharge: 2021-08-06 | Disposition: A | Discharge status: Home / Self Care | Payer: Medicare HMO | Source: Ambulatory Visit | Attending: Obstetrics and Gynecology | Admitting: Obstetrics and Gynecology

## 2021-08-06 DIAGNOSIS — R9389 Abnormal findings on diagnostic imaging of other specified body structures: Secondary | ICD-10-CM | POA: Diagnosis not present

## 2021-08-06 DIAGNOSIS — N950 Postmenopausal bleeding: Secondary | ICD-10-CM | POA: Diagnosis not present

## 2021-08-06 DIAGNOSIS — D251 Intramural leiomyoma of uterus: Secondary | ICD-10-CM | POA: Diagnosis not present

## 2021-08-09 ENCOUNTER — Telehealth: Payer: Self-pay

## 2021-08-09 NOTE — Telephone Encounter (Signed)
Called patient to discuss potential surgery dates and upcoming appointments w/ her other MDs for surgical clearance. Patient states she will need to check her calendar and requested a call back tomorrow.  Pt has upcoming appointment on 12/15 w/ [facility], will scheduled patient on 12/21 for surgery as a place holder, will move as necessary.

## 2021-08-09 NOTE — Telephone Encounter (Signed)
-----   Message from Griffin Basil, MD sent at 07/30/2021 12:01 PM EST ----- Pt will need hysteroscopy D and C for postmenopausal bleeding.  Long hx of PMB.  She has chronic health issues  and will need  medical and nephrology clearance prior to the case.  Pt also notes her insurance will likely change to [place] on January 1st

## 2021-08-25 ENCOUNTER — Other Ambulatory Visit: Payer: Self-pay

## 2021-08-25 ENCOUNTER — Encounter: Payer: Self-pay | Admitting: Obstetrics and Gynecology

## 2021-08-25 ENCOUNTER — Ambulatory Visit (INDEPENDENT_AMBULATORY_CARE_PROVIDER_SITE_OTHER): Payer: Medicare HMO | Admitting: Obstetrics and Gynecology

## 2021-08-25 VITALS — BP 140/67 | HR 75 | Wt 260.2 lb

## 2021-08-25 DIAGNOSIS — Z01818 Encounter for other preprocedural examination: Secondary | ICD-10-CM | POA: Diagnosis not present

## 2021-08-25 MED ORDER — MISOPROSTOL 200 MCG PO TABS: ORAL_TABLET | ORAL | 0 refills | Status: DC

## 2021-08-25 NOTE — Addendum Note (Signed)
Addended by: Griffin Basil on: 08/25/2021 10:20 AM   Modules accepted: Orders

## 2021-08-25 NOTE — Progress Notes (Signed)
Obstetrics and Gynecology Established Patient Evaluation  Appointment Date: 08/25/2021  OBGYN Clinic: [facility] for [facility]  Primary Care Provider: Cyndi Bender  Referring Provider: Cyndi Bender, PA-C  Chief Complaint:  Chief Complaint  Patient presents with   Pre-op Exam    History of Present Illness: Courtney [name] is a 65 y.o. African-American G0(No LMP recorded. Patient is postmenopausal.), seen for the above chief complaint.   Patient is scheduled for a hysteroscopy, dilation and curettage with Dr. Elgie Congo on 09/01/21 for persistent post menopausal bleeding and she was inadvertently placed on my scheduled. Per Dr. Elgie Congo, appointment today is to make sure everything is done pre-op and for cytotec prescription.   Patient is doing well and w/o issue  Review of Systems: Pertinent items are noted in HPI.   Patient Active Problem List   Diagnosis Date Noted   Anemia in chronic kidney disease 10/12/2020   Chronic kidney disease, stage IV (severe) ([place]) 10/12/2020   Secondary hyperparathyroidism of renal origin ([place]) 10/12/2020   Severe obstructive sleep apnea 08/25/2020   Benign hypertensive kidney disease with chronic kidney disease 07/01/2020   Proteinuria 04/27/2020   Adult BMI 30+ 04/27/2020   Abscess of neck 03/09/2020   Abscess of groin 03/09/2020   Abscess of thigh 03/09/2020   Abscess of vulva 03/09/2020   Absolute anemia 03/09/2020   Benign essential HTN 03/09/2020   Cellulitis and abscess of face 03/09/2020   Chronic kidney disease, stage 3b ([place]) 03/09/2020   Combined fat and carbohydrate induced hyperlipemia 03/09/2020   Hand discomfort 03/09/2020   Renal mass 03/09/2020   Leg ulcer ([place]) 03/09/2020   Encounter for screening colonoscopy 03/09/2020   Endometrial cells on cervical Pap smear inconsistent w/LMP 06/28/2018   PMB (postmenopausal bleeding) 06/28/2018   Type 2 diabetes mellitus with diabetic chronic kidney disease ([place])  06/25/2018   Hypothyroidism, postop 12/26/2014   Diabetes mellitus type 2, uncontrolled 08/28/2014   Thyroid cancer ([place]) 08/28/2014     Past Medical History:  Past Medical History:  Diagnosis Date   Anemia    Arthritis    Chronic kidney disease    Diabetes mellitus without complication ([place])    Hyperlipidemia    Hypertension    MRSA infection 2013   on leg; surgery    Neuropathy due to secondary diabetes ([place])    Thyroid disease     Past Surgical History:  Past Surgical History:  Procedure Laterality Date   BILATERAL CARPAL TUNNEL RELEASE Bilateral 2003   CATARACT EXTRACTION W/ INTRAOCULAR LENS IMPLANT Left 2016   CHOLECYSTECTOMY     COLONOSCOPY WITH PROPOFOL N/A 05/14/2020   Procedure: COLONOSCOPY WITH PROPOFOL;  Surgeon: Lin Landsman, MD;  [facility]: 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   EYE SURGERY     KNEE ARTHROSCOPY Left 05/05/2016   Procedure: ARTHROSCOPY KNEE;  Surgeon: Thornton Park, MD;  Location: [facility] ORS;  Service: Orthopedics;  Laterality: Left;   SHOULDER ARTHROSCOPY Left 2004   THYROID SURGERY  2004   TRIGGER FINGER RELEASE  2003    Past Obstetrical History:  OB History  Gravida Para Term Preterm AB Living  0 0 0 0 0 0  SAB IAB Ectopic Multiple Live Births  0 0 0 0 0   Social History:  Social History   Socioeconomic History   Marital status: Single    Spouse name: n/a   Number of children: 0   Years of education:  12th grade    Highest education level: High school graduate  Occupational History   Occupation: retired  Tobacco Use   Smoking status: Former    Types: Cigarettes   Smokeless tobacco: Never  Vaping Use   Vaping Use: Never used  Substance and Sexual Activity   Alcohol use: Yes    Alcohol/week: 0.0 standard drinks    Comment: occassional   Drug use: No   Sexual activity: Not on file  Other Topics Concern   Not on file  Social History Narrative   Single   0  children   Merchandise sales (travels regularly)      Beer and wine on occasion   First menstrual cycle: 6th grade   Postmenopausal   Social Determinants of Radio broadcast assistant Strain: Low Risk    Difficulty of Paying Living Expenses: Not very hard  Food Insecurity: No Food Insecurity   Worried About Charity fundraiser in the Last Year: Never true   Ran Out of Food in the Last Year: Never true  Transportation Needs: No Transportation Needs   Lack of Transportation (Medical): No   Lack of Transportation (Non-Medical): No  Physical Activity: Not on file  Stress: No Stress Concern Present   Feeling of Stress : Only a little  Social Connections: Moderately Integrated   Frequency of Communication with Friends and Family: More than three times a week   Frequency of Social Gatherings with Friends and Family: More than three times a week   Attends Religious Services: More than 4 times per year   Active Member of [employer] or Organizations: Yes   Attends Music therapist: More than 4 times per year   Marital Status: Never married  Human resources officer Violence: Not At Risk   Fear of Current or Ex-Partner: No   Emotionally Abused: No   Physically Abused: No   Sexually Abused: No    Family History:  Family History  Problem Relation Age of Onset   Hypertension Mother    Thyroid disease Mother    CVA Mother    Hypertension Sister    Thyroid disease Sister    Hypertension Brother    Hyperlipidemia Brother      Medications [name] had no medications administered during this visit. Current Outpatient Medications  Medication Sig Dispense Refill   Acetaminophen 325 MG CAPS Take by mouth.     allopurinol (ZYLOPRIM) 100 MG tablet Take 100 mg by mouth daily.     amLODipine (NORVASC) 10 MG tablet Take 10 mg by mouth daily. Reported on 11/06/2015     empagliflozin (JARDIANCE) 10 MG TABS tablet Take by mouth daily.     insulin regular (NOVOLIN R RELION) 100  units/mL injection INJECT 30 UNITS SUB-Q before b'fas. ReliOn insulin. 20 mL 11   Insulin Syringe-Needle U-100 31G X 15/64" 0.5 ML MISC Use 3x a day 100 each 11   levothyroxine (SYNTHROID) 137 MCG tablet Take 1 tablet by mouth daily.     lovastatin (MEVACOR) 20 MG tablet Take 20 mg by mouth at bedtime.      Semaglutide, 1 MG/DOSE, (OZEMPIC, 1 MG/DOSE,) 4 MG/3ML SOPN Inject 1 mg into the skin once a week. 3 mL 3   torsemide (DEMADEX) 20 MG tablet Take 20 mg by mouth daily.     TRESIBA FLEXTOUCH 200 UNIT/ML FlexTouch Pen INJECT 70 UNITS SUBCUTANEOUSLY ONCE DAILY 9 mL 3   amoxicillin-clavulanate (AUGMENTIN) 875-125 MG tablet Take 1 tablet by mouth 2 (  two) times daily. (Patient not taking: Reported on 07/30/2021)     Cholecalciferol 125 MCG (5000 UT) capsule Take by mouth. (Patient not taking: Reported on 07/30/2021)     dapagliflozin propanediol (FARXIGA) 5 MG TABS tablet Take by mouth. (Patient not taking: Reported on 07/30/2021)     diphenhydrAMINE (BENADRYL) 50 MG tablet Take by mouth. (Patient not taking: Reported on 07/30/2021)     FARXIGA 5 MG TABS tablet Take 5 mg by mouth daily. (Patient not taking: Reported on 07/30/2021)     insulin glargine, 2 Unit Dial, (TOUJEO MAX SOLOSTAR) 300 UNIT/ML Solostar Pen Inject into the skin. (Patient not taking: Reported on 07/30/2021)     levothyroxine (EUTHYROX) 137 MCG tablet Take 1 tablet (137 mcg total) by mouth daily before breakfast. (Patient not taking: Reported on 07/30/2021) 90 tablet 3   ON/GO COVID-19 ANTIGEN TEST KIT See admin instructions. (Patient not taking: Reported on 07/30/2021)     polyethylene glycol-electrolytes (NULYTELY) 420 g solution Take by mouth once. (Patient not taking: Reported on 07/30/2021)     No current facility-administered medications for this visit.    Allergies Lisinopril, Meloxicam, and Prednisone   Physical Exam:  BP 140/67    Pulse 75    Wt 260 lb 3.2 oz (118 kg)    BMI 40.75 kg/m  Body mass index is 40.75  kg/m. General appearance: Well nourished, well developed female in no acute distress.  Respiratory:  Normal respiratory effort Neuro/Psych:  Normal mood and affect.  Skin:  Warm and dry.   Laboratory: no new labs  Radiology: no new imaging  Assessment: pt stable  Plan:  1. Pre-op evaluation Surgery and peri-operative period d/w her. Patient has appointment with her endocrinologist tomorrow and it looks like plan is to get labwork. I will message her Endocrinologist to see if she can get additional blood work, too, and to go over her insulin regimen to take the night before and the day of surgery since she'll be NPO after midnight.   Her next appointment with her nephrologist is early next year and last visit a few months ago showed labs were stable; f/u labs tomorrow (renal function panel and PTH, intact)  I don't see pre op appt scheduled for the patient so I will touch base with our scheduler to see if there is one in the system.   Vaginal cytotec to place the night before d/w her and she is amenable to this.   RTC 36mfor post op with Dr. BRenne MuscaMD Attending [facility]

## 2021-08-26 ENCOUNTER — Ambulatory Visit: Payer: Medicare HMO | Admitting: Internal Medicine

## 2021-08-26 VITALS — BP 140/72 | HR 80 | Ht 67.0 in | Wt 259.0 lb

## 2021-08-26 DIAGNOSIS — E7849 Other hyperlipidemia: Secondary | ICD-10-CM | POA: Diagnosis not present

## 2021-08-26 DIAGNOSIS — E890 Postprocedural hypothyroidism: Secondary | ICD-10-CM

## 2021-08-26 DIAGNOSIS — C73 Malignant neoplasm of thyroid gland: Secondary | ICD-10-CM

## 2021-08-26 DIAGNOSIS — E1142 Type 2 diabetes mellitus with diabetic polyneuropathy: Secondary | ICD-10-CM

## 2021-08-26 DIAGNOSIS — E1165 Type 2 diabetes mellitus with hyperglycemia: Secondary | ICD-10-CM

## 2021-08-26 LAB — BASIC METABOLIC PANEL
BUN: 20 mg/dL (ref 6–23)
CO2: 32 mEq/L (ref 19–32)
Calcium: 8.7 mg/dL (ref 8.4–10.5)
Chloride: 104 mEq/L (ref 96–112)
Creatinine, Ser: 1.71 mg/dL — ABNORMAL HIGH (ref 0.40–1.20)
GFR: 31.13 mL/min — ABNORMAL LOW (ref >60.00)
Glucose, Bld: 100 mg/dL — ABNORMAL HIGH (ref 70–99)
Potassium: 3.7 mEq/L (ref 3.5–5.1)
Sodium: 146 mEq/L — ABNORMAL HIGH (ref 135–145)

## 2021-08-26 LAB — LIPID PANEL
Cholesterol: 143 mg/dL (ref 0–200)
HDL: 77.6 mg/dL (ref >39.00)
LDL Cholesterol: 51 mg/dL (ref 0–99)
NonHDL: 65
Total CHOL/HDL Ratio: 2
Triglycerides: 70 mg/dL (ref 0.0–149.0)
VLDL: 14 mg/dL (ref 0.0–40.0)

## 2021-08-26 LAB — T4, FREE: Free T4: 1.3 ng/dL (ref 0.60–1.60)

## 2021-08-26 LAB — TSH: TSH: 0.2 u[IU]/mL — ABNORMAL LOW (ref 0.35–5.50)

## 2021-08-26 NOTE — Patient Instructions (Addendum)
Please continue: - Jardiance 10 mg before b'fast - Tresiba 70 units in am - Regular insulin 30 units before breakfast and 25 units before  larger dinner  Please increase: - Ozempic 1 mg weekly in a.m.   Please hold Jardiance from Saturday until after the surgery. The day before surgery take the regimen as prescribed. The day of the surgery, take only 50 units of Tresiba and only take regular insulin for correction if needed (if sugars higher than 180).  Restart taking mealtime regular insulin when you resume eating regular meals.  Please continue levothyroxine 137 mcg daily.  Take the thyroid hormone every day, with water, at least 30 minutes before breakfast, separated by at least 4 hours from: - acid reflux medications - calcium - iron - multivitamins  Please stop at the lab.  Please return in 4 months with your sugar log.

## 2021-08-26 NOTE — Progress Notes (Signed)
Patient ID: Courtney [name], female   DOB: 29-May-1956, 65 y.o.   MRN: [id number]  This visit occurred during the SARS-CoV-2 public health emergency.  Safety protocols were in place, including screening questions prior to the visit, additional usage of staff PPE, and extensive cleaning of exam room while observing appropriate contact time as indicated for disinfecting solutions.   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 Last visit 4 months ago.  Interim history: She has increased urination after she started [place]; no blurry vision, nausea, chest pain.  She has a h/o irregular menses. She developed postmenopausal bleeding in 2019 >> will have surgery 09/01/2021. She needs clearance. She had fatigue during the summer >> improved.   DM2: Reviewed HbA1c levels: Lab Results  Component Value Date   HGBA1C 7.0 (A) 04/26/2021   HGBA1C 6.9 (A) 12/23/2020   HGBA1C 6.8 (A) 08/19/2020   HGBA1C 7.8 (A) 05/19/2020   HGBA1C 10.7 (A) 02/13/2020   HGBA1C 12.6 (A) 11/13/2019   HGBA1C 9.8 (A) 05/13/2019   HGBA1C 13.7 (A) 10/23/2018   HGBA1C 10.1 (A) 06/25/2018   HGBA1C 8.6 (A) 02/14/2018   HGBA1C 8.3 11/13/2017   HGBA1C 9.1 08/16/2017   HGBA1C 11.3 05/31/2017   HGBA1C 11.9 02/24/2017   HGBA1C 9.5 11/25/2016   HGBA1C 8.2 (H) 04/13/2016   HGBA1C 9.1 02/05/2016   HGBA1C 9.8 (H) 09/18/2015   HGBA1C 9.9 06/19/2015   HGBA1C 11.0 03/27/2015   HGBA1C 10.9 (H) 12/26/2014  08/22/2016: HbA1c 11.8% 08/01/2014: HbA1c 14.5% 05/02/2014: HbA1c 14.8% 01/31/2014: HbA1c 13.6% 11/01/2013: HbA1c 15.1%  She is on: - Tresiba >> Toujeo >> Tresiba 70 units in a.m. - Novolin Regular insulin 30 units before breakfast  30 >> 25 units before dinner  - Ozempic 0.5 mg weekly in a.m.  - Farxiga 5 >> Jardiance 10 mg  daily - started by Dr. Juleen China 01/2021 Stopped Metformin 500 mg 2x a day - b/c leg swelling. She was on Novolin 70/30 48 units 2x a day 15 min before the meals  She was on Amaryl 8 mg in am She was on Metformin >> stopped 2/2 CKD.  She checks sugars twice a day: - am:  78, 123-140, 179, 183 >> 93-159, 169 >>  94-135, 169 (forgot meds the night before) - 2h after b'fast: n/c - before lunch: 114, 192, 229 >> n/c >> 135 >> 120-140 >> 101, 106 >> n/c - 2h after lunch: n/c >> 67, 228 >> n/c - before dinner: 63, 75-124, 150 >> 120-130 >> 75-118 >> 65, 95-134, 165 - 2h after dinner: n/c >> 131, 269 >> n/c - bedtime:  240s >> n/c >> 287 >> n/c > 108, 168 >> n/c >> 131 >> n/c - nighttime: n/c  Lowest sugar was 63 >> 78 >> 75 >> 65; it is unclear at which level she has hypoglycemia Highest sugar was 325 >> .Marland Kitchen.169 >> 169.  Glucometer: Prodigy >> ReliOn  Pt's meals are: - Breakfast: chicken  - Lunch: peanuts, sandwich + crystal lite drink - Dinner: frozen dinner - Snacks: fruit, small bag potato chips, peanuts She was drinking sweet tea and sodas in the past and I strongly advised her to stop.  -+ Stage III CKD; BUN/creatinine:  04/20/2021: 18/1.65 04/27/2020: CMP normal with the exception of glucose 136, BUN/creatinine 22/2.18, GFR 27, calcium 8.6 (8.7-10.3), alkaline phosphatase  145 (48-121), ALT 33 (0-32), Vitamin D 23.5. In PCPs note, it was mentioned that she was lost for follow-up with nephrology and she was referred back to [facility]. 10/28/2019: Glucose 96, BUN/creatinine 12/1.47, GFR 43 Lab Results  Component Value Date   BUN 21 06/25/2018   Lab Results  Component Value Date   CREATININE 1.44 (H) 06/25/2018  08/22/2016: BUN/creatinine 20/1.41 03/18/2016: BUN/creatinine 16/1.21, EGFR 57, Calcium 8.7, AST/ALT 17/24 09/18/2015: BUN/creatinine 14/1.2, GFR 57 (previously 55) 08/01/2014: 15/1.26, GFR 55 On Cozaar.  -+ HL; lipids: 04/27/2020:  130/121/69/40 10/28/2019: 166/88/91/60 Lab Results  Component Value Date   CHOL 134 06/25/2018   HDL 70.20 06/25/2018   LDLCALC 54 06/25/2018   TRIG 47.0 06/25/2018   CHOLHDL 2 06/25/2018  03/18/2016: Lipids: 156/85/70/69 09/18/2015: 160/57/87/62 08/01/2014: 145/78/74/55 On lovastatin 20.  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  She had left eye cataract surgery.  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  - She did not have RAI treatment at that time, however, she was found to have a thyroid mass in 2005 >> This was biopsied and returned as normal thyroid tissue, but had RAI treatment at that time.  - The posttreatment whole-body scan was negative for any cancer spread.   Thyroglobulin returned at 2.3 in 08/2014 >> I ordered a neck ultrasound that showed a stable nodule with a fatty hilum, consistent with a benign lymph node, but no other masses in the surgical bed.   The thyroglobulin level remained detectable, and was actually 9.9 in 10/2015.   A neck ultrasound (11/2015) showed a stable 1.4 nodule in the thyroid bed, consistent with a benign process   Her thyroglobulin levels continue to be detectable  A neck ultrasound (02/2020) was negative for metastasis or recurrences  A whole-body scan (03/2020) was also negative for abnormal uptake  Reviewed her thyroglobulin and ATA antibodies: Lab Results  Component Value Date   THYROGLB 2.3 (L) 04/26/2021   THYROGLB 2.0 (L) 08/19/2020   THYROGLB 3.3 02/13/2020   THYROGLB 2.1 (L) 05/13/2019   THYROGLB 2.5 (L) 06/25/2018   THYROGLB 1.8 (L) 08/15/2017   THYROGLB 2.5 (L) 08/29/2016   THYROGLB 9.9  11/06/2015   THYROGLB 2.4 (L) 09/18/2015   THYROGLB 0.8 (L) 12/26/2014   THGAB <1 04/26/2021   THGAB <1 08/19/2020   THGAB <1 02/13/2020   THGAB <1 05/13/2019   THGAB <1 06/25/2018   THGAB <1 08/15/2017   THGAB <1 08/29/2016   THGAB <1 09/18/2015   THGAB <1.0 12/26/2014   THGAB <1 08/22/2014   THGAB <1.0 08/22/2014   Postsurgical hypothyroidism:  Reviewed her TFTs: Lab Results  Component Value Date   TSH 0.27 (L) 04/26/2021   TSH 1.50 10/07/2020   TSH 0.10 (L) 08/19/2020   TSH 0.91 02/13/2020   TSH 0.89 05/13/2019   FREET4 1.39 04/26/2021   FREET4 1.18 10/07/2020   FREET4 1.47 08/19/2020   FREET4 1.37 02/13/2020   FREET4 1.24 05/13/2019  10/28/2019: TSH 1.6 08/01/2014: TSH 1.89  Pt is on levothyroxine 137 mcg daily (decreased from 150 mcg 08/2020), taken: - in am - fasting - at least 30 min from b'fast -  no calcium - no iron - no multivitamins - no PPIs - not on Biotin On vitamin D - 50,000 units once a week.  No FH of thyroid cancer. No h/o radiation tx to head or neck other than RAI treatment.  No herbal supplements. No Biotin use. No recent steroids use.   Pt denies: - feeling nodules in neck - hoarseness - dysphagia - choking - SOB with lying down  Other labs reviewed prior records from PCP: 10/28/2019: Vitamin D 39.2  ROS: + see HPI Neurological: no tremors/+ numbness/+ tingling/no dizziness  I reviewed pt's medications, allergies, PMH, social hx, family hx, and changes were documented in the history of present illness. Otherwise, unchanged from my initial visit note.  Past Medical History:  Diagnosis Date   Anemia    Arthritis    Chronic kidney disease    Diabetes mellitus without complication ([place])    Hyperlipidemia    Hypertension    MRSA infection 2013   on leg; surgery    Neuropathy due to secondary diabetes [facility])    Thyroid disease    Past Surgical History:  Procedure Laterality Date   BILATERAL CARPAL TUNNEL RELEASE Bilateral  2003   CATARACT EXTRACTION W/ INTRAOCULAR LENS IMPLANT Left 2016   CHOLECYSTECTOMY     COLONOSCOPY WITH PROPOFOL N/A 05/14/2020   Procedure: COLONOSCOPY WITH PROPOFOL;  Surgeon: Lin Landsman, MD;  Location: [facility] ENDOSCOPY;  Service: Gastroenterology;  Laterality: N/A;   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   EYE SURGERY     KNEE ARTHROSCOPY Left 05/05/2016   Procedure: ARTHROSCOPY KNEE;  Surgeon: Thornton Park, MD;  Location: [facility] ORS;  Service: Orthopedics;  Laterality: Left;   SHOULDER ARTHROSCOPY Left 2004   THYROID SURGERY  2004   TRIGGER FINGER RELEASE  2003   History   Social History Main Topics   Smoking status: Former Smoker   Smokeless tobacco: Not on file   Alcohol Use: No   Drug Use: No   Social History Narrative   Single   0 children   [place] (travels regularly)      Beer and wine on occasion   First menstrual cycle: 6th grade   Postmenopausal      Current Outpatient Medications  Medication Sig Dispense Refill   Acetaminophen 325 MG CAPS Take by mouth.     allopurinol (ZYLOPRIM) 100 MG tablet Take 100 mg by mouth daily.     amLODipine (NORVASC) 10 MG tablet Take 10 mg by mouth daily. Reported on 11/06/2015     amoxicillin-clavulanate (AUGMENTIN) 875-125 MG tablet Take 1 tablet by mouth 2 (two) times daily. (Patient not taking: Reported on 07/30/2021)     Cholecalciferol 125 MCG (5000 UT) capsule Take by mouth. (Patient not taking: Reported on 07/30/2021)     dapagliflozin propanediol (FARXIGA) 5 MG TABS tablet Take by mouth. (Patient not taking: Reported on 07/30/2021)     diphenhydrAMINE (BENADRYL) 50 MG tablet Take by mouth. (Patient not taking: Reported on 07/30/2021)     empagliflozin (JARDIANCE) 10 MG TABS tablet Take by mouth daily.     FARXIGA 5 MG TABS tablet Take 5 mg by mouth daily. (Patient not taking: Reported on 07/30/2021)     insulin glargine, 2 Unit Dial, (TOUJEO MAX SOLOSTAR) 300 UNIT/ML Solostar Pen Inject into the skin. (Patient not  taking: Reported on 07/30/2021)     insulin regular (NOVOLIN R RELION) 100 units/mL injection INJECT 30 UNITS SUB-Q before b'fas. ReliOn insulin. 20 mL 11  Insulin Syringe-Needle U-100 31G X 15/64" 0.5 ML MISC Use 3x a day 100 each 11   levothyroxine (EUTHYROX) 137 MCG tablet Take 1 tablet (137 mcg total) by mouth daily before breakfast. (Patient not taking: Reported on 07/30/2021) 90 tablet 3   levothyroxine (SYNTHROID) 137 MCG tablet Take 1 tablet by mouth daily.     lovastatin (MEVACOR) 20 MG tablet Take 20 mg by mouth at bedtime.      misoprostol (CYTOTEC) 200 MCG tablet Place one tab as far in the vagina as possible the night before your surgery 1 tablet 0   ON/GO COVID-19 ANTIGEN TEST KIT See admin instructions. (Patient not taking: Reported on 07/30/2021)     polyethylene glycol-electrolytes (NULYTELY) 420 g solution Take by mouth once. (Patient not taking: Reported on 07/30/2021)     Semaglutide, 1 MG/DOSE, (OZEMPIC, 1 MG/DOSE,) 4 MG/3ML SOPN Inject 1 mg into the skin once a week. 3 mL 3   torsemide (DEMADEX) 20 MG tablet Take 20 mg by mouth daily.     TRESIBA FLEXTOUCH 200 UNIT/ML FlexTouch Pen INJECT 70 UNITS SUBCUTANEOUSLY ONCE DAILY 9 mL 3   No current facility-administered medications for this visit.   NKDA   Family History  Problem Relation Age of Onset   Hypertension Mother    Thyroid disease Mother    CVA Mother    Hypertension Sister    Thyroid disease Sister    Hypertension Brother    Hyperlipidemia Brother    PE: BP 140/72 (BP Location: Right Arm, Patient Position: Sitting, Cuff Size: Normal)    Pulse 80    Ht '5\' 7"'  (1.702 m)    Wt 259 lb (117.5 kg)    SpO2 98%    BMI 40.57 kg/m  Body mass index is 40.57 kg/m. Wt Readings from Last 3 Encounters:  08/26/21 259 lb (117.5 kg)  08/25/21 260 lb 3.2 oz (118 kg)  07/30/21 259 lb 9.6 oz (117.8 kg)   Constitutional: overweight, in NAD Eyes: PERRLA, EOMI, no exophthalmos ENT: moist mucous membranes, no neck masses  palpated, thyroidectomy scar healed, no cervical lymphadenopathy Cardiovascular: RRR, No MRG Respiratory: CTA B Gastrointestinal: abdomen soft, NT, ND, BS+ Musculoskeletal: no deformities, strength intact in all 4 Skin: moist, warm, no rashes Neurological: no tremor with outstretched hands, DTR normal in all 4  ASSESSMENT: 1. DM2, insulin-dependent, uncontrolled, with complications - CKD - DR  She does not have a family history of medullary thyroid cancer a personal history of pancreatitis.  2. 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 She did not have RAI treatment at that time, however, she was found to have a thyroid mass in 2005. This was biopsied and returned as normal thyroid tissue.She had RAI treatment at that time. The posttreatment whole-body scan was negative for any cancers.  - Neck U/S (09/19/2014): 1. Post total thyroidectomy. 2. No change in the previously biopsied approximately 1.4 cm echogenic solid nodule within the inferior aspect of the right lobe of the thyroid, grossly unchanged since the 2005 examination. 3. Apparent development of an approximately 0.4 cm hypoechoic nodule within the right thyroidectomy bed - while too small for definitive characterization, this nodule appears to contain an echogenic hilum and thus is favored to represent a non pathologically enlarged cervical lymph node.  - Neck U/S (11/27/2015):  1. Surgical changes of prior total thyroidectomy.  2. Unchanged 1.4 cm echogenic soft tissue nodule in the right thyroid resection bed. Continued stability over time consistent with a benign process.  - Neck U/S (03/02/2020): Stable surgical changes following total thyroidectomy. No residual thyroid bed abnormality.  - WBS (03/27/2020): her copay: 6800$!!  No evidence of local  thyroid cancer recurrence or distant metastasis by I 131 imaging.   3. Post surgical hypothyroidism  PLAN:  1. Patient with longstanding, uncontrolled, type 2 diabetes, basal-bolus insulin regimen and also SGLT2 inhibitor and GLP-1 receptor agonist.  We were able to switch from [place] to [place] after he changed her insurance to [employer].  HbA1c levels have been increasing for the last few visits, and I recently recommended to increase Ozempic.  However, he was not able to obtain the 1 mg dose until recently.  She did not start this yet.  She tolerates it well.  She also continues on an SGLT2 inhibitor.  Dr. Juleen China added to [place], but she is now on Jardiance per insurance preference. -At this visit, sugars are mostly at goal, but HbA1c is higher (see below).  Upon questioning, she relaxed her diet now around the holidays.  We discussed about increasing Ozempic to 1 mg weekly but otherwise continue the current regimen. -She has uterine surgery coming up in 6 days.  At this visit, I gave her perioperative recommendations about her regimen.  We will go ahead and hold the for 5 days before the surgery and then resume it immediately after.  The day before the surgery she can continue with the same regimen but in the day of the surgery, I advised her to only take 50 units of Tresiba and only take regular insulin for correction in the morning if needed, if sugars are higher than 180.  She can then resume taking mealtime insulin when she starts eating normally after the surgery. - I advised her to: Patient Instructions  Please continue: - Jardiance 10 mg before b'fast - Tresiba 70 units in am - Regular insulin 30 units before breakfast and 25 units before  larger dinner  Please increase: - Ozempic 1 mg weekly in a.m.   Please hold Jardiance from Saturday until after the surgery. The day before surgery take the regimen as prescribed. The day of the surgery, take only 50 units of Tresiba and  only take regular insulin for correction if needed (if sugars higher than 180).  Restart taking mealtime regular insulin when you resume eating regular meals.  Please continue levothyroxine 137 mcg daily.  Take the thyroid hormone every day, with water, at least 30 minutes before breakfast, separated by at least 4 hours from: - acid reflux medications - calcium - iron - multivitamins  Please stop at the lab.  Please return in 4 months with your sugar log.   - we checked her HbA1c: 7.8% (higher) - advised to check sugars at different times of the day - 4x a day, rotating check times - advised for yearly eye exams >> she is UTD - at today's visit, we will check a BMP and also an intact PTH, as recommended by her OB/GYN doctor. - return to clinic in 4 months       2. 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  Repeat neck ultrasound in 02/2020 showed stable surgical changes with no residual thyroid abnormalities.  A whole-body scan in 03/2020 showed no evidence of local recurrence or metastasis.  This was a very expensive test for her and she had to pay approximately $7000 for it.  -She has no neck compression symptoms -At last visit, her thyroglobulin level was detectable, but not higher.  We did discuss that she may need a PET scan in the near future if thyroglobulin increases.  We will recheck it at next visit.  3. Postsurgical hypothyroidism - latest thyroid labs reviewed with pt. >> TSH was slightly low: Lab Results  Component Value Date   TSH 0.27 (L) 04/26/2021  - she continues on LT4 137 mcg daily -we did not change the dose due to the positive thyroglobulin and also previous normal  TFTs on the above dose. - pt feels good on this dose. - we discussed about taking the thyroid hormone every day, with water, >30 minutes before breakfast, separated by >4 hours from acid reflux medications, calcium, iron, multivitamins. Pt. is taking it correctly. - will check thyroid tests today: TSH and fT4 - If labs are abnormal, she will need to return for repeat TFTs in 1.5 months  Component     Latest Ref Rng & Units 08/26/2021  Sodium     135 - 145 mEq/L 146 (H)  Potassium     3.5 - 5.1 mEq/L 3.7  Chloride     96 - 112 mEq/L 104  CO2     19 - 32 mEq/L 32  Glucose     70 - 99 mg/dL 100 (H)  BUN     6 - 23 mg/dL 20  Creatinine     0.40 - 1.20 mg/dL 1.71 (H)  GFR     >60.00 mL/min 31.13 (L)  Calcium     8.4 - 10.5 mg/dL 8.7  Cholesterol     0 - 200 mg/dL 143  Triglycerides     0.0 - 149.0 mg/dL 70.0  HDL Cholesterol     >39.00 mg/dL 77.60  VLDL     0.0 - 40.0 mg/dL 14.0  LDL (calc)     0 - 99 mg/dL 51  Total CHOL/HDL Ratio      2  NonHDL      65.00  T4,Free(Direct)     0.60 - 1.60 ng/dL 1.30  TSH     0.35 - 5.50 uIU/mL 0.20 (L)  PTH, Intact     15 - 65 pg/mL 45   GFR is lower, Ca and PTH normal.  Sodium is slightly high TSH is also slightly lower, but it is at goal in the setting of thyroid cancer with positive thyroglobulin.  At next visit, plan to repeat the child and if the TSH is lower, we may need to reduce the dose at that time.  I will clear her for surgery from the thyroid and diabetes point of view for now.  [name], MD PhD [facility] Endocrinology

## 2021-08-27 ENCOUNTER — Encounter (HOSPITAL_BASED_OUTPATIENT_CLINIC_OR_DEPARTMENT_OTHER): Payer: Self-pay | Admitting: Obstetrics and Gynecology

## 2021-08-27 ENCOUNTER — Encounter: Payer: Self-pay | Admitting: Internal Medicine

## 2021-08-27 ENCOUNTER — Other Ambulatory Visit: Payer: Self-pay

## 2021-08-27 LAB — PARATHYROID HORMONE, INTACT (NO CA): PTH: 45 pg/mL (ref 15–65)

## 2021-08-27 NOTE — Progress Notes (Signed)
Spoke w/ via phone for pre-op interview--- pt Lab needs dos----  cbc, t&s, ekg             Lab results------ pt has bmp result in epic done 08-26-2021 COVID test -----patient states asymptomatic no test needed Arrive at ------- 0945 on 09-01-2021 NPO after MN NO Solid Food.  Clear liquids from MN until--- 0845 Med rec completed Medications to take morning of surgery ----- allopurinol, norvasc, euthyrox, lovastatin Diabetic medication ----- pt verbalized she was given instructions (and will follow) by her endocrinologist for her insulin's and jardiance  Patient instructed no nail polish to be worn day of surgery Patient instructed to bring photo id and insurance card day of surgery Patient aware to have Driver (ride ) / caregiver   for 24 hours after surgery -- friend, francis holt Patient Special Instructions ----- n/a Pre-Op special Istructions ----- pt has clearance note from dr Cruzita Lederer dated 08-26-2021 in epic / chart Patient verbalized understanding of instructions that were given at this phone interview. Patient denies shortness of breath, chest pain, fever, cough at this phone interview.    Anesthesia Review:  HTN; IDDM2; CKD 3; hx thyroid cancer s/p total thyroidectomy 2004, no radiation/ chemo  PCP: Cyndi Bender PA Endocrinologist:  Dr Cruzita Lederer (lov [phone] epic) Nephrologist:  Dr Juleen China (lov 04-20-2021 epic)  EKG : 04-27-2016 epic Echo : no Stress test: no Activity level: denies sob w/any activity Sleep Study/ CPAP : no Fasting Blood Sugar :      / Checks Blood Sugar -- times a day:  pt stated does not check , except in awhile Blood Thinner/ Instructions /Last Dose: no ASA / Instructions/ Last Dose :  no

## 2021-08-31 NOTE — Anesthesia Preprocedure Evaluation (Addendum)
Anesthesia Evaluation  Patient identified by MRN, date of birth, ID band Patient awake    Reviewed: Allergy & Precautions, NPO status , Patient's Chart, lab work & pertinent test results  Airway Mallampati: II  TM Distance: >3 FB Neck ROM: Full    Dental no notable dental hx. (+) Teeth Intact, Dental Advisory Given   Pulmonary sleep apnea , former smoker,    Pulmonary exam normal breath sounds clear to auscultation       Cardiovascular hypertension, Pt. on medications Normal cardiovascular exam Rhythm:Regular Rate:Normal     Neuro/Psych  Neuromuscular disease negative psych ROS   GI/Hepatic   Endo/Other  diabetesHypothyroidism Morbid obesity (BMI 40.12)  Renal/GU Renal InsufficiencyRenal diseaseLab Results      Component                Value               Date                      CREATININE               1.71 (H)            08/26/2021                BUN                      20                  08/26/2021                NA                       146 (H)             08/26/2021                K                        3.7                 08/26/2021                CL                       104                 08/26/2021                CO2                      32                  08/26/2021                Musculoskeletal  (+) Arthritis ,   Abdominal (+) + obese,   Peds  Hematology  (+) anemia , Lab Results      Component                Value               Date                      WBC  6.3                 09/01/2021                HGB                      12.4                09/01/2021                HCT                      39.6                09/01/2021                MCV                      85.2                09/01/2021                PLT                      271                 09/01/2021              Anesthesia Other Findings All: Lisinopril, Meloxicam, Prednisone   Reproductive/Obstetrics                           Anesthesia Physical Anesthesia Plan  ASA: 3  Anesthesia Plan: General   Post-op Pain Management: Toradol IV (intra-op), Dilaudid IV and Tylenol PO (pre-op)   Induction: Intravenous  PONV Risk Score and Plan: 4 or greater and Treatment may vary due to age or medical condition, Midazolam and Ondansetron  Airway Management Planned: LMA  Additional Equipment: None  Intra-op Plan:   Post-operative Plan:   Informed Consent: I have reviewed the patients History and Physical, chart, labs and discussed the procedure including the risks, benefits and alternatives for the proposed anesthesia with the patient or authorized representative who has indicated his/her understanding and acceptance.     Dental advisory given  Plan Discussed with: CRNA  Anesthesia Plan Comments: (GA)       Anesthesia Quick Evaluation

## 2021-09-01 ENCOUNTER — Other Ambulatory Visit: Payer: Self-pay

## 2021-09-01 ENCOUNTER — Ambulatory Visit (HOSPITAL_BASED_OUTPATIENT_CLINIC_OR_DEPARTMENT_OTHER): Payer: Medicare HMO | Admitting: Anesthesiology

## 2021-09-01 ENCOUNTER — Ambulatory Visit (HOSPITAL_BASED_OUTPATIENT_CLINIC_OR_DEPARTMENT_OTHER)
Admission: RE | Admit: 2021-09-01 | Discharge: 2021-09-01 | Disposition: A | Discharge status: Home / Self Care | Payer: Medicare HMO | Attending: Obstetrics and Gynecology | Admitting: Obstetrics and Gynecology

## 2021-09-01 ENCOUNTER — Encounter (HOSPITAL_BASED_OUTPATIENT_CLINIC_OR_DEPARTMENT_OTHER): Payer: Self-pay | Admitting: Obstetrics and Gynecology

## 2021-09-01 ENCOUNTER — Encounter (HOSPITAL_BASED_OUTPATIENT_CLINIC_OR_DEPARTMENT_OTHER)
Admission: RE | Disposition: A | Discharge status: Home / Self Care | Payer: Self-pay | Source: Home / Self Care | Attending: Obstetrics and Gynecology

## 2021-09-01 DIAGNOSIS — R9389 Abnormal findings on diagnostic imaging of other specified body structures: Secondary | ICD-10-CM | POA: Insufficient documentation

## 2021-09-01 DIAGNOSIS — N289 Disorder of kidney and ureter, unspecified: Secondary | ICD-10-CM | POA: Diagnosis not present

## 2021-09-01 DIAGNOSIS — N840 Polyp of corpus uteri: Secondary | ICD-10-CM | POA: Insufficient documentation

## 2021-09-01 DIAGNOSIS — Z6841 Body Mass Index (BMI) 40.0 and over, adult: Secondary | ICD-10-CM | POA: Diagnosis not present

## 2021-09-01 DIAGNOSIS — I129 Hypertensive chronic kidney disease with stage 1 through stage 4 chronic kidney disease, or unspecified chronic kidney disease: Secondary | ICD-10-CM | POA: Diagnosis not present

## 2021-09-01 DIAGNOSIS — N950 Postmenopausal bleeding: Secondary | ICD-10-CM | POA: Diagnosis not present

## 2021-09-01 DIAGNOSIS — E1122 Type 2 diabetes mellitus with diabetic chronic kidney disease: Secondary | ICD-10-CM | POA: Diagnosis not present

## 2021-09-01 DIAGNOSIS — E119 Type 2 diabetes mellitus without complications: Secondary | ICD-10-CM | POA: Insufficient documentation

## 2021-09-01 DIAGNOSIS — N184 Chronic kidney disease, stage 4 (severe): Secondary | ICD-10-CM | POA: Diagnosis not present

## 2021-09-01 DIAGNOSIS — D631 Anemia in chronic kidney disease: Secondary | ICD-10-CM | POA: Diagnosis not present

## 2021-09-01 DIAGNOSIS — N8501 Benign endometrial hyperplasia: Secondary | ICD-10-CM | POA: Diagnosis not present

## 2021-09-01 DIAGNOSIS — N939 Abnormal uterine and vaginal bleeding, unspecified: Secondary | ICD-10-CM | POA: Diagnosis not present

## 2021-09-01 DIAGNOSIS — Z794 Long term (current) use of insulin: Secondary | ICD-10-CM | POA: Diagnosis not present

## 2021-09-01 HISTORY — DX: Presence of spectacles and contact lenses: Z97.3

## 2021-09-01 HISTORY — DX: Postmenopausal bleeding: N95.0

## 2021-09-01 HISTORY — DX: Chronic kidney disease, stage 3 unspecified: N18.30

## 2021-09-01 HISTORY — PX: HYSTEROSCOPY WITH D & C: SHX1775

## 2021-09-01 HISTORY — DX: Anemia in chronic kidney disease: D63.1

## 2021-09-01 HISTORY — DX: Type 2 diabetes mellitus without complications: E11.9

## 2021-09-01 HISTORY — DX: Secondary hyperparathyroidism of renal origin: N25.81

## 2021-09-01 HISTORY — DX: Chronic kidney disease, unspecified: N18.9

## 2021-09-01 HISTORY — DX: Type 2 diabetes mellitus with unspecified diabetic retinopathy without macular edema: E11.319

## 2021-09-01 LAB — COMPREHENSIVE METABOLIC PANEL
ALT: 39 U/L (ref 0–44)
AST: 40 U/L (ref 15–41)
Albumin: 3.4 g/dL — ABNORMAL LOW (ref 3.5–5.0)
Alkaline Phosphatase: 109 U/L (ref 38–126)
Anion gap: 9 (ref 5–15)
BUN: 15 mg/dL (ref 8–23)
CO2: 27 mmol/L (ref 22–32)
Calcium: 8.4 mg/dL — ABNORMAL LOW (ref 8.9–10.3)
Chloride: 104 mmol/L (ref 98–111)
Creatinine, Ser: 1.33 mg/dL — ABNORMAL HIGH (ref 0.44–1.00)
GFR, Estimated: 44 mL/min — ABNORMAL LOW (ref >60)
Glucose, Bld: 101 mg/dL — ABNORMAL HIGH (ref 70–99)
Potassium: 3.2 mmol/L — ABNORMAL LOW (ref 3.5–5.1)
Sodium: 140 mmol/L (ref 135–145)
Total Bilirubin: 0.5 mg/dL (ref 0.3–1.2)
Total Protein: 7.2 g/dL (ref 6.5–8.1)

## 2021-09-01 LAB — CBC
HCT: 39.6 % (ref 36.0–46.0)
Hemoglobin: 12.4 g/dL (ref 12.0–15.0)
MCH: 26.7 pg (ref 26.0–34.0)
MCHC: 31.3 g/dL (ref 30.0–36.0)
MCV: 85.2 fL (ref 80.0–100.0)
Platelets: 271 10*3/uL (ref 150–400)
RBC: 4.65 MIL/uL (ref 3.87–5.11)
RDW: 15 % (ref 11.5–15.5)
WBC: 6.3 10*3/uL (ref 4.0–10.5)
nRBC: 0 % (ref 0.0–0.2)

## 2021-09-01 LAB — TYPE AND SCREEN
ABO/RH(D): A POS
Antibody Screen: NEGATIVE

## 2021-09-01 LAB — GLUCOSE, CAPILLARY
Glucose-Capillary: 98 mg/dL (ref 70–99)
Glucose-Capillary: 93 mg/dL (ref 70–99)

## 2021-09-01 LAB — ABO/RH: ABO/RH(D): A POS

## 2021-09-01 SURGERY — DILATATION AND CURETTAGE /HYSTEROSCOPY
Anesthesia: General

## 2021-09-01 MED ORDER — SODIUM CHLORIDE 0.9 % IV SOLN: INTRAVENOUS | Status: DC

## 2021-09-01 MED ORDER — PROPOFOL 500 MG/50ML IV EMUL
INTRAVENOUS | Status: AC
  Filled 2021-09-01: qty 50

## 2021-09-01 MED ORDER — LIDOCAINE HCL 1 % IJ SOLN
INTRAMUSCULAR | Status: DC | PRN
  Administered 2021-09-01: 6 mL

## 2021-09-01 MED ORDER — MIDAZOLAM HCL 5 MG/5ML IJ SOLN
INTRAMUSCULAR | Status: DC | PRN
  Administered 2021-09-01: 2 mg via INTRAVENOUS

## 2021-09-01 MED ORDER — ONDANSETRON HCL 4 MG/2ML IJ SOLN
INTRAMUSCULAR | Status: DC | PRN
  Administered 2021-09-01: 4 mg via INTRAVENOUS

## 2021-09-01 MED ORDER — OZEMPIC (1 MG/DOSE) 4 MG/3ML ~~LOC~~ SOPN: 1.0000 mg | PEN_INJECTOR | SUBCUTANEOUS | Status: DC

## 2021-09-01 MED ORDER — INSULIN REGULAR HUMAN 100 UNIT/ML IJ SOLN: INTRAMUSCULAR | Status: DC

## 2021-09-01 MED ORDER — PROPOFOL 10 MG/ML IV BOLUS
INTRAVENOUS | Status: DC | PRN
  Administered 2021-09-01: 150 mg via INTRAVENOUS

## 2021-09-01 MED ORDER — LIDOCAINE 2% (20 MG/ML) 5 ML SYRINGE
INTRAMUSCULAR | Status: DC | PRN
  Administered 2021-09-01: 100 mg via INTRAVENOUS

## 2021-09-01 MED ORDER — DEXAMETHASONE SODIUM PHOSPHATE 10 MG/ML IJ SOLN
INTRAMUSCULAR | Status: AC
  Filled 2021-09-01: qty 1

## 2021-09-01 MED ORDER — POVIDONE-IODINE 10 % EX SWAB: 2.0000 "application " | Freq: Once | CUTANEOUS | Status: DC

## 2021-09-01 MED ORDER — LEVOTHYROXINE SODIUM 137 MCG PO TABS
137.0000 ug | ORAL_TABLET | Freq: Every day | ORAL | Status: DC
Start: 2021-09-01 — End: 2021-12-31

## 2021-09-01 MED ORDER — LIDOCAINE 2% (20 MG/ML) 5 ML SYRINGE
INTRAMUSCULAR | Status: AC
  Filled 2021-09-01: qty 5

## 2021-09-01 MED ORDER — MIDAZOLAM HCL 2 MG/2ML IJ SOLN
INTRAMUSCULAR | Status: AC
  Filled 2021-09-01: qty 2

## 2021-09-01 MED ORDER — FENTANYL CITRATE (PF) 100 MCG/2ML IJ SOLN: 25.0000 ug | INTRAMUSCULAR | Status: DC | PRN

## 2021-09-01 MED ORDER — FENTANYL CITRATE (PF) 100 MCG/2ML IJ SOLN
INTRAMUSCULAR | Status: DC | PRN
  Administered 2021-09-01 (×2): 50 ug via INTRAVENOUS

## 2021-09-01 MED ORDER — CEFAZOLIN SODIUM-DEXTROSE 2-4 GM/100ML-% IV SOLN
INTRAVENOUS | Status: AC
  Filled 2021-09-01: qty 100

## 2021-09-01 MED ORDER — ACETAMINOPHEN 10 MG/ML IV SOLN: 1000.0000 mg | Freq: Once | INTRAVENOUS | Status: DC | PRN

## 2021-09-01 MED ORDER — CEFAZOLIN SODIUM-DEXTROSE 2-4 GM/100ML-% IV SOLN
2.0000 g | INTRAVENOUS | Status: AC
  Administered 2021-09-01: 10:00:00 2 g via INTRAVENOUS

## 2021-09-01 MED ORDER — ONDANSETRON HCL 4 MG/2ML IJ SOLN: 4.0000 mg | Freq: Once | INTRAMUSCULAR | Status: DC | PRN

## 2021-09-01 MED ORDER — DEXAMETHASONE SODIUM PHOSPHATE 10 MG/ML IJ SOLN
INTRAMUSCULAR | Status: DC | PRN
Start: 2021-09-01 — End: 2021-09-01
  Administered 2021-09-01: 4 mg via INTRAVENOUS

## 2021-09-01 MED ORDER — DEXTROSE-NACL 5-0.9 % IV SOLN: INTRAVENOUS | Status: DC

## 2021-09-01 MED ORDER — OXYCODONE HCL 5 MG PO TABS: 5.0000 mg | ORAL_TABLET | Freq: Three times a day (TID) | ORAL | 0 refills | Status: DC | PRN

## 2021-09-01 MED ORDER — SODIUM CHLORIDE 0.9 % IR SOLN
Status: DC | PRN
  Administered 2021-09-01: 6000 mL

## 2021-09-01 MED ORDER — ACETAMINOPHEN 500 MG PO TABS
ORAL_TABLET | ORAL | Status: AC
  Filled 2021-09-01: qty 2

## 2021-09-01 MED ORDER — ONDANSETRON HCL 4 MG/2ML IJ SOLN
INTRAMUSCULAR | Status: AC
  Filled 2021-09-01: qty 2

## 2021-09-01 MED ORDER — ACETAMINOPHEN 500 MG PO TABS
1000.0000 mg | ORAL_TABLET | ORAL | Status: AC
  Administered 2021-09-01: 09:00:00 1000 mg via ORAL

## 2021-09-01 MED ORDER — FENTANYL CITRATE (PF) 100 MCG/2ML IJ SOLN
INTRAMUSCULAR | Status: AC
  Filled 2021-09-01: qty 2

## 2021-09-01 SURGICAL SUPPLY — 19 items
CATH ROBINSON RED A/P 16FR (CATHETERS) ×3 IMPLANT
DEVICE MYOSURE LITE (MISCELLANEOUS) IMPLANT
DEVICE MYOSURE REACH (MISCELLANEOUS) ×2 IMPLANT
GAUZE 4X4 16PLY ~~LOC~~+RFID DBL (SPONGE) ×3 IMPLANT
GLOVE SURG ENC MOIS LTX SZ8 (GLOVE) ×3 IMPLANT
GLOVE SURG UNDER POLY LF SZ7 (GLOVE) ×6 IMPLANT
GOWN STRL REUS W/ TWL LRG LVL3 (GOWN DISPOSABLE) ×2 IMPLANT
GOWN STRL REUS W/TWL LRG LVL3 (GOWN DISPOSABLE) ×6
IV NS IRRIG 3000ML ARTHROMATIC (IV SOLUTION) ×4 IMPLANT
KIT PROCEDURE FLUENT (KITS) ×3 IMPLANT
KIT TURNOVER CYSTO (KITS) ×3 IMPLANT
MYOSURE XL FIBROID (MISCELLANEOUS)
PACK VAGINAL MINOR WOMEN LF (CUSTOM PROCEDURE TRAY) ×3 IMPLANT
PAD OB MATERNITY 4.3X12.25 (PERSONAL CARE ITEMS) ×3 IMPLANT
PAD PREP 24X48 CUFFED NSTRL (MISCELLANEOUS) ×3 IMPLANT
SEAL ROD LENS SCOPE MYOSURE (ABLATOR) ×3 IMPLANT
SLEEVE SCD COMPRESS KNEE MED (STOCKING) ×3 IMPLANT
SYSTEM TISS REMOVAL MYOSURE XL (MISCELLANEOUS) IMPLANT
TOWEL OR 17X26 10 PK STRL BLUE (TOWEL DISPOSABLE) ×6 IMPLANT

## 2021-09-01 NOTE — Op Note (Signed)
PREOPERATIVE DIAGNOSIS:  Postmenopausal uterine bleeding POSTOPERATIVE DIAGNOSIS: The same PROCEDURE: Hysteroscopy, Dilation and Curettage.  Myosure tissue resection SURGEON:  Dr. Lynnda Shields   INDICATIONS: 65 y.o. G0P0000  here for scheduled surgery for the aforementioned diagnoses.   Risks of surgery were discussed with the patient including but not limited to: bleeding which may require transfusion; infection which may require antibiotics; injury to uterus or surrounding organs; intrauterine scarring which may impair future fertility; need for additional procedures including laparotomy or laparoscopy; and other postoperative/anesthesia complications. Written informed consent was obtained.    FINDINGS:  A 8 week size uterus.  Diffuse thickened endometrium, Large polypoid material in upper uterus.  ANESTHESIA:   General, paracervical block with 30 ml of 0.5% Marcaine INTRAVENOUS FLUIDS:  500 ml of LR FLUID DEFICITS:  270 ml of NS ESTIMATED BLOOD LOSS:  10 ml UOP: 250 ml SPECIMENS: Endometrial curettings and Myosure samples sent to pathology COMPLICATIONS:  None immediate.  PROCEDURE DETAILS:  The patient was then taken to the operating room where general anesthesia was administered and was found to be adequate.  After an adequate timeout was performed, she was placed in the dorsal lithotomy position and examined; then prepped and draped in the sterile manner.   Her bladder was catheterized for 250 ml of clear, yellow urine. A speculum was then placed in the patient's vagina and a single tooth tenaculum was applied to the anterior lip of the cervix.   A paracervical block using 6 ml of 0.5% Marcaine was administered.  Pt had received cytotec prior to surgery and the cervix was already dilated 1.5 cm visually with a large clot or tissue in the cervical os.  The tissue was grasped and saved for pathology.  The uterus was sounded to 8 cm and  the 5 mm diagnostic hysteroscope was inserted without  incident.  The hysteroscope was then inserted under direct visualization using NS as a suspension medium.  The uterine cavity was carefully examined with the findings as noted above.   The myosure device was then used under direct visualization to shave down the thickened endometrium and to remove the large polypoid material.  After further careful visualization of the uterine cavity, the hysteroscope was removed under direct visualization.  A sharp curettage was then performed to obtain a moderate amount of endometrial curettings.  The polyp forceps were then used to help remove any residual material as well.    The tenaculum was removed from the anterior lip of the cervix and the vaginal speculum was removed after noting good hemostasis achieved with silver nitrate..  The patient tolerated the procedure well and was taken to the recovery area awake, extubated and in stable condition.   The patient will be discharged to home as per PACU criteria.  Routine postoperative instructions given.  She was prescribed a small rx of oxycodone  She will follow up in the clinic in 2 weeks  for postoperative evaluation.   Lynnda Shields, MD, [place] for [facility], [place]

## 2021-09-01 NOTE — Transfer of Care (Signed)
Immediate Anesthesia Transfer of Care Note  Patient: Courtney [name]  Procedure(s) Performed: DILATATION AND CURETTAGE Marlene Bast  Patient Location: PACU  Anesthesia Type:General  Level of Consciousness: drowsy and patient cooperative  Airway & Oxygen Therapy: Patient Spontanous Breathing and Patient connected to face mask oxygen  Post-op Assessment: Report given to RN and Post -op Vital signs reviewed and stable  Post vital signs: Reviewed and stable  Last Vitals:  Vitals Value Taken Time  BP 119/63 09/01/21 1015  Temp    Pulse 73 09/01/21 1017  Resp 12 09/01/21 1017  SpO2 96 % 09/01/21 1017  Vitals shown include unvalidated device data.  Last Pain:  Vitals:   09/01/21 0857  TempSrc: Oral  PainSc: 3       Patients Stated Pain Goal: 6 (19/91/44 [id number])  Complications: No notable events documented.

## 2021-09-01 NOTE — H&P (Signed)
OB/GYN Pre-Op History and Physical  Courtney [name] is a 65 y.o. G0P0000 presenting for hysteroscopy with dilation and curettage for postmenopausal bleeding.  She had menopause at age 25  and has had intermittent postmenopausal bleeding since 41 or 11.  Previous endometrial biopsy did not sure much endometrial tissue.      Past Medical History:  Diagnosis Date   Anemia associated with chronic renal failure    Arthritis    Chronic kidney disease (CKD), stage III (moderate) ([place])    nephrologist-- dr s. Juleen China   Diabetic retinopathy of both eyes ([place])    followed by dr c. haddad in [place];  pt stated gets eye injections every 6 months   History of COVID-19 2020   per pt mild to moderate symptoms , no hospital admission,  symptoms resolved with exceptin taste/ smell still not right   History of thyroid cancer 09/2002   endocrinologist--- dr Cruzita Lederer;  s/p total thyroidectomy [id number] for multinodular goiter, dx follicular varient papillary thyroid cancer, no chemo/ radiation;   RAI for normal bx of mass in 2005;  no recurrence   Hx MRSA infection 06/2012   right thigh abscess with sepsis, hospital admission   Hyperlipidemia    Hypertension    Hypothyroidism, postsurgical 2004   endocrinologist-  dr Cruzita Lederer;   10-03-2002  s/p total thyroidectomy for multinodular goiter/ thyroid cancer   Insulin dependent type 2 diabetes mellitus [facility])    endocrinologist--- dr Cruzita Lederer;    (08-27-2021 per pt does not check blood sugar at home, only every once in a while)   Neuropathy due to secondary diabetes ([place])    PMB (postmenopausal bleeding)    Secondary hyperparathyroidism of renal origin [facility])    Wears glasses     Past Surgical History:  Procedure Laterality Date   APPLICATION OF WOUND VAC     07-11-2012 and 07-14-2012 @[facility] ;   to right thigh wound with dressing change   BILATERAL CARPAL TUNNEL RELEASE Bilateral 2003   CATARACT EXTRACTION W/ INTRAOCULAR LENS IMPLANT Bilateral    left  2016;  right 2018   CHOLECYSTECTOMY OPEN     1990s   COLONOSCOPY WITH PROPOFOL N/A 05/14/2020   Procedure: COLONOSCOPY WITH PROPOFOL;  Surgeon: Lin Landsman, MD;  Location: [facility];  Service: Gastroenterology;  Laterality: N/A;   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   INCISION AND DRAINAGE ABSCESS Right 07/08/2012   @[facility] ;  w/ excisional debridement right thigh   KNEE ARTHROSCOPY Left 05/05/2016   Procedure: ARTHROSCOPY KNEE;  Surgeon: Thornton Park, MD;  Location: [facility];  Service: Orthopedics;  Laterality: Left;   SHOULDER ARTHROSCOPY Left 03/2002   TOTAL THYROIDECTOMY Bilateral 10/03/2002   @[facility]    TRIGGER FINGER RELEASE     1990s ;   right hand    OB History  Gravida Para Term Preterm AB Living  0 0 0 0 0 0  SAB IAB Ectopic Multiple Live Births  0 0 0 0 0    Social History   Socioeconomic History   Marital status: Single    Spouse name: n/a   Number of children: 0   Years of education: 12th grade    Highest education level: High school graduate  Occupational History   Occupation: retired  Tobacco Use   Smoking status: Former    Years: 0.50    Types: Cigarettes    Quit date: 1978    Years since quitting: 45.0   Smokeless tobacco: Never  Vaping Use  Vaping Use: Never used  Substance and Sexual Activity   Alcohol use: Yes    Alcohol/week: 0.0 standard drinks    Comment: occassional   Drug use: Never   Sexual activity: Not on file  Other Topics Concern   Not on file  Social History Narrative   Single   0 children   Merchandise sales (travels regularly)      Beer and wine on occasion   First menstrual cycle: 6th grade   Postmenopausal   Social Determinants of Radio broadcast assistant Strain: Low Risk    Difficulty of Paying Living Expenses: Not very hard  Food Insecurity: No Food Insecurity   Worried About Charity fundraiser in the Last Year: Never true   Ran Out of Food in the Last Year: Never true  Transportation Needs: No  Transportation Needs   Lack of Transportation (Medical): No   Lack of Transportation (Non-Medical): No  Physical Activity: Not on file  Stress: No Stress Concern Present   Feeling of Stress : Only a little  Social Connections: Moderately Integrated   Frequency of Communication with Friends and Family: More than three times a week   Frequency of Social Gatherings with Friends and Family: More than three times a week   Attends Religious Services: More than 4 times per year   Active Member of [employer] or Organizations: Yes   Attends Music therapist: More than 4 times per year   Marital Status: Never married    Family History  Problem Relation Age of Onset   Hypertension Mother    Thyroid disease Mother    CVA Mother    Hypertension Sister    Thyroid disease Sister    Hypertension Brother    Hyperlipidemia Brother     (Not in a hospital admission)   Allergies  Allergen Reactions   Lisinopril Cough   Meloxicam Other (See Comments)    Joint pain   Prednisone Rash    Review of Systems: Negative except for what is mentioned in HPI.     Physical Exam: BP 140/67    Pulse 75    Wt 260 lb 3.2 oz (118 kg)    BMI 40.75 kg/m  CONSTITUTIONAL: Well-developed, well-nourished female in no acute distress.  HENT:  Normocephalic, atraumatic, External right and left ear normal. Oropharynx is clear and moist EYES: Conjunctivae and EOM are normal.  NECK: Normal range of motion, supple, no masses SKIN: Skin is warm and dry. No rash noted. Not diaphoretic. No erythema. No pallor. [place]: Alert and oriented to person, place, and time. Normal reflexes, muscle tone coordination. No cranial nerve deficit noted. PSYCHIATRIC: Normal mood and affect. Normal behavior. Normal judgment and thought content. CARDIOVASCULAR: Normal heart rate noted, regular rhythm RESPIRATORY: Effort and breath sounds normal, no problems with respiration noted ABDOMEN: Soft, nontender,  nondistended PELVIC: Deferred MUSCULOSKELETAL: Normal range of motion. No edema and no tenderness. 2+ distal pulses. CBC Latest Ref Rng & Units 09/01/2021 04/27/2016 07/15/2012  WBC 4.0 - 10.5 K/uL 6.3 5.5 -  Hemoglobin 12.0 - 15.0 g/dL 12.4 11.1(L) -  Hematocrit 36.0 - 46.0 % 39.6 34.2(L) -  Platelets 150 - 400 K/uL 271 219 293     CMP Latest Ref Rng & Units 09/01/2021 08/26/2021 06/25/2018  Glucose 70 - 99 mg/dL 101(H) 100(H) 78  BUN 8 - 23 mg/dL 15 20 21   Creatinine 0.44 - 1.00 mg/dL 1.33(H) 1.71(H) 1.44(H)  Sodium 135 - 145 mmol/L 140 146(H) 141  Potassium 3.5 - 5.1 mmol/L 3.2(L) 3.7 4.0  Chloride 98 - 111 mmol/L 104 104 107  CO2 22 - 32 mmol/L 27 32 27  Calcium 8.9 - 10.3 mg/dL 8.4(L) 8.7 9.0  Total Protein 6.5 - 8.1 g/dL 7.2 - 6.9  Total Bilirubin 0.3 - 1.2 mg/dL 0.5 - 0.4  Alkaline Phos 38 - 126 U/L 109 - -  AST 15 - 41 U/L 40 - 22  ALT 0 - 44 U/L 39 - 24     Pertinent Labs/Studies:   CLINICAL DATA:  Postmenopausal bleeding off and on since 2019   EXAM: TRANSABDOMINAL AND TRANSVAGINAL ULTRASOUND OF PELVIS   TECHNIQUE: Both transabdominal and transvaginal ultrasound examinations of the pelvis were performed. Transabdominal technique was performed for global imaging of the pelvis including uterus, ovaries, adnexal regions, and pelvic cul-de-sac. It was necessary to proceed with endovaginal exam following the transabdominal exam to visualize the uterus, endometrium, and ovaries.   COMPARISON:  07/04/2018   FINDINGS: Uterus   Measurements: 9.2 x 4.8 x 5.9 cm = volume: 134 mL. Anteverted. Slightly heterogeneous myometrium. Subtle subserosal leiomyoma anteriorly 2.3 x 2.1 x 2.0 cm. Additional intramural leiomyoma at fundus 3.7 x 3.6 x 3.4 cm.   Endometrium   Thickness: 19 mm. Abnormal, thickened, heterogeneous. Several small cystic foci. Probable small amount of endometrial fluid at fundal portion of endometrial canal. No discrete mass.   Right ovary    Measurements: 3.4 x 1.9 x 1.6 cm = volume: 5.4 mL. Normal morphology without mass   Left ovary   Measurements: 3.2 x 2.5 x 2.0 cm = volume: 8.4 mL. Normal morphology without mass   Other findings   No free pelvic fluid.  No adnexal masses.   IMPRESSION: Thickened heterogeneous endometrial complex 19 mm thick; endometrial thickness is considered abnormal for an asymptomatic post-menopausal female and endometrial sampling should be considered to exclude carcinoma.   Two uterine leiomyomata as above.   Unremarkable ovaries and adnexa.         Assessment and Plan :MCKINZI ERIKSEN is a 65 y.o. G0P0000 here for hysteroscopy dilation and curettage secondary to postmenopausal bleeding.   Plan for hysteroscopy D and c NPO Admission labs ordered VS Q4 Pt did use preoperative cytotec.   All risks and benefits explained including bleeding, infection, involvement of other organs and uterine perforation.  Pt wishes to proceed.   Lynnda Shields, M.D. Attending [place], [facility] for [employer], [place]

## 2021-09-01 NOTE — Anesthesia Procedure Notes (Signed)
Procedure Name: LMA Insertion Date/Time: 09/01/2021 9:34 AM Performed by: Genelle Bal, CRNA Pre-anesthesia Checklist: Patient identified, Emergency Drugs available, Suction available and Patient being monitored Patient Re-evaluated:Patient Re-evaluated prior to induction Oxygen Delivery Method: Circle system utilized Preoxygenation: Pre-oxygenation with 100% oxygen Induction Type: IV induction Ventilation: Mask ventilation without difficulty LMA: LMA inserted LMA Size: 4.0 Number of attempts: 1 Airway Equipment and Method: Bite block Placement Confirmation: positive ETCO2 Tube secured with: Tape Dental Injury: Teeth and Oropharynx as per pre-operative assessment

## 2021-09-01 NOTE — Interval H&P Note (Signed)
History and Physical Interval Note:  09/01/2021 9:26 AM  Courtney [name]  has presented today for surgery, with the diagnosis of PMB.  The various methods of treatment have been discussed with the patient and family. After consideration of risks, benefits and other options for treatment, the patient has consented to  Procedure(s): DILATATION AND CURETTAGE /HYSTEROSCOPY (N/A) as a surgical intervention.  The patient's history has been reviewed, patient examined, no change in status, stable for surgery.  I have reviewed the patient's chart and labs.  Questions were answered to the patient's satisfaction.     Griffin Basil

## 2021-09-01 NOTE — Anesthesia Postprocedure Evaluation (Signed)
Anesthesia Post Note  Patient: Courtney [name]  Procedure(s) Performed: DILATATION AND CURETTAGE Marlene Bast     Patient location during evaluation: PACU Anesthesia Type: General Level of consciousness: awake and alert Pain management: pain level controlled Vital Signs Assessment: post-procedure vital signs reviewed and stable Respiratory status: spontaneous breathing, nonlabored ventilation, respiratory function stable and patient connected to nasal cannula oxygen Cardiovascular status: blood pressure returned to baseline and stable Postop Assessment: no apparent nausea or vomiting Anesthetic complications: no   No notable events documented.  Last Vitals:  Vitals:   09/01/21 1030 09/01/21 1045  BP: 130/64 140/73  Pulse: 76   Resp: 16   Temp:  36.6 C  SpO2: 98%     Last Pain:  Vitals:   09/01/21 1030  TempSrc:   PainSc: 0-No pain                 Barnet Glasgow

## 2021-09-01 NOTE — Discharge Instructions (Signed)
No acetaminophen/Tylenol until after 2:30pm today if needed for pain.   DISCHARGE INSTRUCTIONS: D&C  The following instructions have been prepared to help you care for yourself upon your return home.   Personal hygiene:  Use sanitary pads for vaginal drainage, not tampons.  Shower the day after your procedure.  NO tub baths, pools or Jacuzzis for 2-3 weeks.  Wipe front to back after using the bathroom.  Activity and limitations:  Do NOT drive or operate any equipment for 24 hours. The effects of anesthesia are still present and drowsiness may result.  Do NOT rest in bed all day.  Walking is encouraged.  Walk up and down stairs slowly.  You may resume your normal activity in one to two days or as indicated by your physician.  Sexual activity: NO intercourse for at least 2 weeks after the procedure, or as indicated by your physician.  Diet: Eat a light meal as desired this evening. You may resume your usual diet tomorrow.  Return to work: You may resume your work activities in one to two days or as indicated by your doctor.  What to expect after your surgery: Expect to have vaginal bleeding/discharge for 2-3 days and spotting for up to 10 days. It is not unusual to have soreness for up to 1-2 weeks. You may have a slight burning sensation when you urinate for the first day. Mild cramps may continue for a couple of days. You may have a regular period in 2-6 weeks.  Call your doctor for any of the following:  Excessive vaginal bleeding, saturating and changing one pad every hour.  Inability to urinate 6 hours after discharge from hospital.  Pain not relieved by pain medication.  Fever of 100.4 F or greater.  Unusual vaginal discharge or odor.    Post Anesthesia Home Care Instructions  Activity: Get plenty of rest for the remainder of the day. A responsible individual must stay with you for 24 hours following the procedure.  For the next 24 hours, DO NOT: -Drive a car -Conservation officer, nature -Drink alcoholic beverages -Take any medication unless instructed by your physician -Make any legal decisions or sign important papers.  Meals: Start with liquid foods such as gelatin or soup. Progress to regular foods as tolerated. Avoid greasy, spicy, heavy foods. If nausea and/or vomiting occur, drink only clear liquids until the nausea and/or vomiting subsides. Call your physician if vomiting continues.  Special Instructions/Symptoms: Your throat may feel dry or sore from the anesthesia or the breathing tube placed in your throat during surgery. If this causes discomfort, gargle with warm salt water. The discomfort should disappear within 24 hours.

## 2021-09-01 NOTE — H&P (View-Only) (Signed)
OB/GYN Pre-Op History and Physical  MARCILE FUQUAY is a 65 y.o. G0P0000 presenting for hysteroscopy with dilation and curettage for postmenopausal bleeding.  She had menopause at age 73  and has had intermittent postmenopausal bleeding since 44 or 61.  Previous endometrial biopsy did not sure much endometrial tissue.      Past Medical History:  Diagnosis Date   Anemia associated with chronic renal failure    Arthritis    Chronic kidney disease (CKD), stage III (moderate) ([place])    nephrologist-- dr s. Juleen China   Diabetic retinopathy of both eyes ([place])    followed by dr c. haddad in [place];  pt stated gets eye injections every 6 months   History of COVID-19 2020   per pt mild to moderate symptoms , no hospital admission,  symptoms resolved with exceptin taste/ smell still not right   History of thyroid cancer 09/2002   endocrinologist--- dr Cruzita Lederer;  s/p total thyroidectomy [id number] for multinodular goiter, dx follicular varient papillary thyroid cancer, no chemo/ radiation;   RAI for normal bx of mass in 2005;  no recurrence   Hx MRSA infection 06/2012   right thigh abscess with sepsis, hospital admission   Hyperlipidemia    Hypertension    Hypothyroidism, postsurgical 2004   endocrinologist-  dr Cruzita Lederer;   10-03-2002  s/p total thyroidectomy for multinodular goiter/ thyroid cancer   Insulin dependent type 2 diabetes mellitus [facility])    endocrinologist--- dr Cruzita Lederer;    (08-27-2021 per pt does not check blood sugar at home, only every once in a while)   Neuropathy due to secondary diabetes ([place])    PMB (postmenopausal bleeding)    Secondary hyperparathyroidism of renal origin [facility])    Wears glasses     Past Surgical History:  Procedure Laterality Date   APPLICATION OF WOUND VAC     07-11-2012 and 07-14-2012 @[facility] ;   to right thigh wound with dressing change   BILATERAL CARPAL TUNNEL RELEASE Bilateral 2003   CATARACT EXTRACTION W/ INTRAOCULAR LENS IMPLANT Bilateral    left  2016;  right 2018   CHOLECYSTECTOMY OPEN     1990s   COLONOSCOPY WITH PROPOFOL N/A 05/14/2020   Procedure: COLONOSCOPY WITH PROPOFOL;  Surgeon: Lin Landsman, MD;  Location: [place];  Service: Gastroenterology;  Laterality: N/A;   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   INCISION AND DRAINAGE ABSCESS Right 07/08/2012   @[facility] ;  w/ excisional debridement right thigh   KNEE ARTHROSCOPY Left 05/05/2016   Procedure: ARTHROSCOPY KNEE;  Surgeon: Thornton Park, MD;  Location: [facility];  Service: Orthopedics;  Laterality: Left;   SHOULDER ARTHROSCOPY Left 03/2002   TOTAL THYROIDECTOMY Bilateral 10/03/2002   @[facility]    TRIGGER FINGER RELEASE     1990s ;   right hand    OB History  Gravida Para Term Preterm AB Living  0 0 0 0 0 0  SAB IAB Ectopic Multiple Live Births  0 0 0 0 0    Social History   Socioeconomic History   Marital status: Single    Spouse name: n/a   Number of children: 0   Years of education: 12th grade    Highest education level: High school graduate  Occupational History   Occupation: retired  Tobacco Use   Smoking status: Former    Years: 0.50    Types: Cigarettes    Quit date: 1978    Years since quitting: 45.0   Smokeless tobacco: Never  Vaping Use  Vaping Use: Never used  Substance and Sexual Activity   Alcohol use: Yes    Alcohol/week: 0.0 standard drinks    Comment: occassional   Drug use: Never   Sexual activity: Not on file  Other Topics Concern   Not on file  Social History Narrative   Single   0 children   Merchandise sales (travels regularly)      Beer and wine on occasion   First menstrual cycle: 6th grade   Postmenopausal   Social Determinants of Radio broadcast assistant Strain: Low Risk    Difficulty of Paying Living Expenses: Not very hard  Food Insecurity: No Food Insecurity   Worried About Charity fundraiser in the Last Year: Never true   Ran Out of Food in the Last Year: Never true  Transportation Needs: No  Transportation Needs   Lack of Transportation (Medical): No   Lack of Transportation (Non-Medical): No  Physical Activity: Not on file  Stress: No Stress Concern Present   Feeling of Stress : Only a little  Social Connections: Moderately Integrated   Frequency of Communication with Friends and Family: More than three times a week   Frequency of Social Gatherings with Friends and Family: More than three times a week   Attends Religious Services: More than 4 times per year   Active Member of [employer] or Organizations: Yes   Attends Music therapist: More than 4 times per year   Marital Status: Never married    Family History  Problem Relation Age of Onset   Hypertension Mother    Thyroid disease Mother    CVA Mother    Hypertension Sister    Thyroid disease Sister    Hypertension Brother    Hyperlipidemia Brother     (Not in a hospital admission)   Allergies  Allergen Reactions   Lisinopril Cough   Meloxicam Other (See Comments)    Joint pain   Prednisone Rash    Review of Systems: Negative except for what is mentioned in HPI.     Physical Exam: BP 140/67    Pulse 75    Wt 260 lb 3.2 oz (118 kg)    BMI 40.75 kg/m  CONSTITUTIONAL: Well-developed, well-nourished female in no acute distress.  HENT:  Normocephalic, atraumatic, External right and left ear normal. Oropharynx is clear and moist EYES: Conjunctivae and EOM are normal.  NECK: Normal range of motion, supple, no masses SKIN: Skin is warm and dry. No rash noted. Not diaphoretic. No erythema. No pallor. [place]: Alert and oriented to person, place, and time. Normal reflexes, muscle tone coordination. No cranial nerve deficit noted. PSYCHIATRIC: Normal mood and affect. Normal behavior. Normal judgment and thought content. CARDIOVASCULAR: Normal heart rate noted, regular rhythm RESPIRATORY: Effort and breath sounds normal, no problems with respiration noted ABDOMEN: Soft, nontender,  nondistended PELVIC: Deferred MUSCULOSKELETAL: Normal range of motion. No edema and no tenderness. 2+ distal pulses. CBC Latest Ref Rng & Units 09/01/2021 04/27/2016 07/15/2012  WBC 4.0 - 10.5 K/uL 6.3 5.5 -  Hemoglobin 12.0 - 15.0 g/dL 12.4 11.1(L) -  Hematocrit 36.0 - 46.0 % 39.6 34.2(L) -  Platelets 150 - 400 K/uL 271 219 293     CMP Latest Ref Rng & Units 09/01/2021 08/26/2021 06/25/2018  Glucose 70 - 99 mg/dL 101(H) 100(H) 78  BUN 8 - 23 mg/dL 15 20 21   Creatinine 0.44 - 1.00 mg/dL 1.33(H) 1.71(H) 1.44(H)  Sodium 135 - 145 mmol/L 140 146(H) 141  Potassium 3.5 - 5.1 mmol/L 3.2(L) 3.7 4.0  Chloride 98 - 111 mmol/L 104 104 107  CO2 22 - 32 mmol/L 27 32 27  Calcium 8.9 - 10.3 mg/dL 8.4(L) 8.7 9.0  Total Protein 6.5 - 8.1 g/dL 7.2 - 6.9  Total Bilirubin 0.3 - 1.2 mg/dL 0.5 - 0.4  Alkaline Phos 38 - 126 U/L 109 - -  AST 15 - 41 U/L 40 - 22  ALT 0 - 44 U/L 39 - 24     Pertinent Labs/Studies:   CLINICAL DATA:  Postmenopausal bleeding off and on since 2019   EXAM: TRANSABDOMINAL AND TRANSVAGINAL ULTRASOUND OF PELVIS   TECHNIQUE: Both transabdominal and transvaginal ultrasound examinations of the pelvis were performed. Transabdominal technique was performed for global imaging of the pelvis including uterus, ovaries, adnexal regions, and pelvic cul-de-sac. It was necessary to proceed with endovaginal exam following the transabdominal exam to visualize the uterus, endometrium, and ovaries.   COMPARISON:  07/04/2018   FINDINGS: Uterus   Measurements: 9.2 x 4.8 x 5.9 cm = volume: 134 mL. Anteverted. Slightly heterogeneous myometrium. Subtle subserosal leiomyoma anteriorly 2.3 x 2.1 x 2.0 cm. Additional intramural leiomyoma at fundus 3.7 x 3.6 x 3.4 cm.   Endometrium   Thickness: 19 mm. Abnormal, thickened, heterogeneous. Several small cystic foci. Probable small amount of endometrial fluid at fundal portion of endometrial canal. No discrete mass.   Right ovary    Measurements: 3.4 x 1.9 x 1.6 cm = volume: 5.4 mL. Normal morphology without mass   Left ovary   Measurements: 3.2 x 2.5 x 2.0 cm = volume: 8.4 mL. Normal morphology without mass   Other findings   No free pelvic fluid.  No adnexal masses.   IMPRESSION: Thickened heterogeneous endometrial complex 19 mm thick; endometrial thickness is considered abnormal for an asymptomatic post-menopausal female and endometrial sampling should be considered to exclude carcinoma.   Two uterine leiomyomata as above.   Unremarkable ovaries and adnexa.         Assessment and Plan :LAKELY ELMENDORF is a 65 y.o. G0P0000 here for hysteroscopy dilation and curettage secondary to postmenopausal bleeding.   Plan for hysteroscopy D and c NPO Admission labs ordered VS Q4 Pt did use preoperative cytotec.   All risks and benefits explained including bleeding, infection, involvement of other organs and uterine perforation.  Pt wishes to proceed.   Lynnda Shields, M.D. Attending [place], [facility] for [employer], [place]

## 2021-09-02 ENCOUNTER — Encounter (HOSPITAL_BASED_OUTPATIENT_CLINIC_OR_DEPARTMENT_OTHER): Payer: Self-pay | Admitting: Obstetrics and Gynecology

## 2021-09-02 LAB — SURGICAL PATHOLOGY

## 2021-09-15 ENCOUNTER — Other Ambulatory Visit: Payer: Self-pay

## 2021-09-15 ENCOUNTER — Encounter: Payer: Self-pay | Admitting: *Deleted

## 2021-09-15 ENCOUNTER — Other Ambulatory Visit: Payer: Self-pay | Admitting: *Deleted

## 2021-09-15 ENCOUNTER — Ambulatory Visit (INDEPENDENT_AMBULATORY_CARE_PROVIDER_SITE_OTHER): Payer: Medicare HMO | Admitting: Obstetrics and Gynecology

## 2021-09-15 ENCOUNTER — Encounter: Payer: Self-pay | Admitting: Obstetrics and Gynecology

## 2021-09-15 DIAGNOSIS — Z4889 Encounter for other specified surgical aftercare: Secondary | ICD-10-CM | POA: Diagnosis not present

## 2021-09-15 DIAGNOSIS — N8501 Benign endometrial hyperplasia: Secondary | ICD-10-CM | POA: Diagnosis not present

## 2021-09-15 MED ORDER — MEDROXYPROGESTERONE ACETATE 10 MG PO TABS: 10.0000 mg | ORAL_TABLET | Freq: Every day | ORAL | 8 refills | Status: DC

## 2021-09-15 MED ORDER — MISOPROSTOL 100 MCG PO TABS: ORAL_TABLET | ORAL | 0 refills | Status: DC

## 2021-09-15 NOTE — Patient Outreach (Signed)
[facility]) Care Management Telephonic RN Care Manager Note   09/15/2021 Name:  Courtney [name] MRN:  [id number] DOB:  1956/07/10  Summary: 2023 follow up- 2023 insurance coverage, THN eligibility, diabetes management, medication management, appointments Ms Egolf was not sure after she turned 35 which medicare C coverage she would choose She confirms today she chose [place] for 2023 She did report some medication management concerns during the months of November/December 2022 but was able to receive assistance from her pcp office [facility]) pharmacy staff, Ovid Curd for samples to resolve the issues until her [place] became effective  Since the last outreach she had a polyp removed which was felt to be the origin of her voiced concern with her fatigue symptoms Dx Simple endometrial hyperplasia She reports she still has some fatigue but improved Spoke with Dr Elgie Congo today 09/15/20 who provided 3 treatment options Given provera to take it daily for 6 months then scheduled for a follow up visit  Upcoming appointments reviewed  Eye MD 09/27/20 had [place] [facility]  regular eye MD on 09/21/20  Diabetes She reports she did have some elevations recently in cbgs  Jardiance drying her out mouth dry feel like cotton   Had extra care benefits from new coverage    Recommendations/Changes made from today's visit: Assessed for 2023 coverage, worsening symptoms THN progression discussed for 2023 and she agrees to continue [facility] complex for a quarter then transfer to [facility] disease management on a quarterly basis for health coach services    Subjective: Courtney [name] is an 66 y.o. year old female who is a primary patient of Cyndi Bender, [place]. The care management team was consulted for assistance with care management and/or care coordination needs.    Telephonic RN Care Manager completed Telephone Visit today.   Objective:  Medications Reviewed Today     Reviewed by Griffin Basil, MD (Physician) on 09/15/21 at [place] List Status: <None>   Medication Order Taking? Sig Documenting Provider Last Dose Status Informant  Acetaminophen (TYLENOL) 325 MG CAPS [id number] Yes Take by mouth as needed. [provider] Taking Active Self  allopurinol (ZYLOPRIM) 100 MG tablet [id number] Yes Take 100 mg by mouth daily. [provider] Taking Active Self  amLODipine (NORVASC) 10 MG tablet [id number] Yes Take 10 mg by mouth daily. [provider] Taking Active Self           Med Note Luana Shu, [place] R   Tue Apr 26, 2016  9:55 AM)    Cholecalciferol 125 MCG (5000 UT) capsule [id number] Yes Take 5,000 Units by mouth once a week. [provider] Taking Active Self  diphenhydrAMINE (BENADRYL) 50 MG tablet [id number] Yes Take 50 mg by mouth as needed. [provider] Taking Active Self  empagliflozin (JARDIANCE) 10 MG TABS tablet [id number] Yes Take 10 mg by mouth daily. [provider] Taking Active Self  insulin regular (NOVOLIN R RELION) 100 units/mL injection [id number] Yes INJECT 30 UNITS SUB-Q before b'fas. ReliOn insulin.  AND 25 UNITS BEFORE SUPPER Griffin Basil, MD Taking Active   Insulin Syringe-Needle U-100 31G X 15/64" 0.5 ML MISC [id number] Yes Use 3x a day [name], MD Taking Active   levothyroxine (EUTHYROX) 137 MCG tablet [id number] Yes Take 1 tablet (137 mcg total) by mouth daily before breakfast. Griffin Basil, MD Taking Active   lovastatin (MEVACOR) 20 MG tablet [id number] Yes Take 20 mg by mouth daily. [provider] Taking Active Self  oxyCODONE (OXY IR/ROXICODONE) 5 MG immediate release tablet [id number] Yes Take 1 tablet (5 mg total) by mouth every 8 (eight) hours as needed for severe pain or breakthrough pain. Griffin Basil, MD Taking Active   Polyvinyl Alcohol-Povidone [facility] OP) [id number] Yes Apply to eye as needed. [provider] Taking Active Self  Semaglutide, 1 MG/DOSE,  (OZEMPIC, 1 MG/DOSE,) 4 MG/3ML SOPN [id number] Yes Inject 1 mg into the skin once a week. Sunday'S Griffin Basil, MD Taking Active   torsemide (DEMADEX) 20 MG tablet [id number] Yes Take 20 mg by mouth daily. [provider] Taking Active Self  TRESIBA FLEXTOUCH 200 UNIT/ML FlexTouch Pen [id number] Yes INJECT 93 UNITS SUBCUTANEOUSLY ONCE DAILY  Patient taking differently: 70 Units daily. INJECT 75 UNITS SUBCUTANEOUSLY ONCE DAILY   [name], MD Taking Active Self           Patient Active Problem List   Diagnosis Date Noted   Encounter for postoperative care 09/15/2021   Simple endometrial hyperplasia 09/15/2021   Other hyperlipidemia 08/26/2021   Anemia in chronic kidney disease 10/12/2020   Chronic kidney disease, stage IV (severe) ([place]) 10/12/2020   Secondary hyperparathyroidism of renal origin ([place]) 10/12/2020   Benign hypertensive kidney disease with chronic kidney disease 07/01/2020   Proteinuria 04/27/2020   Adult BMI 30+ 04/27/2020   Abscess of neck 03/09/2020   Abscess of groin 03/09/2020   Abscess of thigh 03/09/2020   Abscess of vulva 03/09/2020   Absolute anemia 03/09/2020   Benign essential HTN 03/09/2020   Cellulitis and abscess of face 03/09/2020   Chronic kidney disease, stage 3b ([place]) 03/09/2020   Combined fat and carbohydrate induced hyperlipemia 03/09/2020   Hand discomfort 03/09/2020   Renal mass 03/09/2020   Leg ulcer ([place]) 03/09/2020   Encounter for screening colonoscopy 03/09/2020   Endometrial cells on cervical Pap smear inconsistent w/LMP 06/28/2018   PMB (postmenopausal bleeding) 06/28/2018   Poorly controlled type 2 diabetes mellitus with peripheral neuropathy ([place]) 06/25/2018   Post-surgical hypothyroidism 12/26/2014   Diabetes mellitus type 2, uncontrolled 08/28/2014   Thyroid cancer ([place]) 08/28/2014     SDOH:  (Social Determinants of Health) assessments and interventions performed:    Care Plan  Review of patient past  medical history, allergies, medications, health status, including review of consultants reports, laboratory and other test data, was performed as part of comprehensive evaluation for care management services.   There are no care plans that you recently modified to display for this patient.    Plan: The patient has been provided with contact information for the care management team and has been advised to call with any health related questions or concerns.  The care management team will reach out to the patient again over the next 30 business days.  Flem Enderle L. Lavina Hamman, RN, BSN, [place] Coordinator Office number [phone] [facility] number [phone] Fax number [phone]

## 2021-09-15 NOTE — Progress Notes (Signed)
° ° °  Subjective:    Courtney [name] is a 66 y.o. female who presents to the clinic status post hysteroscopy, dilation and curettage and removal of endometrial polyp on 09/01/21. The patient is not having any pain.  Eating a regular diet without difficulty. Bowel movements are normal. No other significant postoperative concerns.  Pt noted 5-8 days of bleeding after the procedure but none now.  The following portions of the patient's history were reviewed and updated as appropriate: allergies, current medications, past family history, past medical history, past social history, past surgical history, and problem list..  Last pap smear was normal  on 04/27/20.  Review of Systems Pertinent items are noted in HPI.   Objective:   BP 122/68    Pulse 81    Wt 253 lb (114.8 kg)    BMI 39.63 kg/m  Constitutional:  Well-developed, well-nourished female in no acute distress.   Skin: Skin is warm and dry, no rash noted, not diaphoretic,no erythema, no pallor.  Cardiovascular: Normal heart rate noted  Respiratory: Effort and breath sounds normal, no problems with respiration noted  Abdomen: Soft, bowel sounds active, non-tender, no abnormal masses  Incision: N/a  Pelvic:   Deferred   Surgical pathology (endometrial excision and curettage) Findings c/w polyp along with focal simple hyperplasia without atypia No overt endometrial neoplasia Assessment:   Doing well postoperatively.  Operative findings again reviewed. Pathology report discussed.   Plan:   1. Continue any current medications. 2. Wound care discussed. 3. Activity restrictions: none 4. Anticipated return to work: now. 5. Follow up as needed Discussed need for progestin therapy versus hysterectomy.  Offered progesterone IUD versus oral progestins.  Pt desires oral progestins.  Emphasized need for endometrial biopsy at 6 and 12 months.Plan of care briefly discussed discussed with gyn onc who agrees with current plan.  6.  Routine  preventative health maintenance measures emphasized. Please refer to After Visit Summary for other counseling recommendations.    Lynnda Shields, MD, [place] Attending [place] for [facility], [place]

## 2021-09-21 DIAGNOSIS — Z794 Long term (current) use of insulin: Secondary | ICD-10-CM | POA: Diagnosis not present

## 2021-09-21 DIAGNOSIS — H5201 Hypermetropia, right eye: Secondary | ICD-10-CM | POA: Diagnosis not present

## 2021-09-21 DIAGNOSIS — H52223 Regular astigmatism, bilateral: Secondary | ICD-10-CM | POA: Diagnosis not present

## 2021-09-21 DIAGNOSIS — H5212 Myopia, left eye: Secondary | ICD-10-CM | POA: Diagnosis not present

## 2021-09-27 DIAGNOSIS — E113513 Type 2 diabetes mellitus with proliferative diabetic retinopathy with macular edema, bilateral: Secondary | ICD-10-CM | POA: Diagnosis not present

## 2021-09-28 DIAGNOSIS — Z0100 Encounter for examination of eyes and vision without abnormal findings: Secondary | ICD-10-CM | POA: Diagnosis not present

## 2021-10-13 DIAGNOSIS — E113513 Type 2 diabetes mellitus with proliferative diabetic retinopathy with macular edema, bilateral: Secondary | ICD-10-CM | POA: Diagnosis not present

## 2021-10-19 ENCOUNTER — Other Ambulatory Visit: Payer: Self-pay

## 2021-10-19 ENCOUNTER — Encounter: Payer: Self-pay | Admitting: *Deleted

## 2021-10-19 ENCOUNTER — Other Ambulatory Visit: Payer: Self-pay | Admitting: *Deleted

## 2021-10-19 NOTE — Patient Outreach (Signed)
[place] [facility]) Care Management Telephonic RN Care Manager Note   10/19/2021 Name:  Courtney [name] MRN:  [id number] DOB:  [birth year]  Summary: Follow up outreach Updated her mailing address to her P O box address  She is still getting use to her new [employer] coverage & benefits  Oral concerns - partially addressed as had to be referred to an oral surgeon Has a right first molar tooth that aches with radiating pain to jaw Uses oral gel, tylenol with relief, completed January 2023 doses of antibiotics   11/03/21 to follow up with her pcp   Agrees to quarterly outreaches   Recommendations/Changes made from today's visit: Allowed her to ventilated her feelings about issues of previous identity thief Suggest she have her dentist send a copy of her photo taken to her oral surgeon Assessed for tooth pain and infection THN progression reviewed, offered THN disease manager  Subjective: Courtney [name] is an 66 y.o. year old female who is a primary patient of Cyndi Bender, [place]. The care management team was consulted for assistance with care management and/or care coordination needs.    Telephonic RN Care Manager completed Telephone Visit today.   Objective:  Medications Reviewed Today     Reviewed by Griffin Basil, MD (Physician) on 09/15/21 at [place] Status: <None>   Medication Order Taking? Sig Documenting Provider Last Dose Status Informant  Acetaminophen (TYLENOL) 325 MG CAPS [id number] Yes Take by mouth as needed. [provider] Taking Active Self  allopurinol (ZYLOPRIM) 100 MG tablet [id number] Yes Take 100 mg by mouth daily. [provider] Taking Active Self  amLODipine (NORVASC) 10 MG tablet [id number] Yes Take 10 mg by mouth daily. [provider] Taking Active Self           Med Note Luana Shu, MONCHELL R   Tue Apr 26, 2016  9:55 AM)    Cholecalciferol 125 MCG (5000 UT) capsule [id number] Yes Take 5,000 Units by mouth once a week.  [provider] Taking Active Self  diphenhydrAMINE (BENADRYL) 50 MG tablet [id number] Yes Take 50 mg by mouth as needed. [provider] Taking Active Self  empagliflozin (JARDIANCE) 10 MG TABS tablet [id number] Yes Take 10 mg by mouth daily. [provider] Taking Active Self  insulin regular (NOVOLIN R RELION) 100 units/mL injection [id number] Yes INJECT 30 UNITS SUB-Q before b'fas. ReliOn insulin.  AND 25 UNITS BEFORE SUPPER Griffin Basil, MD Taking Active   Insulin Syringe-Needle U-100 31G X 15/64" 0.5 ML MISC [id number] Yes Use 3x a day [name], MD Taking Active   levothyroxine (EUTHYROX) 137 MCG tablet [id number] Yes Take 1 tablet (137 mcg total) by mouth daily before breakfast. Griffin Basil, MD Taking Active   lovastatin (MEVACOR) 20 MG tablet [id number] Yes Take 20 mg by mouth daily. [provider] Taking Active Self  oxyCODONE (OXY IR/ROXICODONE) 5 MG immediate release tablet [id number] Yes Take 1 tablet (5 mg total) by mouth every 8 (eight) hours as needed for severe pain or breakthrough pain. Griffin Basil, MD Taking Active   Polyvinyl Alcohol-Povidone [facility] OP) [id number] Yes Apply to eye as needed. [provider] Taking Active Self  Semaglutide, 1 MG/DOSE, (OZEMPIC, 1 MG/DOSE,) 4 MG/3ML SOPN [id number] Yes Inject 1 mg into the skin once a week. Sunday'S Griffin Basil, MD Taking Active   torsemide (DEMADEX) 20 MG tablet [id number] Yes Take 20 mg by mouth daily. [provider] Taking Active Self  TRESIBA  FLEXTOUCH 200 UNIT/ML FlexTouch Pen [id number] Yes INJECT 70 UNITS SUBCUTANEOUSLY ONCE DAILY  Patient taking differently: 70 Units daily. INJECT 57 UNITS SUBCUTANEOUSLY ONCE DAILY   [name], MD Taking Active Self           Patient Active Problem List   Diagnosis Date Noted   Encounter for postoperative care 09/15/2021   Simple endometrial hyperplasia 09/15/2021   Other hyperlipidemia 08/26/2021    Anemia in chronic kidney disease 10/12/2020   Chronic kidney disease, stage IV (severe) ([place]) 10/12/2020   Secondary hyperparathyroidism of renal origin ([place]) 10/12/2020   Benign hypertensive kidney disease with chronic kidney disease 07/01/2020   Proteinuria 04/27/2020   Adult BMI 30+ 04/27/2020   Abscess of neck 03/09/2020   Abscess of groin 03/09/2020   Abscess of thigh 03/09/2020   Abscess of vulva 03/09/2020   Absolute anemia 03/09/2020   Benign essential HTN 03/09/2020   Cellulitis and abscess of face 03/09/2020   Chronic kidney disease, stage 3b ([place]) 03/09/2020   Combined fat and carbohydrate induced hyperlipemia 03/09/2020   Hand discomfort 03/09/2020   Renal mass 03/09/2020   Leg ulcer ([place]) 03/09/2020   Encounter for screening colonoscopy 03/09/2020   Endometrial cells on cervical Pap smear inconsistent w/LMP 06/28/2018   PMB (postmenopausal bleeding) 06/28/2018   Poorly controlled type 2 diabetes mellitus with peripheral neuropathy ([place]) 06/25/2018   Post-surgical hypothyroidism 12/26/2014   Diabetes mellitus type 2, uncontrolled 08/28/2014   Thyroid cancer ([place]) 08/28/2014     SDOH:  (Social Determinants of Health) assessments and interventions performed:   SDOH Screenings   Alcohol Screen: Not on file  Depression (PHQ2-9): Low Risk    PHQ-2 Score: 0  Financial Resource Strain: Low Risk    Difficulty of Paying Living Expenses: Not hard at all  Food Insecurity: No Food Insecurity   Worried About Charity fundraiser in the Last Year: Never true   Arboriculturist in the Last Year: Never true  Housing: Low Risk    Last Housing Risk Score: 0  Physical Activity: Not on file  Social Connections: Moderately Integrated   Frequency of Communication with Friends and Family: More than three times a week   Frequency of Social Gatherings with Friends and Family: More than three times a week   Attends Religious Services: More than 4 times per year   Active Member of [employer]  or Organizations: Yes   Attends Music therapist: More than 4 times per year   Marital Status: Never married  Stress: No Stress Concern Present   Feeling of Stress : Only a little  Tobacco Use: Medium Risk   Smoking Tobacco Use: Former   Smokeless Tobacco Use: Never   Passive Exposure: Not on file  Transportation Needs: No Transportation Needs   Lack of Transportation (Medical): No   Lack of Transportation (Non-Medical): No     Care Plan  Review of patient past medical history, allergies, medications, health status, including review of consultants reports, laboratory and other test data, was performed as part of comprehensive evaluation for care management services.   Care Plan : RN Care Manager Plan of Care  Updates made by Courtney Faster, RN since 10/19/2021 12:00 AM     Problem: Complex Care Coordination Needs and disease management in patient with DM, CKD   Priority: High  Onset Date: 09/15/2021     Long-Range Goal: Establish Plan of Care for Management Complex SDOH Barriers, disease management and Care Coordination Needs in  patient with DM, CKD   Start Date: 09/15/2021  This Visit's Progress: On track  Priority: High  Note:   Current Barriers:  Knowledge Deficits related to plan of care for management of DM II and CKD Stage 3 B  Care Coordination needs related to Financial constraints related to retirement fixed income Barriers Knowledge deficits, health behaviors, cautious related history of identity theft, sporadic eating patterns  RN CM Clinical Goal(s):  Patient will verbalize understanding of plan for management of DM II and CKD Stage 3 B as evidenced by maintained HgA1c and kidney function labs  through collaboration with RN Care manager, provider, and care team.   Interventions: Outreaches for care coordination, disease management, resources, home care/education needs Inter-disciplinary care team collaboration (see longitudinal plan of  care) Evaluation of current treatment plan related to  self management and patient's adherence to plan as established by provider    Chronic Kidney Disease Interventions:  (Status:  Goal on track:  Yes.) Long Term Goal Assessed the Patient understanding of chronic kidney disease    Re sent EMMI education on CKD after found not view from September 2022  Last practice recorded BP readings:  BP Readings from Last 3 Encounters:  09/15/21 122/68  09/01/21 (!) 142/75  08/26/21 140/72  Most recent eGFR/CrCl: No results found for: EGFR  No components found for: CRCL    Diabetes Interventions:  (Status:  Goal on track:  Yes.) Short Term Goal Assessed patient's understanding of A1c goal: <7% Provided education to patient about basic DM disease process Reviewed medications with patient and discussed importance of medication adherence Counseled on importance of regular laboratory monitoring as prescribed Discussed plans with patient for ongoing care management follow up and provided patient with direct contact information for care management team Review of patient status, including review of consultants reports, relevant laboratory and other test results, and medications completed Screening for signs and symptoms of depression related to chronic disease state  Assessed social determinant of health barriers Lab Results  Component Value Date   HGBA1C 7.0 (A) 04/26/2021   Patient Goals/Self-Care Activities: Take all medications as prescribed Attend all scheduled provider appointments Attend church or other social activities Perform all self care activities independently  Perform IADL's (shopping, preparing meals, housekeeping, managing finances) independently Call provider office for new concerns or questions  keep appointment with eye doctor check feet daily for cuts, sores or redness  Follow Up Plan:  The patient has been provided with contact information for the care management team and  has been advised to call with any health related questions or concerns.  The care management team will reach out to the patient again over the next 90+ business days.       Plan: The patient has been provided with contact information for the care management team and has been advised to call with any health related questions or concerns.  The care management team will reach out to the patient again over the next 90+ business days.  Torsha Lemus L. Lavina Hamman, RN, BSN, [place] Coordinator Office number [phone] [facility] number [phone] Fax number [phone]

## 2021-10-20 ENCOUNTER — Other Ambulatory Visit: Payer: Self-pay | Admitting: Internal Medicine

## 2021-11-03 DIAGNOSIS — I10 Essential (primary) hypertension: Secondary | ICD-10-CM | POA: Diagnosis not present

## 2021-11-03 DIAGNOSIS — E890 Postprocedural hypothyroidism: Secondary | ICD-10-CM | POA: Diagnosis not present

## 2021-11-03 DIAGNOSIS — E1122 Type 2 diabetes mellitus with diabetic chronic kidney disease: Secondary | ICD-10-CM | POA: Diagnosis not present

## 2021-11-03 DIAGNOSIS — M109 Gout, unspecified: Secondary | ICD-10-CM | POA: Diagnosis not present

## 2021-11-03 DIAGNOSIS — N8500 Endometrial hyperplasia, unspecified: Secondary | ICD-10-CM | POA: Diagnosis not present

## 2021-11-03 DIAGNOSIS — E785 Hyperlipidemia, unspecified: Secondary | ICD-10-CM | POA: Diagnosis not present

## 2021-11-03 DIAGNOSIS — Z2821 Immunization not carried out because of patient refusal: Secondary | ICD-10-CM | POA: Diagnosis not present

## 2021-11-03 DIAGNOSIS — Z6839 Body mass index (BMI) 39.0-39.9, adult: Secondary | ICD-10-CM | POA: Diagnosis not present

## 2021-11-03 DIAGNOSIS — N183 Chronic kidney disease, stage 3 unspecified: Secondary | ICD-10-CM | POA: Diagnosis not present

## 2021-11-25 DIAGNOSIS — E113513 Type 2 diabetes mellitus with proliferative diabetic retinopathy with macular edema, bilateral: Secondary | ICD-10-CM | POA: Diagnosis not present

## 2021-12-06 ENCOUNTER — Other Ambulatory Visit: Payer: Self-pay | Admitting: Internal Medicine

## 2021-12-08 ENCOUNTER — Telehealth: Payer: Self-pay | Admitting: *Deleted

## 2021-12-08 NOTE — Telephone Encounter (Signed)
Patient called and left a message this am she was notified office closing and wants to know how to get in touch with Dr. Elgie Congo to schedule appointment in July. ?I called [facility] and we discussed since she left message she spoke with someone else and understands [facility] closed not [place] and she goes to [place] for care. She has talked with registar to schedule visit. ?Staci Acosta ?

## 2021-12-14 DIAGNOSIS — N2581 Secondary hyperparathyroidism of renal origin: Secondary | ICD-10-CM | POA: Diagnosis not present

## 2021-12-14 DIAGNOSIS — I10 Essential (primary) hypertension: Secondary | ICD-10-CM | POA: Diagnosis not present

## 2021-12-14 DIAGNOSIS — R809 Proteinuria, unspecified: Secondary | ICD-10-CM | POA: Diagnosis not present

## 2021-12-14 DIAGNOSIS — E1122 Type 2 diabetes mellitus with diabetic chronic kidney disease: Secondary | ICD-10-CM | POA: Diagnosis not present

## 2021-12-14 DIAGNOSIS — N1832 Chronic kidney disease, stage 3b: Secondary | ICD-10-CM | POA: Diagnosis not present

## 2021-12-30 ENCOUNTER — Other Ambulatory Visit (INDEPENDENT_AMBULATORY_CARE_PROVIDER_SITE_OTHER): Payer: Medicare HMO | Admitting: Internal Medicine

## 2021-12-30 ENCOUNTER — Encounter: Payer: Self-pay | Admitting: Internal Medicine

## 2021-12-30 ENCOUNTER — Ambulatory Visit: Payer: Medicare HMO | Admitting: Internal Medicine

## 2021-12-30 VITALS — BP 140/78 | HR 87 | Ht 67.0 in | Wt 242.4 lb

## 2021-12-30 DIAGNOSIS — E1142 Type 2 diabetes mellitus with diabetic polyneuropathy: Secondary | ICD-10-CM | POA: Diagnosis not present

## 2021-12-30 DIAGNOSIS — E7849 Other hyperlipidemia: Secondary | ICD-10-CM

## 2021-12-30 DIAGNOSIS — E1165 Type 2 diabetes mellitus with hyperglycemia: Secondary | ICD-10-CM | POA: Diagnosis not present

## 2021-12-30 DIAGNOSIS — E890 Postprocedural hypothyroidism: Secondary | ICD-10-CM

## 2021-12-30 DIAGNOSIS — C73 Malignant neoplasm of thyroid gland: Secondary | ICD-10-CM

## 2021-12-30 LAB — T4, FREE: Free T4: 1.8 ng/dL — ABNORMAL HIGH (ref 0.60–1.60)

## 2021-12-30 LAB — TSH: TSH: 0.16 u[IU]/mL — ABNORMAL LOW (ref 0.35–5.50)

## 2021-12-30 LAB — POCT GLYCOSYLATED HEMOGLOBIN (HGB A1C): Hemoglobin A1C: 7.8 % — AB (ref 4.0–5.6)

## 2021-12-30 NOTE — Progress Notes (Addendum)
Patient ID: Courtney [name], female   DOB: 08-26-1956, 66 y.o.   MRN: [id number] ? ?This visit occurred during the SARS-CoV-2 public health emergency.  Safety protocols were in place, including screening questions prior to the visit, additional usage of staff PPE, and extensive cleaning of exam room while observing appropriate contact time as indicated for disinfecting solutions.  ? ?HPI: ?Courtney [name] is a 66 y.o.-year-old female, presenting for follow-up for DM2, dx in ~2000, insulin-dependent since 2011, uncontrolled, with complications (CKD - sees nephrology, DR) also postsurgical hypothyroidism after sx for Thyroid cancer. Last visit 4 months ago. ? ?Interim history: ?She has increased urination after she started [place] -now on Jardiance; no blurry vision, nausea, chest pain.  ?She has a h/o irregular menses. She developed postmenopausal bleeding in 2019 >> she had D&C on 09/01/2021.  ?She is not having regular meals. Also, she is drinking sweet tea. ? ?DM2: ?Reviewed HbA1c levels: ?Lab Results  ?Component Value Date  ? HGBA1C 7.8 (A) 08/26/2021  ? HGBA1C 7.0 (A) 04/26/2021  ? HGBA1C 6.9 (A) 12/23/2020  ? HGBA1C 6.8 (A) 08/19/2020  ? HGBA1C 7.8 (A) 05/19/2020  ? HGBA1C 10.7 (A) 02/13/2020  ? HGBA1C 12.6 (A) 11/13/2019  ? HGBA1C 9.8 (A) 05/13/2019  ? HGBA1C 13.7 (A) 10/23/2018  ? HGBA1C 10.1 (A) 06/25/2018  ? HGBA1C 8.6 (A) 02/14/2018  ? HGBA1C 8.3 11/13/2017  ? HGBA1C 9.1 08/16/2017  ? HGBA1C 11.3 05/31/2017  ? HGBA1C 11.9 02/24/2017  ? HGBA1C 9.5 11/25/2016  ? HGBA1C 8.2 (H) 04/13/2016  ? HGBA1C 9.1 02/05/2016  ? HGBA1C 9.8 (H) 09/18/2015  ? HGBA1C 9.9 06/19/2015  ? HGBA1C 11.0 03/27/2015  ? HGBA1C 10.9 (H) 12/26/2014  ?08/22/2016: HbA1c 11.8% ?08/01/2014: HbA1c 14.5% ?05/02/2014: HbA1c 14.8% ?01/31/2014: HbA1c 13.6% ?11/01/2013: HbA1c 15.1% ? ?She is on: ?- Tresiba 70 units in a.m. ?- Novolin Regular insulin 30 units before breakfast  30 >> 25 units before dinner  ?- Ozempic 0.5 >> 1 mg weekly in a.m.  ?-   Jardiance 10 mg daily - started by Dr. Juleen China 01/2021 ?Stopped Metformin 500 mg 2x a day - b/c leg swelling. ?She was on Novolin 70/30 48 units 2x a day 15 min before the meals  ?She was on Amaryl 8 mg in am ?She was on Metformin >> stopped 2/2 CKD. ? ?She checks sugars twice a day: ?- am:  93-159, 169 >>  94-135, 169 (forgot meds)>> 93-147, 167, 175 ?- 2h after b'fast: n/c ?- before lunch: 114, 192, 229 >> n/c >> 135 >> 120-140 >> 101, 106 >> n/c ?- 2h after lunch: n/c >> 67, 228 >> n/c ?- before dinner: 120-130 >> 75-118 >> 65, 95-134, 165 >> 258  ?- 2h after dinner: n/c >> 131, 269 >> n/c ?- bedtime:  240s >> n/c >> 287 >> n/c > 108, 168 >> n/c >> 131 >> n/c ?- nighttime: n/c ? Lowest sugar was 63 >> 78 >> 75 >> 65 >> 93; it is unclear at which level she has hypoglycemia ?Highest sugar was 325 >> .Marland Kitchen.169 >> 169 >> 253. ? ?Glucometer: Prodigy >> ReliOn ? ?Pt's meals are: ?- Breakfast: chicken  ?- Lunch: peanuts, sandwich + crystal lite drink ?- Dinner: frozen dinner ?- Snacks: fruit, small bag potato chips, peanuts ?She was drinking sweet tea and sodas in the past and I strongly advised her to stop. ? ?-+ Stage III CKD: ?Lab Results  ?Component Value Date  ? BUN 15 09/01/2021  ? ?Lab Results  ?Component  Value Date  ? CREATININE 1.33 (H) 09/01/2021  ?04/20/2021: 18/1.65 ?04/27/2020: CMP normal with the exception of glucose 136, BUN/creatinine 22/2.18, GFR 27, calcium 8.6 (8.7-10.3), alkaline phosphatase 145 (48-121), ALT 33 (0-32), Vitamin D 23.5. In PCPs note, it was mentioned that she was lost for follow-up with nephrology and she was referred back to [facility]. ?10/28/2019: Glucose 96, BUN/creatinine 12/1.47, GFR 43 ?08/22/2016: BUN/creatinine 20/1.41 ?03/18/2016: BUN/creatinine 16/1.21, EGFR 57, Calcium 8.7, AST/ALT 17/24 ?09/18/2015: BUN/creatinine 14/1.2, GFR 57 (previously 55) ?08/01/2014: 15/1.26, GFR 55 ?On Cozaar. ? ?-+ HL; lipids: ?Lab Results  ?Component Value Date  ? CHOL 143  08/26/2021  ? HDL 77.60 08/26/2021  ? [place] 51 08/26/2021  ? TRIG 70.0 08/26/2021  ? CHOLHDL 2 08/26/2021  ?04/27/2020: 130/121/69/40 ?10/28/2019: 166/88/91/6007/03/2016: Lipids: 156/85/70/69 ?09/18/2015: 160/57/87/62 ?08/01/2014: 145/78/74/55 ?On lovastatin 20. ? 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  She had left eye cataract surgery. ? ?-She has numbness and tingling in her right foot ? ?Follicular variant of papillary ThyCA - in remission: ? 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  ?- She did not have RAI treatment at that time, however, she was found to have a thyroid mass in 2005 >> This was biopsied and returned as normal thyroid tissue, but had RAI treatment at that time.  ?- The posttreatment whole-body scan was negative for any cancer spread.  ? ?Thyroglobulin returned at 2.3 in 08/2014 >> I ordered a neck ultrasound that showed a stable nodule with a fatty hilum, consistent with a benign lymph node, but no other masses in the surgical bed.  ? ?The thyroglobulin level remained detectable, and was actually 9.9 in 10/2015.  ? ?A neck ultrasound (11/2015) showed a stable 1.4 nodule in the thyroid bed, consistent with a benign process ?  ?Her thyroglobulin levels continue to be detectable ? ?A neck ultrasound (02/2020) was negative for metastasis or recurrences ? ?A whole-body scan (03/2020) was also negative for abnormal uptake ? ?Reviewed her thyroglobulin and ATA antibodies: ?Lab Results  ?Component Value Date  ? THYROGLB 2.3 (L) 04/26/2021  ? THYROGLB 2.0 (L) 08/19/2020  ? THYROGLB 3.3 02/13/2020  ? THYROGLB 2.1 (L) 05/13/2019  ? THYROGLB 2.5 (L) 06/25/2018  ? THYROGLB 1.8 (L) 08/15/2017  ? THYROGLB 2.5 (L) 08/29/2016  ? THYROGLB 9.9 11/06/2015  ? THYROGLB 2.4 (L) 09/18/2015  ?  THYROGLB 0.8 (L) 12/26/2014  ? THGAB <1 04/26/2021  ? THGAB <1 08/19/2020  ? THGAB <1 02/13/2020  ? THGAB <1 05/13/2019  ? THGAB <1 06/25/2018  ? THGAB <1 08/15/2017  ? THGAB <1 08/29/2016  ? THGAB <1 09/18/2015  ? THGAB <1.0 12/26/2014  ? THGAB <1 08/22/2014  ? THGAB <1.0 08/22/2014  ? ?Postsurgical hypothyroidism: ? ?Reviewed her TFTs: ?Lab Results  ?Component Value Date  ? TSH 0.20 (L) 08/26/2021  ? TSH 0.27 (L) 04/26/2021  ? TSH 1.50 10/07/2020  ? TSH 0.10 (L) 08/19/2020  ? TSH 0.91 02/13/2020  ? FREET4 1.30 08/26/2021  ? FREET4 1.39 04/26/2021  ? FREET4 1.18 10/07/2020  ? FREET4 1.47 08/19/2020  ? FREET4 1.37 02/13/2020  ?10/28/2019: TSH 1.6 ?08/01/2014: TSH 1.89 ? ?Pt is on levothyroxine 137 mcg daily (decreased from 150 mcg 08/2020), taken: ?- in am ?- fasting ?- at least 30 min  from b'fast ?- no calcium ?- no iron ?- no multivitamins ?- no PPIs ?- not on Biotin ?On vitamin D - 50,000 units once a week. ? ?No FH of thyroid cancer. No h/o radiation tx to head or neck other than RAI treatment. ?No herbal supplements. No Biotin use. No recent steroids use.  ? ?Pt denies: ?- feeling nodules in neck ?- hoarseness ?- dysphagia ?- choking ?- SOB with lying down ? ?ROS: ?+ see HPI ?Neurological: no tremors/+ numbness/+ tingling/no dizziness ? ?I reviewed pt's medications, allergies, PMH, social hx, family hx, and changes were documented in the history of present illness. Otherwise, unchanged from my initial visit note. ? ?Past Medical History:  ?Diagnosis Date  ? Anemia associated with chronic renal failure   ? Arthritis   ? Chronic kidney disease (CKD), stage III (moderate) (HCC)   ? nephrologist-- dr s. Juleen China  ? Diabetic retinopathy of both eyes ([place])   ? followed by dr c. haddad in [place];  pt stated gets eye injections every 6 months  ? History of COVID-19 2020  ? per pt mild to moderate symptoms , no hospital admission,  symptoms resolved with exceptin taste/ smell still not right  ? History of thyroid  cancer 09/2002  ? endocrinologist--- dr Cruzita Lederer;  s/p total thyroidectomy [phone] for multinodular goiter, dx follicular varient papillary thyroid cancer, no chemo/ radiation;   RAI for normal bx of mass i

## 2021-12-30 NOTE — Patient Instructions (Addendum)
STOP TEA!!! ? ?Please continue: ?- Jardiance 10 mg before b'fast ?- Tresiba 70 units in am ?- Regular insulin 30 units before breakfast and 25 units before dinner ?- Ozempic 1 mg weekly in a.m.  ? ?Please continue levothyroxine 137 mcg daily. ? ?Take the thyroid hormone every day, with water, at least 30 minutes before breakfast, separated by at least 4 hours from: ?- acid reflux medications ?- calcium ?- iron ?- multivitamins ? ?Please stop at the lab. ? ?Please return in 4 months with your sugar log.  ?

## 2021-12-31 LAB — THYROGLOBULIN LEVEL: Thyroglobulin: 2.9 ng/mL

## 2021-12-31 LAB — THYROGLOBULIN ANTIBODY: Thyroglobulin Ab: <1 IU/mL (ref <=1)

## 2021-12-31 MED ORDER — LEVOTHYROXINE SODIUM 125 MCG PO TABS: 125.0000 ug | ORAL_TABLET | Freq: Every day | ORAL | 3 refills | Status: DC

## 2022-01-04 NOTE — Addendum Note (Signed)
Addended by: [name] on: 01/04/2022 12:46 PM ? ? Modules accepted: Orders ? ?

## 2022-01-05 ENCOUNTER — Telehealth: Payer: Self-pay

## 2022-01-05 NOTE — Telephone Encounter (Signed)
Voicemail from [place] advising dx code provided for CT scan will only allow CT with contrast study not without contrast. Requesting order be changed so pt can be scheduled. ?

## 2022-01-06 NOTE — Telephone Encounter (Signed)
In that case, we can just do it with contrast ?

## 2022-01-11 ENCOUNTER — Other Ambulatory Visit: Payer: Self-pay | Admitting: Internal Medicine

## 2022-01-17 ENCOUNTER — Telehealth: Payer: Self-pay | Admitting: Pharmacy Technician

## 2022-01-17 ENCOUNTER — Other Ambulatory Visit (HOSPITAL_COMMUNITY): Payer: Self-pay

## 2022-01-17 NOTE — Telephone Encounter (Signed)
Patient Advocate Encounter ? ?Received notification from [facility], that pt needed a PA for  Insulin Degludec 200u/ml ? ?Per Test Claim: Courtney [name] 200u/ml was filled today at [facility]. Pt's ins. Must not pay for the generic. No PA needed at this time.  ? ?

## 2022-01-18 ENCOUNTER — Other Ambulatory Visit: Payer: Self-pay | Admitting: *Deleted

## 2022-01-18 NOTE — Patient Outreach (Signed)
[facility]) Care Management ?Telephonic RN Care Manager Note ? ? ?01/18/2022 ?Name:  Courtney [name] MRN:  [id number] DOB:  [birth year] ? ?Summary: ?Follow up outreach to patient ?She reports some fatigue after being outside a lot ?Today she reports she had been sleeping prior to the outreach call ?Courtney [name] (dentist in [place]) was confirmed to have been seen since the last outreach  ? ? ?Care coordination concerns discussed today ?Courtney [name] messaged her about an imaging denial and she is not sure which imaging ? ? ? ?Her Nephrologist, Courtney [name] on 12/14/21 recommended she see a podiatrist, Courtney [name] at [facility] [address] [place]  after she shared that she has a small callus on the bottom of her left foot ? ? ?Diabetes continues to be managed - She saw Courtney [name] on 12/30/21 ? ?Recommendations/Changes made from today's visit: ?Assessed for care coordination and disease management needs  ?Discussed 01/04/22 order for a CT of the soft tissue of her neck without contrast that is the possible imaging that [employer] messaged her about  ?Also discussed viewed EPIC notes indicating Courtney [name] was notified and is re adjusting the order to attempt to order as needed for insurance requirements ?Conference outreach with pt to Courtney [name] office [phone]  to inquire if her Courtney [name] (HMO) was covered as the [employer] site indicated Courtney [name] is not an In network provider ?Pt was assisted to get an appointment at 1415 on Feb 03 2022 in [place] at [facility] with Courtney [name] related to her small callus on the bottom of her left foot ?Diabetic education continued and pt again reminded to stop the intake of sweet tea as recommended by her endocrinologist also  ? ?Subjective: ?Courtney [name] is an 66 y.o. year old female who is a primary patient of Courtney [name]. The care management team was consulted for assistance with care management  and/or care coordination needs.   ? ?Telephonic RN Care Manager completed Telephone Visit today.  ? ?Objective: ? ?Medications Reviewed Today   ? ? Reviewed by Courtney Kingdom, MD (Physician) on 12/30/21 at 1000  Med List Status: <None>  ? ?Medication Order Taking? Sig Documenting Provider Last Dose Status Informant  ?Acetaminophen (TYLENOL) 325 MG CAPS [id number] No Take by mouth as needed. [provider] Taking Active Self  ?allopurinol (ZYLOPRIM) 100 MG tablet [id number] No Take 100 mg by mouth daily. [provider] Taking Active Self  ?amLODipine (NORVASC) 10 MG tablet [id number] No Take 10 mg by mouth daily. [provider] Taking Active Self  ?         ?Med Note Burney Gauze R   Tue Apr 26, 2016  9:55 AM)    ?Cholecalciferol 125 MCG (5000 UT) capsule [id number] No Take 5,000 Units by mouth once a week. [provider] Taking Active Self  ?diphenhydrAMINE (BENADRYL) 50 MG tablet [id number] No Take 50 mg by mouth as needed. [provider] Taking Active Self  ?empagliflozin (JARDIANCE) 10 MG TABS tablet [id number] No Take 10 mg by mouth daily. [provider] Taking Active Self  ?insulin regular (NOVOLIN R RELION) 100 units/mL injection [id number] No INJECT 30 UNITS SUB-Q before b'fas. ReliOn insulin.  AND 25 UNITS BEFORE SUPPER Courtney Basil, MD Taking Active   ?Insulin Syringe-Needle U-100 31G X 15/64" 0.5 ML MISC [id number] No Use 3x a day Courtney Kingdom, MD Taking Active   ?  levothyroxine (EUTHYROX) 137 MCG tablet [id number] No Take 1 tablet (137 mcg total) by mouth daily before breakfast. Courtney Basil, MD Taking Active   ?levothyroxine (SYNTHROID, LEVOTHROID) 100 MCG/5ML SOLN injection [id number]   [provider]  Active   ?lovastatin (MEVACOR) 20 MG tablet [id number] No Take 20 mg by mouth daily. [provider] Taking Active Self  ?medroxyPROGESTERone (PROVERA) 10 MG tablet [id number] No Take 1 tablet (10 mg total) by mouth  daily. Use for ten days Courtney Basil, MD Taking Active   ?misoprostol (CYTOTEC) 100 MCG tablet [id number]  1/2 tab per vagina the night before procedure Courtney Basil, MD  Active   ?oxyCODONE (OXY IR/ROXICODONE) 5 MG immediate release tablet [id number] No Take 1 tablet (5 mg total) by mouth every 8 (eight) hours as needed for severe pain or breakthrough pain. Courtney Basil, MD Taking Active   ?OZEMPIC, 1 MG/DOSE, 4 MG/3ML SOPN [id number]  INJECT '1MG'$  SUBCUTANEOUSLY ONCE WEEKLY Courtney Kingdom, MD  Active   ?Polyvinyl Alcohol-Povidone (REFRESH OP) [id number] No Apply to eye as needed. [provider] Taking Active Self  ?torsemide (DEMADEX) 20 MG tablet [id number] No Take 20 mg by mouth daily. [provider] Taking Active Self  ?TRESIBA FLEXTOUCH 200 UNIT/ML FlexTouch Pen [id number]  INJECT 12 UNITS SUBCUTANEOUSLY ONCE DAILY Courtney Kingdom, MD  Active   ? ?  ?  ? ?  ? ? ? ?SDOH:  (Social Determinants of Health) assessments and interventions performed:  ? ? ?Care [name] ? ?Review of patient past medical history, allergies, medications, health status, including review of consultants reports, laboratory and other test data, was performed as part of comprehensive evaluation for care management services.  ? ?There are no care plans that you recently modified to display for this patient. ?  ? ?[name]: The patient has been provided with contact information for the care management team and has been advised to call with any health related questions or concerns.  ?The care management team will reach out to the patient again over the next 30 + business  days. ? ?Courtney Pherigo L. Lavina Hamman, RN, BSN, CCM ?[place] Management Care Coordinator ?Office number [phone] ?Main [facility] number [phone] ?Fax number [phone] ? ? ? ? ? ?

## 2022-02-03 ENCOUNTER — Telehealth: Payer: Self-pay

## 2022-02-03 DIAGNOSIS — E1142 Type 2 diabetes mellitus with diabetic polyneuropathy: Secondary | ICD-10-CM | POA: Diagnosis not present

## 2022-02-03 DIAGNOSIS — M79675 Pain in left toe(s): Secondary | ICD-10-CM | POA: Diagnosis not present

## 2022-02-03 DIAGNOSIS — M79674 Pain in right toe(s): Secondary | ICD-10-CM | POA: Diagnosis not present

## 2022-02-03 DIAGNOSIS — L853 Xerosis cutis: Secondary | ICD-10-CM | POA: Diagnosis not present

## 2022-02-03 DIAGNOSIS — B351 Tinea unguium: Secondary | ICD-10-CM | POA: Diagnosis not present

## 2022-02-03 NOTE — Telephone Encounter (Signed)
Pt contacted the office regarding a test that was ordered and denied by insurance. Pt called to follow up regarding PA.

## 2022-02-04 DIAGNOSIS — E113513 Type 2 diabetes mellitus with proliferative diabetic retinopathy with macular edema, bilateral: Secondary | ICD-10-CM | POA: Diagnosis not present

## 2022-02-24 ENCOUNTER — Telehealth: Payer: Self-pay

## 2022-02-24 NOTE — Telephone Encounter (Signed)
Voicemail left on nurse line from patient stating she thinks she has a yeast infection. I called patient back to see what symptoms she is having. Per patient she has been having some vaginal spotting and some irregular vaginal discharge. Patient has an endometrial biopsy scheduled for next month with Dr. Elgie Congo. Appointment scheduled for patient for a Nurse visit for self swab on 02/28/22 at 2 PM for the irregular discharge. I reviewed this with patient. Patient verbalized understanding and denies any other questions.   Paulina Fusi, RN 02/24/22

## 2022-02-25 ENCOUNTER — Other Ambulatory Visit: Payer: Self-pay | Admitting: *Deleted
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
## 2022-02-28 ENCOUNTER — Other Ambulatory Visit (HOSPITAL_COMMUNITY)
Admission: RE | Admit: 2022-02-28 | Discharge: 2022-02-28 | Disposition: A | Discharge status: Home / Self Care | Payer: Medicare HMO | Source: Ambulatory Visit | Attending: Family Medicine | Admitting: Family Medicine

## 2022-02-28 ENCOUNTER — Ambulatory Visit: Payer: Medicare HMO | Admitting: *Deleted

## 2022-02-28 VITALS — BP 119/74 | HR 86 | Ht 67.5 in | Wt 238.6 lb

## 2022-02-28 DIAGNOSIS — N898 Other specified noninflammatory disorders of vagina: Secondary | ICD-10-CM | POA: Insufficient documentation

## 2022-02-28 NOTE — Progress Notes (Signed)
Here for nurse visit for self swab. States had surgery in December ( hysterocopy, d&C, myosure tissue resection. ). States had blackish disharge on her underwear , then when she wiped was also black. Then later that same day saw dark black with fresh blood small amount vaginal discharge when she wiped. This happened on 02/21/22 . Then from Monday until Saturday continued sometimes when went to bathroom saw blackish discharge. Stopped Saturday or Sunday am. We discussed this could be some postmenopausal bleeding again. She has follow up GYn appointment with Dr. Elgie Congo scheduled for 03/28/22. I sent note to him re: today's visit and discharge. She called and spoke with a nurse who advised nurse visit for self swab. Will do self swab to rule out any infections.  Declines STD testing as is not sexually active " for a long time" [facility]

## 2022-03-01 LAB — CERVICOVAGINAL ANCILLARY ONLY
Bacterial Vaginitis (gardnerella): NEGATIVE
Candida Glabrata: POSITIVE — AB
Candida Vaginitis: NEGATIVE
Comment: NEGATIVE
Comment: NEGATIVE
Comment: NEGATIVE

## 2022-03-02 MED ORDER — FLUCONAZOLE 150 MG PO TABS: 150.0000 mg | ORAL_TABLET | ORAL | 0 refills | Status: DC

## 2022-03-02 NOTE — Addendum Note (Signed)
Addended by: Donnamae Jude on: 03/02/2022 09:37 AM   Modules accepted: Orders

## 2022-03-02 NOTE — Progress Notes (Signed)
Wet prep c/w yeast--Candida glabrata found--prolonged diflucan for clearance.

## 2022-03-04 ENCOUNTER — Telehealth: Payer: Self-pay

## 2022-03-04 MED ORDER — TERCONAZOLE 0.4 % VA CREA: 1.0000 | TOPICAL_CREAM | Freq: Every day | VAGINAL | 0 refills | Status: AC

## 2022-03-04 NOTE — Telephone Encounter (Signed)
Call placed back to pt. Spoke with pt. Pt given recommendations per Dr Shawnie Pons. Pt agreeable to Rx Terconazole. Rx sent to patients pharmacy.  Courtney [name]

## 2022-03-16 ENCOUNTER — Other Ambulatory Visit: Payer: Self-pay | Admitting: Pharmacist

## 2022-03-16 DIAGNOSIS — N898 Other specified noninflammatory disorders of vagina: Secondary | ICD-10-CM

## 2022-03-16 MED ORDER — FLUCONAZOLE 150 MG PO TABS: 150.0000 mg | ORAL_TABLET | ORAL | 0 refills | Status: AC

## 2022-03-16 NOTE — Progress Notes (Signed)
Patient's pharmacy called to inquire about drug interaction between fluconazole and lovastatin. Okay to continue both medications and have patient to monitor for any increased adverse reactions such as muscle cramps, etc. Spoke with Dr. Nehemiah Settle who okay'd changing patient's prescription from every other day to q72hours for 4 doses to treat Candida glabrata.

## 2022-03-18 DIAGNOSIS — E113513 Type 2 diabetes mellitus with proliferative diabetic retinopathy with macular edema, bilateral: Secondary | ICD-10-CM | POA: Diagnosis not present

## 2022-03-28 ENCOUNTER — Ambulatory Visit (INDEPENDENT_AMBULATORY_CARE_PROVIDER_SITE_OTHER): Payer: Medicare HMO | Admitting: Obstetrics and Gynecology

## 2022-03-28 ENCOUNTER — Other Ambulatory Visit (HOSPITAL_COMMUNITY)
Admission: RE | Admit: 2022-03-28 | Discharge: 2022-03-28 | Disposition: A | Discharge status: Home / Self Care | Payer: Medicare HMO | Source: Ambulatory Visit | Attending: Obstetrics and Gynecology | Admitting: Obstetrics and Gynecology

## 2022-03-28 VITALS — BP 128/77 | HR 84 | Wt 237.1 lb

## 2022-03-28 DIAGNOSIS — N8501 Benign endometrial hyperplasia: Secondary | ICD-10-CM | POA: Insufficient documentation

## 2022-03-28 NOTE — Progress Notes (Signed)
ENDOMETRIAL BIOPSY      Courtney [name] is a 66 y.o. G0P0000 here for endometrial biopsy.  The indications for endometrial biopsy were reviewed.  Risks of the biopsy including cramping, bleeding, infection, uterine perforation, inadequate specimen and need for additional procedures were discussed. The patient states she understands and agrees to undergo procedure today.  She denies any recurrence of postmenopausal bleeding. Consent was signed. Time out was performed.   Indications: small foci of simple endometrial hyperplasia Urine HCG: pt is postmenopausal  A bivalve speculum was placed into the vagina and the cervix was easily visualized and was prepped with Betadine x2. A single-toothed tenaculum was placed on the anterior lip of the cervix to stabilize it. The 3 mm pipelle was introduced into the endometrial cavity without difficulty to a depth of 9 cm, and a moderate amount of tissue was obtained and sent to pathology. This was repeated for a total of 2 passes. The instruments were removed from the patient's vagina. Minimal bleeding from the cervix at the tenaculum was noted.   The patient tolerated the procedure well. Routine post-procedure instructions were given to the patient.    Will base further management on results of biopsy. Repeat biopsy in 6 months.  Continue daily provera  Lynnda Shields, MD

## 2022-03-31 LAB — SURGICAL PATHOLOGY

## 2022-05-03 DIAGNOSIS — R809 Proteinuria, unspecified: Secondary | ICD-10-CM | POA: Diagnosis not present

## 2022-05-03 DIAGNOSIS — N1832 Chronic kidney disease, stage 3b: Secondary | ICD-10-CM | POA: Diagnosis not present

## 2022-05-03 DIAGNOSIS — N2581 Secondary hyperparathyroidism of renal origin: Secondary | ICD-10-CM | POA: Diagnosis not present

## 2022-05-03 DIAGNOSIS — E1122 Type 2 diabetes mellitus with diabetic chronic kidney disease: Secondary | ICD-10-CM | POA: Diagnosis not present

## 2022-05-03 DIAGNOSIS — I10 Essential (primary) hypertension: Secondary | ICD-10-CM | POA: Diagnosis not present

## 2022-05-04 ENCOUNTER — Encounter: Payer: Self-pay | Admitting: Internal Medicine

## 2022-05-04 ENCOUNTER — Ambulatory Visit (INDEPENDENT_AMBULATORY_CARE_PROVIDER_SITE_OTHER): Payer: Medicare HMO | Admitting: Internal Medicine

## 2022-05-04 VITALS — BP 118/78 | HR 86 | Ht 67.5 in | Wt 238.0 lb

## 2022-05-04 DIAGNOSIS — E890 Postprocedural hypothyroidism: Secondary | ICD-10-CM

## 2022-05-04 DIAGNOSIS — E7849 Other hyperlipidemia: Secondary | ICD-10-CM | POA: Diagnosis not present

## 2022-05-04 DIAGNOSIS — E1142 Type 2 diabetes mellitus with diabetic polyneuropathy: Secondary | ICD-10-CM

## 2022-05-04 DIAGNOSIS — E1165 Type 2 diabetes mellitus with hyperglycemia: Secondary | ICD-10-CM | POA: Diagnosis not present

## 2022-05-04 DIAGNOSIS — C73 Malignant neoplasm of thyroid gland: Secondary | ICD-10-CM

## 2022-05-04 LAB — POCT GLYCOSYLATED HEMOGLOBIN (HGB A1C): Hemoglobin A1C: 7.1 % — AB (ref 4.0–5.6)

## 2022-05-04 LAB — TSH: TSH: 0.43 u[IU]/mL (ref 0.35–5.50)

## 2022-05-04 LAB — T4, FREE: Free T4: 1.7 ng/dL — ABNORMAL HIGH (ref 0.60–1.60)

## 2022-05-04 MED ORDER — FREESTYLE LIBRE 2 SENSOR MISC: 1.0000 | 3 refills | Status: DC

## 2022-05-04 MED ORDER — INSULIN PEN NEEDLE 32G X 4 MM MISC: 3 refills | Status: DC

## 2022-05-04 MED ORDER — NOVOLIN R FLEXPEN RELION 100 UNIT/ML IJ SOPN: PEN_INJECTOR | INTRAMUSCULAR | 3 refills | Status: DC

## 2022-05-04 MED ORDER — OZEMPIC (1 MG/DOSE) 4 MG/3ML ~~LOC~~ SOPN: PEN_INJECTOR | SUBCUTANEOUS | 3 refills | Status: DC

## 2022-05-04 MED ORDER — TRESIBA FLEXTOUCH 200 UNIT/ML ~~LOC~~ SOPN: PEN_INJECTOR | SUBCUTANEOUS | 3 refills | Status: DC

## 2022-05-04 MED ORDER — FREESTYLE LIBRE 2 READER DEVI: 1.0000 | Freq: Every day | 0 refills | Status: AC

## 2022-05-04 NOTE — Patient Instructions (Addendum)
Please continue: - Jardiance 10 mg before b'fast - Tresiba 70 units in am - Regular insulin 30 units before breakfast and 25 units before dinner - Ozempic 1 mg weekly in a.m.   Please continue levothyroxine 127 mcg daily.  Take the thyroid hormone every day, with water, at least 30 minutes before breakfast, separated by at least 4 hours from: - acid reflux medications - calcium - iron - multivitamins  Please stop at the lab.  Please return in 4 months with your sugar log.

## 2022-05-04 NOTE — Progress Notes (Signed)
Patient ID: Courtney [name], female   DOB: [birth year], 66 y.o.   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 Last visit 4 months ago.  Interim history: She has increased urination after she started [place] -now on Jardiance; no blurry vision, nausea, chest pain.  She is not having regular meals.  At last visit he was drinking sweet tea and I strongly advised her to stop. She did not stop or reduce the amount... She saw her nephrologist yesterday and she will have nutrition classes.  DM2: Reviewed HbA1c levels: Lab Results  Component Value Date   HGBA1C 7.8 (A) 08/26/2021   HGBA1C 7.0 (A) 04/26/2021   HGBA1C 6.9 (A) 12/23/2020   HGBA1C 6.8 (A) 08/19/2020   HGBA1C 7.8 (A) 05/19/2020   HGBA1C 10.7 (A) 02/13/2020   HGBA1C 12.6 (A) 11/13/2019   HGBA1C 9.8 (A) 05/13/2019   HGBA1C 13.7 (A) 10/23/2018   HGBA1C 10.1 (A) 06/25/2018   HGBA1C 8.6 (A) 02/14/2018   HGBA1C 8.3 11/13/2017   HGBA1C 9.1 08/16/2017   HGBA1C 11.3 05/31/2017   HGBA1C 11.9 02/24/2017   HGBA1C 9.5 11/25/2016   HGBA1C 8.2 (H) 04/13/2016   HGBA1C 9.1 02/05/2016   HGBA1C 9.8 (H) 09/18/2015   HGBA1C 9.9 06/19/2015   HGBA1C 11.0 03/27/2015   HGBA1C 10.9 (H) 12/26/2014  08/22/2016: HbA1c 11.8% 08/01/2014: HbA1c 14.5% 05/02/2014: HbA1c 14.8% 01/31/2014: HbA1c 13.6% 11/01/2013: HbA1c 15.1%  She is on: - Tresiba 70 units in a.m. - Novolin Regular insulin 30 units before breakfast  30 >> 25 units before dinner  - Ozempic 0.5 >> 1 mg weekly in a.m.  -  Jardiance 10 mg daily - started by Dr. Juleen [name] 01/2021 Stopped Metformin 500 mg 2x a day - b/c leg swelling. She was on Novolin 70/30 48 units 2x a day 15 min before the meals  She was on Amaryl 8 mg in am She was on Metformin >> stopped 2/2  CKD.  She checks sugars twice a day: - am:  93-159, 169 >> 94-135, 169 >> 93-147, 167, 175 >> 78-139, 155 - 2h after b'fast: n/c - before lunch: 135 >> 120-140 >> 101, 106 >> n/c - 2h after lunch: n/c >> 67, 228 >> n/c - before dinner: 75-118 >> 65, 95-134, 165 >> 258  >> 81-139 - 2h after dinner: n/c >> 131, 269 >> n/c - bedtime:  287 >> n/c > 108, 168 >> n/c >> 131 >> n/c - nighttime: n/c  Lowest sugar was 63 >> 78 >> 75 >> 65 >> 93; it is unclear at which level she has hypoglycemia Highest sugar was 325 >> .Marland Kitchen.169 >> 169 >> 253.  Glucometer: Prodigy >> ReliOn  Pt's meals are: - Breakfast: chicken  - Lunch: peanuts, sandwich + crystal lite drink - Dinner: frozen dinner - Snacks: fruit, small bag potato chips, peanuts She was drinking sweet tea and sodas in the past and I strongly advised her to stop.  -+ Stage III CKD: 12/14/2021 -reviewed nephrology labs: Glucose 65 - 139 mg/dL 73   BUN 7 - 25 mg/dL 14   Creatinine 0.50 - 1.05 mg/dL 1.79 High    eGFR CKD-EPI CR 2021 > OR = 60 mL/min/1.27m 31 Low    BUN/Creatinine Ratio 6 - 22 (calc) 8   Sodium 135 - 146 mmol/L 144  Potassium 3.5 - 5.3 mmol/L 4.0   Chloride 98 - 110 mmol/L 108   Bicarbonate (CO2) 20 - 32 mmol/L 27   Calcium 8.6 - 10.4 mg/dL 9.3   Phosphorus 2.1 - 4.3 mg/dL 3.8   Albumin 3.6 - 5.1 g/dL 3.8    Creatinine, Ur 20 - 275 mg/dL 178   Urine Protein/Creatinine Ratio 24 - 184 mg/g creat 534 High    Protein/Creatinine Ratio, Urine 0.024 - 0.184 mg/mg creat 0.534 High    Protein Urine Random 5 - 24 mg/dL 95 High     Lab Results  Component Value Date   BUN 15 09/01/2021   Lab Results  Component Value Date   CREATININE 1.33 (H) 09/01/2021  04/20/2021: 18/1.65 04/27/2020: CMP normal with the exception of glucose 136, BUN/creatinine 22/2.18, GFR 27, calcium 8.6 (8.7-10.3), alkaline phosphatase 145 (48-121), ALT 33 (0-32), Vitamin D 23.5. In PCPs note, it was mentioned that she was lost for follow-up with nephrology  and she was referred back to [facility]. 10/28/2019: Glucose 96, BUN/creatinine 12/1.47, GFR 43 08/22/2016: BUN/creatinine 20/1.41 03/18/2016: BUN/creatinine 16/1.21, EGFR 57, Calcium 8.7, AST/ALT 17/24 09/18/2015: BUN/creatinine 14/1.2, GFR 57 (previously 55) 08/01/2014: 15/1.26, GFR 55 On Cozaar.  -+ HL; lipids: Lab Results  Component Value Date   CHOL 143 08/26/2021   HDL 77.60 08/26/2021   LDLCALC 51 08/26/2021   TRIG 70.0 08/26/2021   CHOLHDL 2 08/26/2021  04/27/2020: 130/121/69/40 10/28/2019: 166/88/91/6007/03/2016: Lipids: 156/85/70/69 09/18/2015: 160/57/87/62 08/01/2014: 145/78/74/55 On lovastatin 20.  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  She had left eye cataract surgery.  -She has numbness and tingling in her right foot -sees Dr. Vickki [name], last foot exam 02/03/2022, at her first visit with him.  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  - She did not have RAI treatment at that time, however, she was found to have a thyroid mass in 2005 >> This was biopsied and returned as normal thyroid tissue, but had RAI treatment at that time.  - The posttreatment whole-body scan was negative for any cancer spread.   Thyroglobulin returned at 2.3 in 08/2014 >> I ordered a neck ultrasound that showed a stable nodule with a fatty hilum, consistent with a benign lymph node, but no other masses in the surgical bed.   The thyroglobulin level remained detectable, and was actually 9.9 in 10/2015.   A neck ultrasound (11/2015) showed a stable 1.4 nodule in the thyroid bed, consistent with a benign process   Her thyroglobulin levels continue to be detectable  A neck ultrasound (02/2020) was  negative for metastasis or recurrences  A whole-body scan (03/2020) was also negative for abnormal uptake  Reviewed her thyroglobulin and ATA antibodies: Lab Results  Component Value Date   THYROGLB 2.9 12/30/2021   THYROGLB 2.3 (L) 04/26/2021   THYROGLB 2.0 (L) 08/19/2020   THYROGLB 3.3 02/13/2020   THYROGLB 2.1 (L) 05/13/2019   THYROGLB 2.5 (L) 06/25/2018   THYROGLB 1.8 (L) 08/15/2017   THYROGLB 2.5 (L) 08/29/2016   THYROGLB 9.9 11/06/2015   THYROGLB 2.4 (L) 09/18/2015   THGAB <1 12/30/2021   THGAB <1 04/26/2021   THGAB <1 08/19/2020   THGAB <1 02/13/2020   THGAB <1 05/13/2019  THGAB <1 06/25/2018   THGAB <1 08/15/2017   THGAB <1 08/29/2016   THGAB <1 09/18/2015   THGAB <1.0 12/26/2014   Postsurgical hypothyroidism:  Reviewed her TFTs: Lab Results  Component Value Date   TSH 0.16 (L) 12/30/2021   TSH 0.20 (L) 08/26/2021   TSH 0.27 (L) 04/26/2021   TSH 1.50 10/07/2020   TSH 0.10 (L) 08/19/2020   FREET4 1.80 (H) 12/30/2021   FREET4 1.30 08/26/2021   FREET4 1.39 04/26/2021   FREET4 1.18 10/07/2020   FREET4 1.47 08/19/2020  10/28/2019: TSH 1.6 08/01/2014: TSH 1.89  Pt is on levothyroxine 127 mcg daily (decreased from 137 mcg daily 12/2021), taken 1 tablet 6/7, 1/2 tablet 1/7: - in am - fasting - at least 30 min from b'fast - no calcium - no iron - no multivitamins - no PPIs - not on Biotin On vitamin D - 50,000 units once a week.  No FH of thyroid cancer. No h/o radiation tx to head or neck other than RAI treatment. No Biotin use. No recent steroids use.   Pt denies: - feeling nodules in neck - hoarseness - dysphagia - choking  She has a h/o irregular menses. She developed postmenopausal bleeding in 2019 >> she had D&C on 09/01/2021.   ROS: + see HPI Neurological: no tremors/+ numbness/+ tingling/no dizziness  I reviewed pt's medications, allergies, PMH, social hx, family hx, and changes were documented in the history of present illness.  Otherwise, unchanged from my initial visit note.  Past Medical History:  Diagnosis Date   Anemia associated with chronic renal failure    Arthritis    Chronic kidney disease (CKD), stage III (moderate) ([place])    nephrologist-- dr s. Courtney [name]   Diabetic retinopathy of both eyes ([place])    followed by dr c. haddad in [place];  pt stated gets eye injections every 6 months   History of COVID-19 2020   per pt mild to moderate symptoms , no hospital admission,  symptoms resolved with exceptin taste/ smell still not right   History of thyroid cancer 09/2002   endocrinologist--- dr Cruzita Lederer;  s/p total thyroidectomy [id number] for multinodular goiter, dx follicular varient papillary thyroid cancer, no chemo/ radiation;   RAI for normal bx of mass in 2005;  no recurrence   Hx MRSA infection 06/2012   right thigh abscess with sepsis, hospital admission   Hyperlipidemia    Hypertension    Hypothyroidism, postsurgical 2004   endocrinologist-  dr Cruzita Lederer;   10-03-2002  s/p total thyroidectomy for multinodular goiter/ thyroid cancer   Insulin dependent type 2 diabetes mellitus [facility])    endocrinologist--- dr Cruzita Lederer;    (08-27-2021 per pt does not check blood sugar at home, only every once in a while)   Neuropathy due to secondary diabetes ([place])    PMB (postmenopausal bleeding)    Secondary hyperparathyroidism of renal origin [facility])    Wears glasses    Past Surgical History:  Procedure Laterality Date   APPLICATION OF WOUND VAC     07-11-2012 and 07-14-2012 '@[facility]' ;   to right thigh wound with dressing change   BILATERAL CARPAL TUNNEL RELEASE Bilateral 2003   CATARACT EXTRACTION W/ INTRAOCULAR LENS IMPLANT Bilateral    left 2016;  right 2018   CHOLECYSTECTOMY OPEN     1990s   COLONOSCOPY WITH PROPOFOL N/A 05/14/2020   Procedure: COLONOSCOPY WITH PROPOFOL;  Surgeon: Lin Landsman, MD;  Location: [place];  Service: Gastroenterology;  Laterality: N/A;   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    HYSTEROSCOPY WITH D & C N/A 09/01/2021   Procedure: DILATATION AND CURETTAGE Marlene Bast;  Surgeon: Griffin Basil, MD;  Location: [place];  Service: Gynecology;  Laterality: N/A;   INCISION AND DRAINAGE ABSCESS Right 07/08/2012   '@[facility]' ;  w/ excisional debridement right thigh   KNEE ARTHROSCOPY Left 05/05/2016   Procedure: ARTHROSCOPY KNEE;  Surgeon: Thornton Park, MD;  Location: [facility];  Service: Orthopedics;  Laterality: Left;   SHOULDER ARTHROSCOPY Left 03/2002   TOTAL THYROIDECTOMY Bilateral 10/03/2002   '@[facility]'    TRIGGER FINGER RELEASE     1990s ;   right hand   History   Social History Main Topics   Smoking status: Former Smoker   Smokeless tobacco: Not on file   Alcohol Use: No   Drug Use: No   Social History Narrative   Single   0 children   [place]travel[place]      Beer and wine on occasion   First menstrual cycle: 6th grade   Postmenopausal      Current Outpatient Medications  Medication Sig Dispense Refill   Acetaminophen (TYLENOL) 325 MG CAPS Take by mouth as needed.     allopurinol (ZYLOPRIM) 100 MG tablet Take 100 mg by mouth daily.     amLODipine (NORVASC) 10 MG tablet Take 10 mg by mouth daily.     Cholecalciferol 125 MCG (5000 UT) capsule Take 5,000 Units by mouth once a week. (Patient not taking: Reported on 03/28/2022)     diphenhydrAMINE (BENADRYL) 50 MG tablet Take 50 mg by mouth as needed.     empagliflozin (JARDIANCE) 10 MG TABS tablet Take 10 mg by mouth daily.     insulin regular (NOVOLIN R RELION) 100 units/mL injection INJECT 30 UNITS SUB-Q before b'fas. ReliOn insulin.  AND 25 UNITS BEFORE SUPPER     Insulin Syringe-Needle U-100 31G X 15/64" 0.5 ML MISC Use 3x a day 100 each 11   levothyroxine (EUTHYROX) 125 MCG tablet Take 1 tablet (125 mcg total) by mouth daily before breakfast. 45 tablet 3   lovastatin (MEVACOR) 20 MG tablet Take 20 mg by mouth daily.     medroxyPROGESTERone (PROVERA) 10 MG  tablet Take 1 tablet (10 mg total) by mouth daily. Use for ten days 30 tablet 8   misoprostol (CYTOTEC) 100 MCG tablet 1/2 tab per vagina the night before procedure 1 tablet 0   oxyCODONE (OXY IR/ROXICODONE) 5 MG immediate release tablet Take 1 tablet (5 mg total) by mouth every 8 (eight) hours as needed for severe pain or breakthrough pain. (Patient not taking: Reported on 03/28/2022) 4 tablet 0   OZEMPIC, 1 MG/DOSE, 4 MG/3ML SOPN INJECT 1MG SUBCUTANEOUSLY ONCE WEEKLY 9 mL 1   Polyvinyl Alcohol-Povidone (REFRESH OP) Apply to eye as needed.     terconazole (TERAZOL 7) 0.4 % vaginal cream Place 1 applicator vaginally at bedtime. (Patient not taking: Reported on 03/28/2022) 45 g 0   torsemide (DEMADEX) 20 MG tablet Take 20 mg by mouth daily.     TRESIBA FLEXTOUCH 200 UNIT/ML FlexTouch Pen INJECT 70 UNITS SUBCUTANEOUSLY ONCE DAILY 27 mL 1   No current facility-administered medications for this visit.   NKDA   Family History  Problem Relation Age of Onset   Hypertension Mother    Thyroid disease Mother    CVA Mother    Hypertension Sister    Thyroid disease Sister    Hypertension Brother    Hyperlipidemia Brother  PE: There were no vitals taken for this visit. There is no height or weight on file to calculate BMI. Wt Readings from Last 3 Encounters:  03/28/22 237 lb 1.6 oz (107.5 kg)  02/28/22 238 lb 9.6 oz (108.2 kg)  12/30/21 242 lb 6.4 oz (110 kg)   Constitutional: overweight, in NAD Eyes: no exophthalmos ENT: moist mucous membranes, no masses palpated in neck, no cervical lymphadenopathy Cardiovascular: RRR, No MRG Respiratory: CTA B Musculoskeletal: no deformities Skin: moist, warm, no rashes Neurological: no tremor with outstretched hands  ASSESSMENT: 1. DM2, insulin-dependent, uncontrolled, with complications - CKD - DR  She does not have a family history of medullary thyroid cancer a personal history of pancreatitis.  2. 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 She did not have RAI treatment at that time, however, she was found to have a thyroid mass in 2005. This was biopsied and returned as normal thyroid tissue.She had RAI treatment at that time. The posttreatment whole-body scan was negative for any cancers.  - Neck U/S (09/19/2014): 1. Post total thyroidectomy. 2. No change in the previously biopsied approximately 1.4 cm echogenic solid nodule within the inferior aspect of the right lobe of the thyroid, grossly unchanged since the 2005 examination. 3. Apparent development of an approximately 0.4 cm hypoechoic nodule within the right thyroidectomy bed - while too small for definitive characterization, this nodule appears to contain an echogenic hilum and thus is favored to represent a non pathologically enlarged cervical lymph node.  - Neck U/S (11/27/2015):  1. Surgical changes of prior total thyroidectomy. 2. Unchanged 1.4 cm echogenic soft tissue nodule in the right thyroid resection bed. Continued stability over time consistent with a benign process.  - Neck U/S (03/02/2020): Stable surgical changes following total thyroidectomy. No residual thyroid bed abnormality.  - WBS (03/27/2020): her copay: 6800$!!  No evidence of local thyroid cancer recurrence or distant metastasis by I 131 imaging.   3. Post surgical hypothyroidism  PLAN:  1. Patient with longstanding, uncontrolled, type 2 diabetes, on basal-bolus insulin regimen and also SGLT2 inhibitor and GLP-1 receptor agonist.  We were able to switch from [place] to [place] after she changed her insurance to [employer].  At last visit HbA1c was stable, at 7.8%.  She was only checking sugars in the morning and they were mostly at goal, with only occasional higher blood sugars after dietary indiscretions the night before.   I advised her to also check some sugars later in the day.  I also strongly advised him to stop sweet tea.  We did not change the regimen otherwise. -At today's visit, sugars are much better, with the majority of her blood sugars at goal.  She is tolerating well her regimen.  For now, we will not change it.  She is, however, interested in changing from vials to pens of regular insulin.  Since this is now available in a  pen, I sent a prescription for the ReliOn FlexPen's to [facility]. -I also refilled the rest of her medicines -We again discussed about the importance of stopping sweet tea - I advised her to: Patient Instructions  Please continue: - Jardiance 10 mg before b'fast - Tresiba 70 units in am - Regular insulin 30 units before breakfast and 25 units before dinner - Ozempic 1 mg weekly in a.m.  Please continue levothyroxine 125 mcg daily.  Take the thyroid hormone every day, with water, at least 30 minutes before breakfast, separated by at least 4 hours from: - acid reflux medications - calcium - iron - multivitamins  Please stop at the lab.  Please return in 4 months with your sugar log.   - we checked her HbA1c: 7.1% (lower) - advised to check sugars at different times of the day - 2x a day, rotating check times - advised for yearly eye exams >> she is UTD - return to clinic in 4 months       2. 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  She has persistently detectable thyroglobulin. -Reviewed the results of her neck ultrasound from 11/2015, which showed a stable mass, consistent with a benign process.  Repeat neck ultrasound in 02/2020, showed stable surgical changes with no residual thyroid abnormalities.  A whole-body scan in 03/2020 showed no evidence of local recurrence or metastasis.  This was a very expensive test for her and she had to pay approximately $7000 for  it.  -She has no neck compression symptoms -Her thyroglobulin levels remain detectable, with the last being slightly higher, at 2.9 -At that time, we discussed that she may need a PET scan or at least a CT scan.  I ordered a CT scan but she did not have this yet.  She mentions that this was not covered by her insurance the weight was ordered initially, but I changed the order to only obtain a CT with contrast and she did not call to schedule this yet.  I advised her to do so. -At next visit, we will recheck her thyroglobulin to see if there is a consistent upward trend  3. Postsurgical hypothyroidism - latest thyroid labs reviewed with pt. >> TSH was suppressed, so I advised her to reduce the levothyroxine dose: Lab Results  Component Value Date   TSH 0.16 (L) 12/30/2021  - she continues on LT4 127 mcg daily (she takes 1 tablet of 137 mcg 6 out of 7 days and half a tablet 1 out of 7 days. - pt feels good on this dose. - we discussed about taking the thyroid hormone every day, with water, >30 minutes before breakfast, separated by >4 hours from acid reflux medications, calcium, iron, multivitamins. Pt. is taking it correctly. - will check thyroid tests today: TSH and fT4 - If labs are abnormal, she will need to return for repeat TFTs in 1.5 months  Component     Latest Ref Rng 05/04/2022  TSH     0.35 - 5.50 uIU/mL 0.43   T4,Free(Direct)     0.60 - 1.60 ng/dL 1.70 (H)     TFTs at goal.  [name], MD PhD [facility] Endocrinology

## 2022-05-05 DIAGNOSIS — N183 Chronic kidney disease, stage 3 unspecified: Secondary | ICD-10-CM | POA: Diagnosis not present

## 2022-05-05 DIAGNOSIS — E1122 Type 2 diabetes mellitus with diabetic chronic kidney disease: Secondary | ICD-10-CM | POA: Diagnosis not present

## 2022-05-05 DIAGNOSIS — E890 Postprocedural hypothyroidism: Secondary | ICD-10-CM | POA: Diagnosis not present

## 2022-05-05 DIAGNOSIS — R5383 Other fatigue: Secondary | ICD-10-CM | POA: Diagnosis not present

## 2022-05-05 DIAGNOSIS — E785 Hyperlipidemia, unspecified: Secondary | ICD-10-CM | POA: Diagnosis not present

## 2022-05-05 DIAGNOSIS — N8500 Endometrial hyperplasia, unspecified: Secondary | ICD-10-CM | POA: Diagnosis not present

## 2022-05-05 DIAGNOSIS — M109 Gout, unspecified: Secondary | ICD-10-CM | POA: Diagnosis not present

## 2022-05-05 DIAGNOSIS — I10 Essential (primary) hypertension: Secondary | ICD-10-CM | POA: Diagnosis not present

## 2022-05-06 ENCOUNTER — Telehealth: Payer: Self-pay

## 2022-05-06 DIAGNOSIS — E1142 Type 2 diabetes mellitus with diabetic polyneuropathy: Secondary | ICD-10-CM

## 2022-05-06 NOTE — Telephone Encounter (Signed)
Pt will contacted insurance and see if there are some alternative pens that are covered.

## 2022-05-06 NOTE — Telephone Encounter (Signed)
T, It is probably this expensive as I tried to send pens to her pharmacy...  She can pick it up without prescription from [place], in a vial.  We can continue the same doses.

## 2022-05-06 NOTE — Telephone Encounter (Signed)
Pt advised Novolin rx was $85 and she is requesting a cheaper insulin be sent to the pharmacy.

## 2022-05-06 NOTE — Telephone Encounter (Signed)
Inbound call from patient requesting form be completed and faxed with clinical notes. DME supplies ordered via [employer] through online portal.

## 2022-05-11 ENCOUNTER — Other Ambulatory Visit (HOSPITAL_COMMUNITY): Payer: Self-pay

## 2022-05-11 DIAGNOSIS — Z794 Long term (current) use of insulin: Secondary | ICD-10-CM | POA: Diagnosis not present

## 2022-05-11 DIAGNOSIS — E1165 Type 2 diabetes mellitus with hyperglycemia: Secondary | ICD-10-CM | POA: Diagnosis not present

## 2022-05-11 DIAGNOSIS — E1142 Type 2 diabetes mellitus with diabetic polyneuropathy: Secondary | ICD-10-CM | POA: Diagnosis not present

## 2022-05-12 DIAGNOSIS — E1165 Type 2 diabetes mellitus with hyperglycemia: Secondary | ICD-10-CM | POA: Diagnosis not present

## 2022-05-12 DIAGNOSIS — Z794 Long term (current) use of insulin: Secondary | ICD-10-CM | POA: Diagnosis not present

## 2022-05-12 DIAGNOSIS — E1142 Type 2 diabetes mellitus with diabetic polyneuropathy: Secondary | ICD-10-CM | POA: Diagnosis not present

## 2022-05-12 MED ORDER — INSULIN REGULAR HUMAN 100 UNIT/ML IJ SOLN: 60.0000 [IU] | Freq: Every day | INTRAMUSCULAR | 1 refills | Status: DC | PRN

## 2022-05-12 MED ORDER — "INSULIN SYRINGE-NEEDLE U-100 31G X 15/64"" 0.5 ML MISC": 3 refills | Status: AC

## 2022-05-12 NOTE — Addendum Note (Signed)
Addended by: Lauralyn Primes on: 05/12/2022 11:15 AM   Modules accepted: Orders

## 2022-05-12 NOTE — Telephone Encounter (Signed)
Pt requested Novolin vials and syringes be sent to preferred pharmacy until she can get insurance to cover insulin pen. Rx sent to pharmacy.

## 2022-05-17 ENCOUNTER — Other Ambulatory Visit: Payer: Self-pay | Admitting: Physician Assistant

## 2022-05-17 DIAGNOSIS — Z1231 Encounter for screening mammogram for malignant neoplasm of breast: Secondary | ICD-10-CM

## 2022-05-17 MED ORDER — INSULIN REGULAR HUMAN 100 UNIT/ML IJ SOLN: INTRAMUSCULAR | 1 refills | Status: DC

## 2022-05-17 NOTE — Telephone Encounter (Signed)
Rx for Novolin R vials sent to preferred pharmacy.

## 2022-05-17 NOTE — Addendum Note (Signed)
Addended by: Lauralyn Primes on: 05/17/2022 04:57 PM   Modules accepted: Orders

## 2022-06-14 ENCOUNTER — Other Ambulatory Visit: Payer: Self-pay | Admitting: Obstetrics and Gynecology

## 2022-06-14 ENCOUNTER — Other Ambulatory Visit: Payer: Self-pay | Admitting: Internal Medicine

## 2022-06-14 DIAGNOSIS — N8501 Benign endometrial hyperplasia: Secondary | ICD-10-CM

## 2022-06-17 DIAGNOSIS — E113513 Type 2 diabetes mellitus with proliferative diabetic retinopathy with macular edema, bilateral: Secondary | ICD-10-CM | POA: Diagnosis not present

## 2022-06-24 ENCOUNTER — Ambulatory Visit
Admission: RE | Admit: 2022-06-24 | Discharge: 2022-06-24 | Disposition: A | Discharge status: Home / Self Care | Payer: Medicare HMO | Source: Ambulatory Visit | Attending: Physician Assistant | Admitting: Physician Assistant

## 2022-06-24 DIAGNOSIS — Z1231 Encounter for screening mammogram for malignant neoplasm of breast: Secondary | ICD-10-CM

## 2022-06-28 ENCOUNTER — Other Ambulatory Visit: Payer: Self-pay

## 2022-06-28 DIAGNOSIS — E1165 Type 2 diabetes mellitus with hyperglycemia: Secondary | ICD-10-CM

## 2022-06-28 MED ORDER — FREESTYLE LIBRE 2 SENSOR MISC: 1.0000 | 3 refills | Status: DC

## 2022-07-29 DIAGNOSIS — E113513 Type 2 diabetes mellitus with proliferative diabetic retinopathy with macular edema, bilateral: Secondary | ICD-10-CM | POA: Diagnosis not present

## 2022-08-10 DIAGNOSIS — Z794 Long term (current) use of insulin: Secondary | ICD-10-CM | POA: Diagnosis not present

## 2022-08-10 DIAGNOSIS — E1165 Type 2 diabetes mellitus with hyperglycemia: Secondary | ICD-10-CM | POA: Diagnosis not present

## 2022-08-10 DIAGNOSIS — E1142 Type 2 diabetes mellitus with diabetic polyneuropathy: Secondary | ICD-10-CM | POA: Diagnosis not present

## 2022-08-22 DIAGNOSIS — I10 Essential (primary) hypertension: Secondary | ICD-10-CM | POA: Diagnosis not present

## 2022-08-22 DIAGNOSIS — N2581 Secondary hyperparathyroidism of renal origin: Secondary | ICD-10-CM | POA: Diagnosis not present

## 2022-08-22 DIAGNOSIS — N1832 Chronic kidney disease, stage 3b: Secondary | ICD-10-CM | POA: Diagnosis not present

## 2022-08-22 DIAGNOSIS — E1122 Type 2 diabetes mellitus with diabetic chronic kidney disease: Secondary | ICD-10-CM | POA: Diagnosis not present

## 2022-08-22 DIAGNOSIS — R809 Proteinuria, unspecified: Secondary | ICD-10-CM | POA: Diagnosis not present

## 2022-09-09 ENCOUNTER — Ambulatory Visit (INDEPENDENT_AMBULATORY_CARE_PROVIDER_SITE_OTHER): Payer: Medicare HMO | Admitting: Internal Medicine

## 2022-09-09 ENCOUNTER — Encounter: Payer: Self-pay | Admitting: Internal Medicine

## 2022-09-09 VITALS — BP 130/72 | HR 89 | Ht 67.5 in | Wt 235.6 lb

## 2022-09-09 DIAGNOSIS — E1165 Type 2 diabetes mellitus with hyperglycemia: Secondary | ICD-10-CM | POA: Diagnosis not present

## 2022-09-09 DIAGNOSIS — E890 Postprocedural hypothyroidism: Secondary | ICD-10-CM

## 2022-09-09 DIAGNOSIS — E7849 Other hyperlipidemia: Secondary | ICD-10-CM

## 2022-09-09 DIAGNOSIS — E1142 Type 2 diabetes mellitus with diabetic polyneuropathy: Secondary | ICD-10-CM

## 2022-09-09 DIAGNOSIS — C73 Malignant neoplasm of thyroid gland: Secondary | ICD-10-CM | POA: Diagnosis not present

## 2022-09-09 LAB — POCT GLYCOSYLATED HEMOGLOBIN (HGB A1C): Hemoglobin A1C: 6.8 % — AB (ref 4.0–5.6)

## 2022-09-09 MED ORDER — LEVOTHYROXINE SODIUM 125 MCG PO TABS: 125.0000 ug | ORAL_TABLET | Freq: Every day | ORAL | 3 refills | Status: DC

## 2022-09-09 NOTE — Patient Instructions (Addendum)
Please continue: - Jardiance 10 mg before b'fast - Tresiba 70 units in am - Regular insulin 20 units before breakfast and 20 units before dinner; add 5-10 units of regular insulin before a snack with carbs (crackers, popcorn) - Ozempic 1 mg weekly in a.m.   Please continue levothyroxine 137 mcg 6 out of 7 days and half a tablet 1 out of 7 days.  Take the thyroid hormone every day, with water, at least 30 minutes before breakfast, separated by at least 4 hours from: - acid reflux medications - calcium - iron - multivitamins  Please look up Alpha Lipoic Acid 600 mg 2x a day.  Please return in 4 months.

## 2022-09-09 NOTE — Progress Notes (Signed)
Patient ID: BONETTA MOSTEK, female   DOB: 11-28-1955, 66 y.o.   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 Last visit 4 months ago.  Interim history: She has increased urination after she started [place] -now on Jardiance; no blurry vision, nausea, chest pain.  She is not having regular meals.  She continues to drink sweet drinks. She was able to obtain a CGM since last visit.  DM2: Reviewed HbA1c levels: Lab Results  Component Value Date   HGBA1C 7.1 (A) 05/04/2022   HGBA1C 7.8 (A) 08/26/2021   HGBA1C 7.0 (A) 04/26/2021   HGBA1C 6.9 (A) 12/23/2020   HGBA1C 6.8 (A) 08/19/2020   HGBA1C 7.8 (A) 05/19/2020   HGBA1C 10.7 (A) 02/13/2020   HGBA1C 12.6 (A) 11/13/2019   HGBA1C 9.8 (A) 05/13/2019   HGBA1C 13.7 (A) 10/23/2018   HGBA1C 10.1 (A) 06/25/2018   HGBA1C 8.6 (A) 02/14/2018   HGBA1C 8.3 11/13/2017   HGBA1C 9.1 08/16/2017   HGBA1C 11.3 05/31/2017   HGBA1C 11.9 02/24/2017   HGBA1C 9.5 11/25/2016   HGBA1C 8.2 (H) 04/13/2016   HGBA1C 9.1 02/05/2016   HGBA1C 9.8 (H) 09/18/2015   HGBA1C 9.9 06/19/2015   HGBA1C 11.0 03/27/2015   HGBA1C 10.9 (H) 12/26/2014  08/22/2016: HbA1c 11.8% 08/01/2014: HbA1c 14.5% 05/02/2014: HbA1c 14.8% 01/31/2014: HbA1c 13.6% 11/01/2013: HbA1c 15.1%  She is on: - Tresiba 70 units in a.m. - Novolin Regular insulin 30 units before breakfast  30 >> 25 units before dinner >> 20 units 2x a day - Ozempic 0.5 >> 1 mg weekly in a.m.  -  Jardiance 10 mg daily - started by Dr. Juleen China 01/2021 Stopped Metformin 500 mg 2x a day - b/c leg swelling. She was on Novolin 70/30 48 units 2x a day 15 min before the meals  She was on Amaryl 8 mg in am She was on Metformin >> stopped 2/2 CKD.  She checks sugars >4x a day:  Prev.: - am:  94-135, 169  >> 93-147, 167, 175 >> 78-139, 155 - 2h after b'fast: n/c - before lunch: 135 >> 120-140 >> 101, 106 >> n/c - 2h after lunch: n/c >> 67, 228 >> n/c - before dinner:  65, 95-134, 165 >> 258  >> 81-139 - 2h after dinner: n/c >> 131, 269 >> n/c - bedtime:  287 >> n/c > 108, 168 >> n/c >> 131 >> n/c - nighttime: n/c  Lowest sugar was 63 >> 78 >> 75 >> 65 >> 93; it is unclear at which level she has hypoglycemia Highest sugar was 325 >> .Marland Kitchen.169 >> 169 >> 253.  Glucometer: Prodigy >> ReliOn  Pt's meals are: - Breakfast: chicken  - Lunch: peanuts, sandwich + crystal lite drink - Dinner: frozen dinner - Snacks: fruit, small bag potato chips, peanuts She was drinking sweet tea and sodas in the past and I strongly advised her to stop.  -+ Stage III CKD: 12/14/2021 -reviewed nephrology labs: Glucose 65 - 139 mg/dL 73   BUN 7 - 25 mg/dL 14   Creatinine 0.50 - 1.05 mg/dL 1.79 High    eGFR CKD-EPI CR 2021 > OR = 60 mL/min/1.72m 31 Low    BUN/Creatinine Ratio 6 - 22 (calc) 8   Sodium 135 - 146 mmol/L 144   Potassium 3.5 - 5.3 mmol/L 4.0   Chloride  98 - 110 mmol/L 108   Bicarbonate (CO2) 20 - 32 mmol/L 27   Calcium 8.6 - 10.4 mg/dL 9.3   Phosphorus 2.1 - 4.3 mg/dL 3.8   Albumin 3.6 - 5.1 g/dL 3.8    Creatinine, Ur 20 - 275 mg/dL 178   Urine Protein/Creatinine Ratio 24 - 184 mg/g creat 534 High    Protein/Creatinine Ratio, Urine 0.024 - 0.184 mg/mg creat 0.534 High    Protein Urine Random 5 - 24 mg/dL 95 High     Lab Results  Component Value Date   BUN 15 09/01/2021   Lab Results  Component Value Date   CREATININE 1.33 (H) 09/01/2021  04/20/2021: 18/1.65 04/27/2020: CMP normal with the exception of glucose 136, BUN/creatinine 22/2.18, GFR 27, calcium 8.6 (8.7-10.3), alkaline phosphatase 145 (48-121), ALT 33 (0-32), Vitamin D 23.5. In PCPs note, it was mentioned that she was lost for follow-up with nephrology and she was referred back to [facility]. 10/28/2019:  Glucose 96, BUN/creatinine 12/1.47, GFR 43 08/22/2016: BUN/creatinine 20/1.41 03/18/2016: BUN/creatinine 16/1.21, EGFR 57, Calcium 8.7, AST/ALT 17/24 09/18/2015: BUN/creatinine 14/1.2, GFR 57 (previously 55) 08/01/2014: 15/1.26, GFR 55 On Cozaar.  -+ HL; lipids: Lab Results  Component Value Date   CHOL 143 08/26/2021   HDL 77.60 08/26/2021   LDLCALC 51 08/26/2021   TRIG 70.0 08/26/2021   CHOLHDL 2 08/26/2021  04/27/2020: 130/121/69/40 10/28/2019: 166/88/91/6007/03/2016: Lipids: 156/85/70/69 09/18/2015: 160/57/87/62 08/01/2014: 145/78/74/55 On lovastatin 20.  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  She had left eye cataract surgery.  - She has numbness and tingling in her right foot -sees Dr. Vickki Muff, last foot exam 02/03/2022, at her first visit with him.  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  - She did not have RAI treatment at that time, however, she was found to have a thyroid mass in 2005 >> This was biopsied and returned as normal thyroid tissue, but had RAI treatment at that time.  - The posttreatment whole-body scan was negative for any cancer spread.   Thyroglobulin returned at 2.3 in 08/2014 >> I ordered a neck ultrasound that showed a stable nodule with a fatty hilum, consistent with a benign lymph node, but no other masses in the surgical bed.   The thyroglobulin level remained detectable, and was actually 9.9 in 10/2015.   A neck ultrasound (11/2015) showed a stable 1.4 nodule in the thyroid bed, consistent with a benign process   Her thyroglobulin levels continue to be detectable  A neck ultrasound (02/2020) was negative for metastasis or recurrences  A whole-body scan (03/2020) was  also negative for abnormal uptake  Reviewed her thyroglobulin and ATA antibodies: Lab Results  Component Value Date   THYROGLB 2.9 12/30/2021   THYROGLB 2.3 (L) 04/26/2021   THYROGLB 2.0 (L) 08/19/2020   THYROGLB 3.3 02/13/2020   THYROGLB 2.1 (L) 05/13/2019   THYROGLB 2.5 (L) 06/25/2018   THYROGLB 1.8 (L) 08/15/2017   THYROGLB 2.5 (L) 08/29/2016   THYROGLB 9.9 11/06/2015   THYROGLB 2.4 (L) 09/18/2015   THGAB <1 12/30/2021   THGAB <1 04/26/2021   THGAB <1 08/19/2020   THGAB <1 02/13/2020   THGAB <1 05/13/2019   THGAB <1 06/25/2018   THGAB <1  08/15/2017   THGAB <1 08/29/2016   THGAB <1 09/18/2015   THGAB <1.0 12/26/2014   Postsurgical hypothyroidism:  Reviewed her TFTs: Lab Results  Component Value Date   TSH 0.43 05/04/2022   TSH 0.16 (L) 12/30/2021   TSH 0.20 (L) 08/26/2021   TSH 0.27 (L) 04/26/2021   TSH 1.50 10/07/2020   FREET4 1.70 (H) 05/04/2022   FREET4 1.80 (H) 12/30/2021   FREET4 1.30 08/26/2021   FREET4 1.39 04/26/2021   FREET4 1.18 10/07/2020  10/28/2019: TSH 1.6 08/01/2014: TSH 1.89  Pt is on levothyroxine 127 mcg daily (decreased from 137 mcg daily 12/2021), taken 1 tablet 6/7, 1/2 tablet 1/7: - in am - fasting - at least 30 min from b'fast - no calcium - no iron - no multivitamins - no PPIs - not on Biotin On vitamin D - 50,000 units once a week.  No FH of thyroid cancer. No h/o radiation tx to head or neck other than RAI treatment. No Biotin use. No recent steroids use.   Pt denies: - feeling nodules in neck - hoarseness - dysphagia - choking  She has a h/o irregular menses. She developed postmenopausal bleeding in 2019 >> she had D&C on 09/01/2021.   ROS: + see HPI Neurological: no tremors/+ numbness/+ tingling/no dizziness  I reviewed pt's medications, allergies, PMH, social hx, family hx, and changes were documented in the history of present illness. Otherwise, unchanged from my initial visit note.  Past Medical History:   Diagnosis Date   Anemia associated with chronic renal failure    Arthritis    Chronic kidney disease (CKD), stage III (moderate) ([place])    nephrologist-- dr s. Juleen China   Diabetic retinopathy of both eyes ([place])    followed by dr c. haddad in [place];  pt stated gets eye injections every 6 months   History of COVID-19 2020   per pt mild to moderate symptoms , no hospital admission,  symptoms resolved with exceptin taste/ smell still not right   History of thyroid cancer 09/2002   endocrinologist--- dr Cruzita Lederer;  s/p total thyroidectomy [phone] for multinodular goiter, dx follicular varient papillary thyroid cancer, no chemo/ radiation;   RAI for normal bx of mass in 2005;  no recurrence   Hx MRSA infection 06/2012   right thigh abscess with sepsis, hospital admission   Hyperlipidemia    Hypertension    Hypothyroidism, postsurgical 2004   endocrinologist-  dr Cruzita Lederer;   10-03-2002  s/p total thyroidectomy for multinodular goiter/ thyroid cancer   Insulin dependent type 2 diabetes mellitus [facility])    endocrinologist--- dr Cruzita Lederer;    (08-27-2021 per pt does not check blood sugar at home, only every once in a while)   Neuropathy due to secondary diabetes ([place])    PMB (postmenopausal bleeding)    Secondary hyperparathyroidism of renal origin [facility])    Wears glasses    Past Surgical History:  Procedure Laterality Date   APPLICATION OF WOUND VAC     07-11-2012 and 07-14-2012 _0 ;   to right thigh wound with dressing change   BILATERAL CARPAL TUNNEL RELEASE Bilateral 2003   CATARACT EXTRACTION W/ INTRAOCULAR LENS IMPLANT Bilateral    left 2016;  right 2018   CHOLECYSTECTOMY OPEN     1990s   COLONOSCOPY WITH PROPOFOL N/A 05/14/2020   Procedure: COLONOSCOPY WITH PROPOFOL;  Surgeon: Lin Landsman, MD;  Location: [place];  Service: Gastroenterology;  Laterality: N/A;   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   HYSTEROSCOPY  WITH D & C N/A 09/01/2021   Procedure: DILATATION AND  CURETTAGE Marlene Bast;  Surgeon: Griffin Basil, MD;  Location: [facility];  Service: Gynecology;  Laterality: N/A;   INCISION AND DRAINAGE ABSCESS Right 07/08/2012   _0 ;  w/ excisional debridement right thigh   KNEE ARTHROSCOPY Left 05/05/2016   Procedure: ARTHROSCOPY KNEE;  Surgeon: Thornton Park, MD;  Location: [facility];  Service: Orthopedics;  Laterality: Left;   SHOULDER ARTHROSCOPY Left 03/2002   TOTAL THYROIDECTOMY Bilateral 10/03/2002   _1    TRIGGER FINGER RELEASE     1990s ;   right hand   History   Social History Main Topics   Smoking status: Former Smoker   Smokeless tobacco: Not on file   Alcohol Use: No   Drug Use: No   Social History Narrative   Single   0 children   [place]travel[place]      Beer and wine on occasion   First menstrual cycle: 6th grade   Postmenopausal      Current Outpatient Medications  Medication Sig Dispense Refill   Acetaminophen (TYLENOL) 325 MG CAPS Take by mouth as needed.     allopurinol (ZYLOPRIM) 100 MG tablet Take 100 mg by mouth daily.     amLODipine (NORVASC) 10 MG tablet Take 10 mg by mouth daily.     Cholecalciferol 125 MCG (5000 UT) capsule Take 5,000 Units by mouth once a week. (Patient not taking: Reported on 03/28/2022)     Continuous Blood Gluc Receiver (FREESTYLE LIBRE 2 READER) DEVI 1 each by Does not apply route daily. 1 each 0   Continuous Blood Gluc Sensor (FREESTYLE LIBRE 2 SENSOR) MISC 1 each by Does not apply route every 14 (fourteen) days. E11.65 6 each 3   diphenhydrAMINE (BENADRYL) 50 MG tablet Take 50 mg by mouth as needed.     empagliflozin (JARDIANCE) 10 MG TABS tablet Take 10 mg by mouth daily.     insulin degludec (TRESIBA FLEXTOUCH) 200 UNIT/ML FlexTouch Pen Inject 70 units daily under skin 27 mL 3   Insulin Pen Needle 32G X 4 MM MISC Use 3x a day 300 each 3   insulin regular (NOVOLIN R) 100 units/mL injection Inject up to 60 units under skin as advised 60  mL 1   Insulin Syringe-Needle U-100 31G X 15/64" 0.5 ML MISC Use 3x a day 300 each 3   levothyroxine (SYNTHROID) 137 MCG tablet TAKE 1 TABLET BY MOUTH ONCE DAILY BEFORE BREAKFAST 90 tablet 0   lovastatin (MEVACOR) 20 MG tablet Take 20 mg by mouth daily.     medroxyPROGESTERone (PROVERA) 10 MG tablet Take 1 tablet (10 mg total) by mouth daily. 30 tablet 8   misoprostol (CYTOTEC) 100 MCG tablet 1/2 tab per vagina the night before procedure 1 tablet 0   Polyvinyl Alcohol-Povidone (REFRESH OP) Apply to eye as needed.     Semaglutide, 1 MG/DOSE, (OZEMPIC, 1 MG/DOSE,) 4 MG/3ML SOPN INJECT 1MG SUBCUTANEOUSLY ONCE WEEKLY 9 mL 3   terconazole (TERAZOL 7) 0.4 % vaginal cream Place 1 applicator vaginally at bedtime. (Patient not taking: Reported on 03/28/2022) 45 g 0   torsemide (DEMADEX) 20 MG tablet Take 20 mg by mouth daily.     No current facility-administered medications for this visit.   NKDA   Family History  Problem Relation Age of Onset   Hypertension Mother    Thyroid disease Mother    CVA Mother    Hypertension Sister  Thyroid disease Sister    Hypertension Brother    Hyperlipidemia Brother    PE:  BP 130/72 (BP Location: Right Arm, Patient Position: Sitting, Cuff Size: Normal)   Pulse 89   Ht 5' 7.5" (1.715 m)   Wt 235 lb 9.6 oz (106.9 kg)   SpO2 99%   BMI 36.36 kg/m  Wt Readings from Last 3 Encounters:  09/09/22 235 lb 9.6 oz (106.9 kg)  05/04/22 238 lb (108 kg)  03/28/22 237 lb 1.6 oz (107.5 kg)   Constitutional: overweight, in NAD Eyes: no exophthalmos ENT: no masses palpated in neck, no cervical lymphadenopathy Cardiovascular: RRR, No MRG Respiratory: CTA B Musculoskeletal: no deformities Skin:  no rashes Neurological: no tremor with outstretched hands  ASSESSMENT: 1. DM2, insulin-dependent, uncontrolled, with complications - CKD - DR  She does not have a family history of medullary thyroid cancer a personal history of pancreatitis.  2. 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 She did not have RAI treatment at that time, however, she was found to have a thyroid mass in 2005. This was biopsied and returned as normal thyroid tissue.She had RAI treatment at that time. The posttreatment whole-body scan was negative for any cancers.  - Neck U/S (09/19/2014): 1. Post total thyroidectomy. 2. No change in the previously biopsied approximately 1.4 cm echogenic solid nodule within the inferior aspect of the right lobe of the thyroid, grossly unchanged since the 2005 examination. 3. Apparent development of an approximately 0.4 cm hypoechoic nodule within the right thyroidectomy bed - while too small for definitive characterization, this nodule appears to contain an echogenic hilum and thus is favored to represent a non pathologically enlarged cervical lymph node.  - Neck U/S (11/27/2015):  1. Surgical changes of prior total thyroidectomy. 2. Unchanged 1.4 cm echogenic soft tissue nodule in the right thyroid resection bed. Continued stability over time consistent with a benign process.  - Neck U/S (03/02/2020): Stable surgical changes following total thyroidectomy. No residual thyroid bed abnormality.  - WBS (03/27/2020): her copay: 6800$!!  No evidence of local thyroid cancer recurrence or distant metastasis by I 131 imaging.   3. Post surgical hypothyroidism  PLAN:  1. Patient with longstanding, uncontrolled, type 2 diabetes, on basal/bolus insulin regimen and also SGLT2 inhibitor and GLP-1 receptor agonist, with improved control at last visit, when an HbA1c returned 7.1%.  We were able to switch from [place] to [place] after she changed her insurance to [employer].  Sugars improved afterwards.  They were much better at last visit with the majority of her blood sugars at  goal.  She was interested in changing from vials to pens of regular insulin.  I sent a prescription for the ReliOn FlexPen to [place].  However, this was too expensive so we had to go back to vials since.  We also discussed about the importance of stopping sweet tea. CGM interpretation: -At today's visit, we reviewed her CGM downloads: It appears that 87% of values are in target range (goal >70%), while 12% are higher than 180 (goal <25%), and 1% are lower than 70 (goal <4%).  The calculated average blood sugar is 131.  The projected HbA1c for the next 3 months ([facility]MI) is 6.4%. -Reviewing the CGM trends, sugars appear to be mostly fluctuating within the target range, with values at goal overnight and an increase  after breakfast, after which sugars remain higher in the target range.  She also has occasional hyperglycemic spikes throughout the day, due to dietary indiscretions, and the fact that she does not usually cover her snacks.  We discussed about carbs in the snacks and how they increase the blood sugars.  I again advised him to stop sweet drinks and if she does have a snack that contains carbs to cover it with a smaller amount of regular insulin.  Otherwise, she does not need a change in regimen. -She has neuropathic symptoms and we discussed about using alpha lipoic acid - I advised her to: Patient Instructions  Please continue: - Jardiance 10 mg before b'fast - Tresiba 70 units in am - Regular insulin 20 units before breakfast and 20 units before dinner; add 5-10 units of regular insulin before a snack with carbs (crackers, popcorn) - Ozempic 1 mg weekly in a.m.   Please continue levothyroxine 137 mcg 6 out of 7 days and half a tablet 1 out of 7 days.  Take the thyroid hormone every day, with water, at least 30 minutes before breakfast, separated by at least 4 hours from: - acid reflux medications - calcium - iron - multivitamins  Please look up Alpha Lipoic Acid 600 mg 2x a day.  Please  return in 4 months.  - we checked her HbA1c: 6.8% (lower) - advised to check sugars at different times of the day - 3-4x a day, rotating check times - advised for yearly eye exams >> she is UTD - return to clinic in 4 months       2. 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  She has persistently detectable thyroglobulin. -Reviewed the results of her neck ultrasound from 11/2015, which showed a stable mass, consistent with a benign process.  Repeat neck ultrasound in 02/2020, showed stable surgical changes with no residual thyroid abnormalities.  A whole-body scan in 03/2020 showed no evidence of local recurrence or metastasis.  This was a very expensive test for her and she had to pay approximately $7000 for it.  -No neck compression symptoms -Her thyroglobulin levels remain detectable, with the latest level being slightly higher, at 2.9 -At that time, we discussed that she may need a PET scan or at least a CT scan.  I ordered a CT scan but she did not have this yet.  She mentions that this was not covered by her insurance the way was ordered initially, but I changed the order to only obtain a CT with contrast and she did not call to schedule this yet.  At last visit I advised her to call and schedule it but she still did not have this yet. -We will recheck her thyroglobulin at next visit to see if there is a consistent upward trend  3. Postsurgical hypothyroidism - latest thyroid labs reviewed with pt. >> normal, at goal: Lab Results  Component Value Date   TSH 0.43 05/04/2022  - she continues on LT4 137 mcg 6 out of 7 days and half a tablet 1 out of 7 days-average LT4 dose 127 mcg daily >> for the next refill, though, will need to switch to 125 mcg daily. - pt feels good on this dose. - we discussed about taking the thyroid hormone every day, with water, >30 minutes before breakfast,  separated by >4 hours from acid reflux medications, calcium, iron, multivitamins. Pt. is taking it  correctly.  [name], MD PhD [facility]

## 2022-09-10 ENCOUNTER — Other Ambulatory Visit: Payer: Self-pay | Admitting: Internal Medicine

## 2022-09-28 DIAGNOSIS — E113513 Type 2 diabetes mellitus with proliferative diabetic retinopathy with macular edema, bilateral: Secondary | ICD-10-CM | POA: Diagnosis not present

## 2022-10-10 ENCOUNTER — Encounter: Payer: Self-pay | Admitting: Obstetrics and Gynecology

## 2022-10-10 ENCOUNTER — Other Ambulatory Visit (HOSPITAL_COMMUNITY)
Admission: RE | Admit: 2022-10-10 | Discharge: 2022-10-10 | Disposition: A | Discharge status: Home / Self Care | Payer: Medicare HMO | Source: Ambulatory Visit | Attending: Obstetrics and Gynecology | Admitting: Obstetrics and Gynecology

## 2022-10-10 ENCOUNTER — Ambulatory Visit (INDEPENDENT_AMBULATORY_CARE_PROVIDER_SITE_OTHER): Payer: Medicare HMO | Admitting: Obstetrics and Gynecology

## 2022-10-10 VITALS — BP 144/80 | HR 87 | Ht 67.5 in | Wt 238.1 lb

## 2022-10-10 DIAGNOSIS — N8501 Benign endometrial hyperplasia: Secondary | ICD-10-CM

## 2022-10-10 DIAGNOSIS — N719 Inflammatory disease of uterus, unspecified: Secondary | ICD-10-CM | POA: Diagnosis not present

## 2022-10-10 DIAGNOSIS — N840 Polyp of corpus uteri: Secondary | ICD-10-CM | POA: Diagnosis not present

## 2022-10-10 LAB — POCT PREGNANCY, URINE: Preg Test, Ur: NEGATIVE

## 2022-10-10 NOTE — Progress Notes (Signed)
ENDOMETRIAL BIOPSY      Courtney [name] is a 67 y.o. G0P0000 here for endometrial biopsy.  The indications for endometrial biopsy were reviewed.  Pt had incidental finding of small foci of simple endometrial hyperplasia without atypia.  She has been on daily progesterone therapy as well.   Risks of the biopsy including cramping, bleeding, infection, uterine perforation, inadequate specimen and need for additional procedures were discussed. The patient states she understands and agrees to undergo procedure today. Consent was signed. Time out was performed.   Indications: simple endometrial hyperplasia without atypia Urine HCG: pt is postmenopausal  A bivalve speculum was placed into the vagina and the cervix was easily visualized and was prepped with Betadine x2. A single-toothed tenaculum was placed on the anterior lip of the cervix to stabilize it. The 3 mm pipelle was introduced into the endometrial cavity without difficulty to a depth of 7 cm, and a moderate amount of tissue was obtained and sent to pathology. This was repeated for a total of 2 passes. The instruments were removed from the patient's vagina. Minimal bleeding from the cervix at the tenaculum was noted.   The patient tolerated the procedure well. Routine post-procedure instructions were given to the patient.    Will base further management on results of biopsy. Pt will continue daily progesterone. Will discuss long term management of hyperplasia with gyn onc if biopsy is again negative.  Lynnda Shields, MD Faculty attending, [facility]

## 2022-10-12 DIAGNOSIS — E113513 Type 2 diabetes mellitus with proliferative diabetic retinopathy with macular edema, bilateral: Secondary | ICD-10-CM | POA: Diagnosis not present

## 2022-10-13 LAB — SURGICAL PATHOLOGY

## 2022-11-11 DIAGNOSIS — N8500 Endometrial hyperplasia, unspecified: Secondary | ICD-10-CM | POA: Diagnosis not present

## 2022-11-11 DIAGNOSIS — Z9181 History of falling: Secondary | ICD-10-CM | POA: Diagnosis not present

## 2022-11-11 DIAGNOSIS — N183 Chronic kidney disease, stage 3 unspecified: Secondary | ICD-10-CM | POA: Diagnosis not present

## 2022-11-11 DIAGNOSIS — I10 Essential (primary) hypertension: Secondary | ICD-10-CM | POA: Diagnosis not present

## 2022-11-11 DIAGNOSIS — Z139 Encounter for screening, unspecified: Secondary | ICD-10-CM | POA: Diagnosis not present

## 2022-11-11 DIAGNOSIS — E890 Postprocedural hypothyroidism: Secondary | ICD-10-CM | POA: Diagnosis not present

## 2022-11-11 DIAGNOSIS — M109 Gout, unspecified: Secondary | ICD-10-CM | POA: Diagnosis not present

## 2022-11-11 DIAGNOSIS — Z1331 Encounter for screening for depression: Secondary | ICD-10-CM | POA: Diagnosis not present

## 2022-11-11 DIAGNOSIS — E785 Hyperlipidemia, unspecified: Secondary | ICD-10-CM | POA: Diagnosis not present

## 2022-11-11 DIAGNOSIS — L918 Other hypertrophic disorders of the skin: Secondary | ICD-10-CM | POA: Diagnosis not present

## 2022-11-11 DIAGNOSIS — E1122 Type 2 diabetes mellitus with diabetic chronic kidney disease: Secondary | ICD-10-CM | POA: Diagnosis not present

## 2022-11-11 DIAGNOSIS — E1140 Type 2 diabetes mellitus with diabetic neuropathy, unspecified: Secondary | ICD-10-CM | POA: Diagnosis not present

## 2022-11-15 DIAGNOSIS — Z0100 Encounter for examination of eyes and vision without abnormal findings: Secondary | ICD-10-CM | POA: Diagnosis not present

## 2022-11-15 DIAGNOSIS — E119 Type 2 diabetes mellitus without complications: Secondary | ICD-10-CM | POA: Diagnosis not present

## 2022-11-15 DIAGNOSIS — Z794 Long term (current) use of insulin: Secondary | ICD-10-CM | POA: Diagnosis not present

## 2022-11-15 DIAGNOSIS — H5201 Hypermetropia, right eye: Secondary | ICD-10-CM | POA: Diagnosis not present

## 2022-12-01 DIAGNOSIS — E1165 Type 2 diabetes mellitus with hyperglycemia: Secondary | ICD-10-CM | POA: Diagnosis not present

## 2022-12-01 DIAGNOSIS — Z794 Long term (current) use of insulin: Secondary | ICD-10-CM | POA: Diagnosis not present

## 2022-12-01 DIAGNOSIS — E1142 Type 2 diabetes mellitus with diabetic polyneuropathy: Secondary | ICD-10-CM | POA: Diagnosis not present

## 2022-12-22 DIAGNOSIS — R809 Proteinuria, unspecified: Secondary | ICD-10-CM | POA: Diagnosis not present

## 2022-12-22 DIAGNOSIS — E1122 Type 2 diabetes mellitus with diabetic chronic kidney disease: Secondary | ICD-10-CM | POA: Diagnosis not present

## 2022-12-22 DIAGNOSIS — N2581 Secondary hyperparathyroidism of renal origin: Secondary | ICD-10-CM | POA: Diagnosis not present

## 2022-12-22 DIAGNOSIS — I129 Hypertensive chronic kidney disease with stage 1 through stage 4 chronic kidney disease, or unspecified chronic kidney disease: Secondary | ICD-10-CM | POA: Diagnosis not present

## 2022-12-22 DIAGNOSIS — N1832 Chronic kidney disease, stage 3b: Secondary | ICD-10-CM | POA: Diagnosis not present

## 2022-12-28 DIAGNOSIS — E113513 Type 2 diabetes mellitus with proliferative diabetic retinopathy with macular edema, bilateral: Secondary | ICD-10-CM | POA: Diagnosis not present

## 2023-01-16 ENCOUNTER — Ambulatory Visit (INDEPENDENT_AMBULATORY_CARE_PROVIDER_SITE_OTHER): Payer: Medicare HMO | Admitting: Internal Medicine

## 2023-01-16 ENCOUNTER — Encounter: Payer: Self-pay | Admitting: Internal Medicine

## 2023-01-16 VITALS — BP 130/68 | HR 86 | Ht 67.5 in | Wt 237.0 lb

## 2023-01-16 DIAGNOSIS — E1142 Type 2 diabetes mellitus with diabetic polyneuropathy: Secondary | ICD-10-CM

## 2023-01-16 DIAGNOSIS — E1165 Type 2 diabetes mellitus with hyperglycemia: Secondary | ICD-10-CM

## 2023-01-16 DIAGNOSIS — C73 Malignant neoplasm of thyroid gland: Secondary | ICD-10-CM

## 2023-01-16 DIAGNOSIS — Z7984 Long term (current) use of oral hypoglycemic drugs: Secondary | ICD-10-CM | POA: Diagnosis not present

## 2023-01-16 DIAGNOSIS — E7849 Other hyperlipidemia: Secondary | ICD-10-CM | POA: Diagnosis not present

## 2023-01-16 DIAGNOSIS — E890 Postprocedural hypothyroidism: Secondary | ICD-10-CM

## 2023-01-16 DIAGNOSIS — Z7985 Long-term (current) use of injectable non-insulin antidiabetic drugs: Secondary | ICD-10-CM | POA: Diagnosis not present

## 2023-01-16 DIAGNOSIS — E119 Type 2 diabetes mellitus without complications: Secondary | ICD-10-CM | POA: Insufficient documentation

## 2023-01-16 DIAGNOSIS — Z794 Long term (current) use of insulin: Secondary | ICD-10-CM | POA: Diagnosis not present

## 2023-01-16 LAB — POCT GLYCOSYLATED HEMOGLOBIN (HGB A1C): Hemoglobin A1C: 6.9 % — AB (ref 4.0–5.6)

## 2023-01-16 LAB — TSH: TSH: 0.47 u[IU]/mL (ref 0.35–5.50)

## 2023-01-16 LAB — T4, FREE: Free T4: 1.34 ng/dL (ref 0.60–1.60)

## 2023-01-16 MED ORDER — TRESIBA FLEXTOUCH 200 UNIT/ML ~~LOC~~ SOPN: PEN_INJECTOR | SUBCUTANEOUS | 3 refills | Status: DC

## 2023-01-16 NOTE — Patient Instructions (Addendum)
Please continue: - Jardiance 10 mg before b'fast - Regular insulin 20 units before breakfast and 20 units before dinner; add 5-10 units of regular insulin before a snack with carbs (crackers, popcorn) - Ozempic 1 mg weekly in a.m.   Please decrease: - Tresiba 60 units in am  Please continue levothyroxine 125 mcg daily.  Take the thyroid hormone every day, with water, at least 30 minutes before breakfast, separated by at least 4 hours from: - acid reflux medications - calcium - iron - multivitamins  Please stop at the lab.  Please return in 4 months.

## 2023-01-16 NOTE — Progress Notes (Unsigned)
Patient ID: Courtney [name], female   DOB: [birth year], 67 y.o.   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 Last visit 4 months ago.  Interim history: She has increased urination after she started [place] -now on Jardiance; no blurry vision, nausea, chest pain.   DM2: Reviewed HbA1c levels: Lab Results  Component Value Date   HGBA1C 6.8 (A) 09/09/2022   HGBA1C 7.1 (A) 05/04/2022   HGBA1C 7.8 (A) 08/26/2021   HGBA1C 7.0 (A) 04/26/2021   HGBA1C 6.9 (A) 12/23/2020   HGBA1C 6.8 (A) 08/19/2020   HGBA1C 7.8 (A) 05/19/2020   HGBA1C 10.7 (A) 02/13/2020   HGBA1C 12.6 (A) 11/13/2019   HGBA1C 9.8 (A) 05/13/2019   HGBA1C 13.7 (A) 10/23/2018   HGBA1C 10.1 (A) 06/25/2018   HGBA1C 8.6 (A) 02/14/2018   HGBA1C 8.3 11/13/2017   HGBA1C 9.1 08/16/2017   HGBA1C 11.3 05/31/2017   HGBA1C 11.9 02/24/2017   HGBA1C 9.5 11/25/2016   HGBA1C 8.2 (H) 04/13/2016   HGBA1C 9.1 02/05/2016   HGBA1C 9.8 (H) 09/18/2015   HGBA1C 9.9 06/19/2015   HGBA1C 11.0 03/27/2015   HGBA1C 10.9 (H) 12/26/2014  08/22/2016: HbA1c 11.8% 08/01/2014: HbA1c 14.5% 05/02/2014: HbA1c 14.8% 01/31/2014: HbA1c 13.6% 11/01/2013: HbA1c 15.1%  She is on: - Tresiba 70 units in a.m. - Novolin Regular insulin 30 units before breakfast  30 >> 25 units before dinner >> 20 units 2x a day, add 5-10 units of regular insulin before a snack with carbs (crackers, popcorn) - Ozempic 0.5 >> 1 mg weekly in a.m.  -  Jardiance 10 mg daily - started by Dr. Wynelle Link 01/2021 Stopped Metformin 500 mg 2x a day - b/c leg swelling. She was on Novolin 70/30 48 units 2x a day 15 min before the meals  She was on Amaryl 8 mg in am She was on Metformin >> stopped 2/2 CKD.  She checks sugars >4x a day:  Previously:  Lowest sugar was 63  >> 78 >> 75 >> 65 >> 93 >> 57; it is unclear at which level she has hypoglycemia Highest sugar was 325 >> .Marland Kitchen.169 >> 169 >> 253 >> 244.  Glucometer: Prodigy >> ReliOn  Pt's meals are: - Breakfast: chicken  - Lunch: peanuts, sandwich + crystal lite drink - Dinner: frozen dinner - Snacks: fruit, small bag potato chips, peanuts She is not having regular meals.  She continues to drink sweet tea.  -+ Stage III CKD: -reviewed nephrology labs: Component Ref Range & Units 12/22/2022  Glucose 65 - 99 mg/dL 409 High   Comment:                                                                                             Fasting reference interval  For someone without known diabetes, a glucose value      between 100 and 125 mg/dL is consistent with      prediabetes and should be confirmed with a      follow-up test.         BUN 7 - 25 mg/dL 20  Creatinine 1.61 - 0.96 mg/dL 0.45 High   eGFR CKD-EPI CR 2021 > OR = 60 mL/min/1.59m2 30 Low   BUN/Creatinine Ratio 6 - 22 (calc) 11  Sodium 135 - 146 mmol/L 145  Potassium 3.5 - 5.3 mmol/L 3.8  Chloride 98 - 110 mmol/L 108  Bicarbonate (CO2) 20 - 32 mmol/L 27  Calcium 8.6 - 10.4 mg/dL 9.3  Phosphorus 2.1 - 4.3 mg/dL 4.2  Albumin 3.6 - 5.1 g/dL 3.9   Component Ref Range & Units 12/22/2022  Creatinine, Ur 20 - 275 mg/dL 37  Urine Protein/Creatinine Ratio 24 - 184 mg/g creat 486 High   Protein/Creatinine Ratio, Urine 0.024 - 0.184 mg/mg creat 0.486 High   Protein Urine Random 5 - 24 mg/dL 18   Lab Results  Component Value Date   BUN 15 09/01/2021   Lab Results  Component Value Date   CREATININE 1.33 (H) 09/01/2021  04/20/2021: 18/1.65 04/27/2020: CMP normal with the exception of glucose 136, BUN/creatinine 22/2.18, GFR 27, calcium 8.6 (8.7-10.3), alkaline phosphatase 145 (48-121), ALT 33 (0-32), Vitamin D 23.5. In PCPs note, it was mentioned that she was lost for follow-up with  nephrology and she was referred back to [facility]. 10/28/2019: Glucose 96, BUN/creatinine 12/1.47, GFR 43 08/22/2016: BUN/creatinine 20/1.41 03/18/2016: BUN/creatinine 16/1.21, EGFR 57, Calcium 8.7, AST/ALT 17/24 09/18/2015: BUN/creatinine 14/1.2, GFR 57 (previously 55) 08/01/2014: 15/1.26, GFR 55 On Cozaar.  -+ HL; lipids: Lab Results  Component Value Date   CHOL 143 08/26/2021   HDL 77.60 08/26/2021   LDLCALC 51 08/26/2021   TRIG 70.0 08/26/2021   CHOLHDL 2 08/26/2021  04/27/2020: 130/121/69/40 10/28/2019: 166/88/91/6007/03/2016: Lipids: 156/85/70/69 09/18/2015: 160/57/87/62 08/01/2014: 145/78/74/55 On lovastatin 20.  - last eye exam was in 12/2022: + DR; she had laser surgeries and is back on intraocular injections for macular edema.  She had left eye cataract surgery.   PCP started Gabapentin 200 mg daily >> good results. - She has numbness and tingling in her right foot -sees Dr. Ether Griffins, last foot exam 02/03/2022, at her first visit with him.  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  - She did not have RAI treatment at that time, however, she was found to have a thyroid mass in 2005 >> This was biopsied and returned as normal thyroid tissue, but had RAI treatment at that time.  - The posttreatment whole-body scan was negative for any cancer spread.   Thyroglobulin returned at 2.3 in 08/2014 >> I ordered a neck ultrasound that showed a stable nodule with a fatty hilum, consistent with a benign lymph node, but no other masses in the surgical bed.   The thyroglobulin level remained detectable, and was actually 9.9 in 10/2015.   A neck ultrasound (11/2015) showed a stable 1.4 nodule in the thyroid bed, consistent with a benign process   Her thyroglobulin  levels continue to be detectable  A neck ultrasound (02/2020) was negative for metastasis or recurrences  A whole-body scan (03/2020) was also negative for  abnormal uptake  Reviewed her thyroglobulin and ATA antibodies: Lab Results  Component Value Date   THYROGLB 2.9 12/30/2021   THYROGLB 2.3 (L) 04/26/2021   THYROGLB 2.0 (L) 08/19/2020   THYROGLB 3.3 02/13/2020   THYROGLB 2.1 (L) 05/13/2019   THYROGLB 2.5 (L) 06/25/2018   THYROGLB 1.8 (L) 08/15/2017   THYROGLB 2.5 (L) 08/29/2016   THYROGLB 9.9 11/06/2015   THYROGLB 2.4 (L) 09/18/2015   THGAB <1 12/30/2021   THGAB <1 04/26/2021   THGAB <1 08/19/2020   THGAB <1 02/13/2020   THGAB <1 05/13/2019   THGAB <1 06/25/2018   THGAB <1 08/15/2017   THGAB <1 08/29/2016   THGAB <1 09/18/2015   THGAB <1.0 12/26/2014   Postsurgical hypothyroidism:  Reviewed her TFTs: Lab Results  Component Value Date   TSH 0.43 05/04/2022   TSH 0.16 (L) 12/30/2021   TSH 0.20 (L) 08/26/2021   TSH 0.27 (L) 04/26/2021   TSH 1.50 10/07/2020   FREET4 1.70 (H) 05/04/2022   FREET4 1.80 (H) 12/30/2021   FREET4 1.30 08/26/2021   FREET4 1.39 04/26/2021   FREET4 1.18 10/07/2020  10/28/2019: TSH 1.6 08/01/2014: TSH 1.89  Pt is on levothyroxine 125 mcg daily: - in am - fasting - at least 30 min from b'fast - no calcium - no iron - no multivitamins - no PPIs - not on Biotin  No FH of thyroid cancer. No h/o radiation tx to head or neck other than RAI treatment. No Biotin use. No recent steroids use.   Pt denies: - feeling nodules in neck - hoarseness - dysphagia - choking  She has a h/o irregular menses. She developed postmenopausal bleeding in 2019 >> she had D&C on 09/01/2021.  She is on vitamin D.  ROS: + see HPI  I reviewed pt's medications, allergies, PMH, social hx, family hx, and changes were documented in the history of present illness. Otherwise, unchanged from my initial visit note.  Past Medical History:  Diagnosis Date    Anemia associated with chronic renal failure    Arthritis    Chronic kidney disease (CKD), stage III (moderate) (HCC)    nephrologist-- dr s. Wynelle Link   Diabetic retinopathy of both eyes (HCC)    followed by dr c. haddad in [place];  pt stated gets eye injections every 6 months   History of COVID-19 2020   per pt mild to moderate symptoms , no hospital admission,  symptoms resolved with exceptin taste/ smell still not right   History of thyroid cancer 09/2002   endocrinologist--- dr Elvera Lennox;  s/p total thyroidectomy [id number] for multinodular goiter, dx follicular varient papillary thyroid cancer, no chemo/ radiation;   RAI for normal bx of mass in 2005;  no recurrence   Hx MRSA infection 06/2012   right thigh abscess with sepsis, hospital admission   Hyperlipidemia    Hypertension    Hypothyroidism, postsurgical 2004   endocrinologist-  dr Elvera Lennox;   10-03-2002  s/p total thyroidectomy for multinodular goiter/ thyroid cancer   Insulin dependent type 2 diabetes mellitus [facility])    endocrinologist--- dr Elvera Lennox;    (08-27-2021 per pt does not check blood sugar at home, only every once in a while)   Neuropathy due to secondary diabetes (HCC)    PMB (postmenopausal bleeding)    Secondary hyperparathyroidism of renal origin [facility])    Wears glasses    Past Surgical History:  Procedure Laterality Date   APPLICATION OF WOUND VAC     07-11-2012 and 07-14-2012 @[facility] ;  to right thigh wound with dressing change   BILATERAL CARPAL TUNNEL RELEASE Bilateral 2003   CATARACT EXTRACTION W/ INTRAOCULAR LENS IMPLANT Bilateral    left 2016;  right 2018   CHOLECYSTECTOMY OPEN     1990s   COLONOSCOPY WITH PROPOFOL N/A 05/14/2020   Procedure: COLONOSCOPY WITH PROPOFOL;  Surgeon: Toney Reil, MD;  Location: [facility] ENDOSCOPY;  Service: Gastroenterology;  Laterality: N/A;   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   HYSTEROSCOPY WITH D & C N/A 09/01/2021   Procedure: DILATATION AND CURETTAGE Manon Hilding;  Surgeon: Warden Fillers, MD;  Location: [facility];  Service: Gynecology;  Laterality: N/A;   INCISION AND DRAINAGE ABSCESS Right 07/08/2012   @[facility] ;  w/ excisional debridement right thigh   KNEE ARTHROSCOPY Left 05/05/2016   Procedure: ARTHROSCOPY KNEE;  Surgeon: Juanell Fairly, MD;  Location: [facility] ORS;  Service: Orthopedics;  Laterality: Left;   SHOULDER ARTHROSCOPY Left 03/2002   TOTAL THYROIDECTOMY Bilateral 10/03/2002   @[facility]    TRIGGER FINGER RELEASE     1990s ;   right hand   History   Social History Main Topics   Smoking status: Former Smoker   Smokeless tobacco: Not on file   Alcohol Use: No   Drug Use: No   Social History Narrative   Single   0 children   Sales executive (travels regularly)      Beer and wine on occasion   First menstrual cycle: 6th grade   Postmenopausal      Current Outpatient Medications  Medication Sig Dispense Refill   Acetaminophen (TYLENOL) 325 MG CAPS Take by mouth as needed.     allopurinol (ZYLOPRIM) 100 MG tablet Take 100 mg by mouth daily.     amLODipine (NORVASC) 10 MG tablet Take 10 mg by mouth daily.     Cholecalciferol 125 MCG (5000 UT) capsule Take 5,000 Units by mouth once a week. (Patient not taking: Reported on 03/28/2022)     Continuous Blood Gluc Receiver (FREESTYLE LIBRE 2 READER) DEVI 1 each by Does not apply route daily. 1 each 0   Continuous Blood Gluc Sensor (FREESTYLE LIBRE 2 SENSOR) MISC 1 each by Does not apply route every 14 (fourteen) days. E11.65 6 each 3   diphenhydrAMINE (BENADRYL) 50 MG tablet Take 50 mg by mouth as needed.     empagliflozin (JARDIANCE) 10 MG TABS tablet Take 10 mg by mouth daily.     insulin degludec (TRESIBA FLEXTOUCH) 200 UNIT/ML FlexTouch Pen Inject 70 units daily under skin 27 mL 3   Insulin Pen Needle 32G X 4 MM MISC Use 3x a day 300 each 3   insulin regular (NOVOLIN R) 100 units/mL injection Inject up to 60 units under skin as advised 60 mL 1   Insulin  Syringe-Needle U-100 31G X 15/64" 0.5 ML MISC Use 3x a day 300 each 3   levothyroxine (SYNTHROID) 125 MCG tablet Take 1 tablet (125 mcg total) by mouth daily before breakfast. 90 tablet 3   levothyroxine (SYNTHROID) 125 MCG tablet Take 1 tablet (125 mcg total) by mouth daily before breakfast. 90 tablet 3   lovastatin (MEVACOR) 20 MG tablet Take 20 mg by mouth daily.     medroxyPROGESTERone (PROVERA) 10 MG tablet Take 1 tablet (10 mg total) by mouth daily. 30 tablet 8   misoprostol (CYTOTEC) 100 MCG tablet 1/2 tab per vagina the night before procedure 1 tablet 0   Polyvinyl Alcohol-Povidone (REFRESH OP) Apply to eye as needed.  Semaglutide, 1 MG/DOSE, (OZEMPIC, 1 MG/DOSE,) 4 MG/3ML SOPN INJECT 1MG  SUBCUTANEOUSLY ONCE WEEKLY 9 mL 3   terconazole (TERAZOL 7) 0.4 % vaginal cream Place 1 applicator vaginally at bedtime. (Patient not taking: Reported on 03/28/2022) 45 g 0   torsemide (DEMADEX) 20 MG tablet Take 20 mg by mouth daily.     No current facility-administered medications for this visit.   NKDA   Family History  Problem Relation Age of Onset   Hypertension Mother    Thyroid disease Mother    CVA Mother    Hypertension Sister    Thyroid disease Sister    Hypertension Brother    Hyperlipidemia Brother    PE:  BP 130/68 (BP Location: Right Arm, Patient Position: Sitting, Cuff Size: Normal)   Pulse 86   Ht 5' 7.5" (1.715 m)   Wt 237 lb (107.5 kg)   SpO2 99%   BMI 36.57 kg/m  Wt Readings from Last 3 Encounters:  01/16/23 237 lb (107.5 kg)  10/10/22 238 lb 1.6 oz (108 kg)  09/09/22 235 lb 9.6 oz (106.9 kg)   Constitutional: overweight, in NAD Eyes: no exophthalmos ENT: no masses palpated in neck, no cervical lymphadenopathy Cardiovascular: RRR, No MRG Respiratory: CTA B Musculoskeletal: no deformities Skin:  no rashes Neurological: no tremor with outstretched hands  ASSESSMENT: Diabetic Foot Exam - Simple   Simple Foot Form Diabetic Foot exam was performed with the  following findings: Yes 01/16/2023 11:10 AM  Visual Inspection No deformities, no ulcerations, no other skin breakdown bilaterally: Yes Sensation Testing Intact to touch and monofilament testing bilaterally: Yes Pulse Check Posterior Tibialis and Dorsalis pulse intact bilaterally: Yes Comments     1. DM2, insulin-dependent, uncontrolled, with complications - CKD - DR  She does not have a family history of medullary thyroid cancer a personal history of pancreatitis.  2. 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 She did not have RAI treatment at that time, however, she was found to have a thyroid mass in 2005. This was biopsied and returned as normal thyroid tissue.She had RAI treatment at that time. The posttreatment whole-body scan was negative for any cancers.  - Neck U/S (09/19/2014): 1. Post total thyroidectomy. 2. No change in the previously biopsied approximately 1.4 cm echogenic solid nodule within the inferior aspect of the right lobe of the thyroid, grossly unchanged since the 2005 examination. 3. Apparent development of an approximately 0.4 cm hypoechoic nodule within the right thyroidectomy bed - while too small for definitive characterization, this nodule appears to contain an echogenic hilum and thus is favored to represent a non pathologically enlarged cervical lymph node.  - Neck U/S (11/27/2015):  1. Surgical changes of prior total thyroidectomy. 2. Unchanged 1.4 cm echogenic soft tissue nodule in the right thyroid resection bed. Continued stability over time consistent with a benign process.  - Neck U/S (03/02/2020): Stable surgical changes following total thyroidectomy. No residual thyroid bed abnormality.  - WBS (03/27/2020): her copay: 6800$!!  No evidence of local thyroid cancer recurrence or  distant metastasis by I 131 imaging.   3. Post surgical hypothyroidism  PLAN:  1. Patient with longstanding, uncontrolled, type 2 diabetes, on basal/bolus insulin regimen and also SGLT2 and GLP-1 receptor agonist, with improved control after changing from [place] to [place] due to an insurance change.  At last visit, HbA1c was lower, at 6.8%.  She is still using her regular insulin due to price.  Will try to switch to ReliOn Flex pens from [employer] but these were too expensive. -At last visit, sugars appears to be mostly fluctuating within the target range with values at goal overnight and increasing after breakfast, after which sugars remain higher in the target range.  She was having dietary indiscretions and not usually covering her snacks with insulin.  We discussed about the carbs and snacks and how they increase her blood sugars.  I strongly advised her to stop sweet drinks and if she did have a snack with carbs to cover it with the right amount of regular insulin.  Otherwise, we did not change her regimen.  I also recommended alpha lipoic acid for neuropathic symptoms. CGM interpretation: -At today's visit, we reviewed her CGM downloads: It appears that 88% of values are in target range (goal >70%), while 8% are higher than 180 (goal <25%), and 4% are lower than 70 (goal <4%).  The calculated average blood sugar is 120.  The projected HbA1c for the next 3 months (GMI) is 6.2%. -Reviewing the CGM trends, sugars appear to be fluctuating within the target range with occasional hyperglycemic values after meals but also occasional lows overnight and before meals.  This morning, her blood sugars were in the 50s.  Will go ahead and reduce her Tresiba dose and will continue the regular insulin doses, but I did advise her to vary the dose of regular insulin based on the size and consistency with her meals, she units up or down starting at 20 units. - I advised her to: Patient Instructions  Please continue: -  Jardiance 10 mg before b'fast - Regular insulin 20 units before breakfast and 20 units before dinner; add 5-10 units of regular insulin before a snack with carbs (crackers, popcorn) - Ozempic 1 mg weekly in a.m.   Please decrease: - Tresiba 60 units in am  Please continue levothyroxine 125 mcg daily.  Take the thyroid hormone every day, with water, at least 30 minutes before breakfast, separated by at least 4 hours from: - acid reflux medications - calcium - iron - multivitamins  Please stop at the lab.  Please return in 4 months.  - we checked her HbA1c: 6.9% (slightly higher) - advised to check sugars at different times of the day - 4x a day, rotating check times - advised for yearly eye exams >> she is UTD - she was recently started by gabapentin by PCP-this helps  - return to clinic in 4 months       2. 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  She has persistently detectable thyroglobulin. -Reviewed the results of her neck ultrasound from 11/2015, which showed a stable mass, consistent with a benign process.  Repeat neck ultrasound in 02/2020, showed stable surgical changes with no residual thyroid abnormalities.  A whole-body scan in 03/2020 showed no evidence of local recurrence or metastasis.  This was a very expensive test for her and she had to pay approximately $7000 for it.  -She has no neck compression symptoms -Her thyroglobulin levels remain detectable, with the latest being slightly higher, at 2.9.  At that time, and at subsequent visits, we discussed that she may need a PET scan or at least a CT scan.  I initially ordered a CT scan but she did not have it yet as she felt that her insurance  would no cover it -At today's visit we will recheck her thyroglobulin to see if there is a consistent upward trend  3. Postsurgical hypothyroidism - latest thyroid labs  reviewed with pt. >> normal: Lab Results  Component Value Date   TSH 0.43 05/04/2022  - she continues on LT4 125 mcg daily - pt feels good on this dose. - we discussed about taking the thyroid hormone every day, with water, >30 minutes before breakfast, separated by >4 hours from acid reflux medications, calcium, iron, multivitamins. Pt. is taking it correctly. - will check thyroid tests today: TSH and fT4 - If labs are abnormal, she will need to return for repeat TFTs in 1.5 months  Component     Latest Ref Rng 01/16/2023  Hemoglobin A1C     4.0 - 5.6 % 6.9 !   TSH     0.35 - 5.50 uIU/mL 0.47   T4,Free(Direct)     0.60 - 1.60 ng/dL 1.61   Thyroglobulin     ng/mL 3.1   Comment --   Thyroglobulin Ab     < or = 1 IU/mL <1   Labs are at goal and thyroglobulin level is detectable, but this has been essentially stable since 2017 (with very slight increase across the last 4 measurements).  I plan to recheck this at next visit.  Carlus Pavlov, MD PhD [facility]

## 2023-01-18 LAB — THYROGLOBULIN ANTIBODY: Thyroglobulin Ab: <1 IU/mL (ref <=1)

## 2023-01-18 LAB — THYROGLOBULIN LEVEL: Thyroglobulin: 3.1 ng/mL

## 2023-01-20 DIAGNOSIS — N760 Acute vaginitis: Secondary | ICD-10-CM | POA: Diagnosis not present

## 2023-01-20 DIAGNOSIS — N898 Other specified noninflammatory disorders of vagina: Secondary | ICD-10-CM | POA: Diagnosis not present

## 2023-01-20 DIAGNOSIS — B9689 Other specified bacterial agents as the cause of diseases classified elsewhere: Secondary | ICD-10-CM | POA: Diagnosis not present

## 2023-02-03 DIAGNOSIS — E113513 Type 2 diabetes mellitus with proliferative diabetic retinopathy with macular edema, bilateral: Secondary | ICD-10-CM | POA: Diagnosis not present

## 2023-02-23 DIAGNOSIS — N760 Acute vaginitis: Secondary | ICD-10-CM | POA: Diagnosis not present

## 2023-02-23 DIAGNOSIS — E1165 Type 2 diabetes mellitus with hyperglycemia: Secondary | ICD-10-CM | POA: Diagnosis not present

## 2023-02-23 DIAGNOSIS — N939 Abnormal uterine and vaginal bleeding, unspecified: Secondary | ICD-10-CM | POA: Diagnosis not present

## 2023-02-23 DIAGNOSIS — Z794 Long term (current) use of insulin: Secondary | ICD-10-CM | POA: Diagnosis not present

## 2023-02-23 DIAGNOSIS — E1142 Type 2 diabetes mellitus with diabetic polyneuropathy: Secondary | ICD-10-CM | POA: Diagnosis not present

## 2023-03-05 ENCOUNTER — Other Ambulatory Visit: Payer: Self-pay | Admitting: Internal Medicine

## 2023-03-08 ENCOUNTER — Other Ambulatory Visit: Payer: Self-pay

## 2023-03-08 MED ORDER — INSULIN PEN NEEDLE 32G X 4 MM MISC: 3 refills | Status: DC

## 2023-03-13 DIAGNOSIS — E113513 Type 2 diabetes mellitus with proliferative diabetic retinopathy with macular edema, bilateral: Secondary | ICD-10-CM | POA: Diagnosis not present

## 2023-03-26 ENCOUNTER — Other Ambulatory Visit: Payer: Self-pay | Admitting: Obstetrics and Gynecology

## 2023-03-26 DIAGNOSIS — N8501 Benign endometrial hyperplasia: Secondary | ICD-10-CM

## 2023-03-27 ENCOUNTER — Ambulatory Visit (INDEPENDENT_AMBULATORY_CARE_PROVIDER_SITE_OTHER): Payer: Medicare HMO | Admitting: Obstetrics and Gynecology

## 2023-03-27 ENCOUNTER — Encounter: Payer: Self-pay | Admitting: Obstetrics and Gynecology

## 2023-03-27 ENCOUNTER — Other Ambulatory Visit (HOSPITAL_COMMUNITY)
Admission: RE | Admit: 2023-03-27 | Discharge: 2023-03-27 | Disposition: A | Discharge status: Home / Self Care | Payer: Medicare HMO | Source: Ambulatory Visit | Attending: Obstetrics and Gynecology | Admitting: Obstetrics and Gynecology

## 2023-03-27 VITALS — BP 148/69 | HR 80 | Ht 67.5 in | Wt 222.0 lb

## 2023-03-27 DIAGNOSIS — N8501 Benign endometrial hyperplasia: Secondary | ICD-10-CM | POA: Diagnosis not present

## 2023-03-27 DIAGNOSIS — N898 Other specified noninflammatory disorders of vagina: Secondary | ICD-10-CM | POA: Diagnosis not present

## 2023-03-27 MED ORDER — MEDROXYPROGESTERONE ACETATE 10 MG PO TABS: 10.0000 mg | ORAL_TABLET | Freq: Every day | ORAL | 8 refills | Status: DC

## 2023-03-27 NOTE — Progress Notes (Signed)
  CC: endometrial hyperplasia Subjective:    Patient ID: Courtney [name], female    DOB: [birth year], 67 y.o.   MRN: [id number]  HPI Pt  seen for follow up on simple endometrial hyperplasia.  Pt has continued with daily provera.  Pt has no major risk factors for endometrial cancer other than type 2 diabetes.  Discussed converting to progesterone IUD for direct coverage of endometrium, but pt declines.  No recurrence of post menopausal bleeding. Pt also c/o external vaginal irritation.  She noted recent BV diagnosis which was treated.   Review of Systems     Objective:   Physical Exam Vitals:   03/27/23 1114 03/27/23 1120  BP: (!) 144/69 (!) 148/69  Pulse: 80    SVE: vaginal atrophy noted, cvx WNL. Labia majora dry and mildly irritated      Assessment & Plan:   1. Simple endometrial hyperplasia Continue daily provera, will convert to yearly endometrial biopsy for at least 2 years  - medroxyPROGESTERone (PROVERA) 10 MG tablet; Take 1 tablet (10 mg total) by mouth daily.  Dispense: 30 tablet; Refill: 8  2. Vaginal discharge Vaginal swab taken today - Cervicovaginal ancillary only  3. Vaginal itching Consider A and D ointment for external genitalia. If ineffective may consider topical estrogen cream, but would like to avoid due to hx of hyperplasia  - Cervicovaginal ancillary only F/u in six months for annual exam and endometrial biopsy.  No pap at next visit due to age.   Warden Fillers, MD Faculty Attending, [facility]

## 2023-03-29 LAB — CERVICOVAGINAL ANCILLARY ONLY
Bacterial Vaginitis (gardnerella): NEGATIVE
Candida Glabrata: POSITIVE — AB
Candida Vaginitis: POSITIVE — AB
Comment: NEGATIVE
Comment: NEGATIVE
Comment: NEGATIVE
Comment: NEGATIVE
Trichomonas: NEGATIVE

## 2023-04-05 ENCOUNTER — Encounter: Payer: Self-pay | Admitting: General Practice

## 2023-04-27 DIAGNOSIS — E113513 Type 2 diabetes mellitus with proliferative diabetic retinopathy with macular edema, bilateral: Secondary | ICD-10-CM | POA: Diagnosis not present

## 2023-05-16 ENCOUNTER — Other Ambulatory Visit: Payer: Self-pay | Admitting: Physician Assistant

## 2023-05-16 DIAGNOSIS — Z1231 Encounter for screening mammogram for malignant neoplasm of breast: Secondary | ICD-10-CM

## 2023-05-17 DIAGNOSIS — E785 Hyperlipidemia, unspecified: Secondary | ICD-10-CM | POA: Diagnosis not present

## 2023-05-17 DIAGNOSIS — N183 Chronic kidney disease, stage 3 unspecified: Secondary | ICD-10-CM | POA: Diagnosis not present

## 2023-05-17 DIAGNOSIS — M109 Gout, unspecified: Secondary | ICD-10-CM | POA: Diagnosis not present

## 2023-05-17 DIAGNOSIS — M5416 Radiculopathy, lumbar region: Secondary | ICD-10-CM | POA: Diagnosis not present

## 2023-05-17 DIAGNOSIS — E1122 Type 2 diabetes mellitus with diabetic chronic kidney disease: Secondary | ICD-10-CM | POA: Diagnosis not present

## 2023-05-17 DIAGNOSIS — E1140 Type 2 diabetes mellitus with diabetic neuropathy, unspecified: Secondary | ICD-10-CM | POA: Diagnosis not present

## 2023-05-17 DIAGNOSIS — I10 Essential (primary) hypertension: Secondary | ICD-10-CM | POA: Diagnosis not present

## 2023-05-17 DIAGNOSIS — E890 Postprocedural hypothyroidism: Secondary | ICD-10-CM | POA: Diagnosis not present

## 2023-05-17 DIAGNOSIS — N8500 Endometrial hyperplasia, unspecified: Secondary | ICD-10-CM | POA: Diagnosis not present

## 2023-05-22 ENCOUNTER — Ambulatory Visit: Payer: Medicare HMO | Admitting: Internal Medicine

## 2023-05-22 ENCOUNTER — Encounter: Payer: Self-pay | Admitting: Internal Medicine

## 2023-05-22 VITALS — BP 138/70 | HR 85 | Ht 67.5 in | Wt 238.4 lb

## 2023-05-22 DIAGNOSIS — E7849 Other hyperlipidemia: Secondary | ICD-10-CM | POA: Diagnosis not present

## 2023-05-22 DIAGNOSIS — E1165 Type 2 diabetes mellitus with hyperglycemia: Secondary | ICD-10-CM

## 2023-05-22 DIAGNOSIS — E1142 Type 2 diabetes mellitus with diabetic polyneuropathy: Secondary | ICD-10-CM

## 2023-05-22 DIAGNOSIS — Z7985 Long-term (current) use of injectable non-insulin antidiabetic drugs: Secondary | ICD-10-CM

## 2023-05-22 DIAGNOSIS — C73 Malignant neoplasm of thyroid gland: Secondary | ICD-10-CM | POA: Diagnosis not present

## 2023-05-22 DIAGNOSIS — Z7984 Long term (current) use of oral hypoglycemic drugs: Secondary | ICD-10-CM

## 2023-05-22 DIAGNOSIS — E890 Postprocedural hypothyroidism: Secondary | ICD-10-CM | POA: Diagnosis not present

## 2023-05-22 DIAGNOSIS — Z794 Long term (current) use of insulin: Secondary | ICD-10-CM | POA: Diagnosis not present

## 2023-05-22 LAB — POCT GLYCOSYLATED HEMOGLOBIN (HGB A1C): Hemoglobin A1C: 7.1 % — AB (ref 4.0–5.6)

## 2023-05-22 LAB — TSH: TSH: 0.27 u[IU]/mL — ABNORMAL LOW (ref 0.35–5.50)

## 2023-05-22 MED ORDER — FIASP FLEXTOUCH 100 UNIT/ML ~~LOC~~ SOPN: 10.0000 [IU] | PEN_INJECTOR | Freq: Every day | SUBCUTANEOUS | 3 refills | Status: DC

## 2023-05-22 MED ORDER — INSULIN PEN NEEDLE 32G X 4 MM MISC: 3 refills | Status: DC

## 2023-05-22 NOTE — Patient Instructions (Addendum)
Please continue: - Jardiance 10 mg before b'fast - Ozempic 1 mg weekly in a.m.   Change: - FiAsp 10-15 units before b''fast - Tresiba 50 units in am  Please continue levothyroxine 125 mcg daily.  Take the thyroid hormone every day, with water, at least 30 minutes before breakfast, separated by at least 4 hours from: - acid reflux medications - calcium - iron - multivitamins  Please stop at the lab.  Please return in 4 months.

## 2023-05-22 NOTE — Progress Notes (Unsigned)
Patient ID: VALLA SUI, female   DOB: [birth year], 67 y.o.   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 Last visit 4 months ago.  Interim history: No blurry vision, nausea, chest pain.  She had a UTI and also a yeast inf. >> saw ObGyn - took a Boric acid vaginal suppository for 2 weeks. She also had vaginal itching >> uses a wash >> improved.   DM2: Reviewed HbA1c levels: Lab Results  Component Value Date   HGBA1C 6.9 (A) 01/16/2023   HGBA1C 6.8 (A) 09/09/2022   HGBA1C 7.1 (A) 05/04/2022   HGBA1C 7.8 (A) 08/26/2021   HGBA1C 7.0 (A) 04/26/2021   HGBA1C 6.9 (A) 12/23/2020   HGBA1C 6.8 (A) 08/19/2020   HGBA1C 7.8 (A) 05/19/2020   HGBA1C 10.7 (A) 02/13/2020   HGBA1C 12.6 (A) 11/13/2019   HGBA1C 9.8 (A) 05/13/2019   HGBA1C 13.7 (A) 10/23/2018   HGBA1C 10.1 (A) 06/25/2018   HGBA1C 8.6 (A) 02/14/2018   HGBA1C 8.3 11/13/2017   HGBA1C 9.1 08/16/2017   HGBA1C 11.3 05/31/2017   HGBA1C 11.9 02/24/2017   HGBA1C 9.5 11/25/2016   HGBA1C 8.2 (H) 04/13/2016   HGBA1C 9.1 02/05/2016   HGBA1C 9.8 (H) 09/18/2015   HGBA1C 9.9 06/19/2015   HGBA1C 11.0 03/27/2015   HGBA1C 10.9 (H) 12/26/2014  08/22/2016: HbA1c 11.8% 08/01/2014: HbA1c 14.5% 05/02/2014: HbA1c 14.8% 01/31/2014: HbA1c 13.6% 11/01/2013: HbA1c 15.1%  She is on: - Tresiba 70 >> 60 units in a.m. - Novolin Regular insulin 30 units before breakfast  30 >> 25 units before dinner >> 20 units 2x a day, add 5-10 units of regular insulin before a snack with carbs (crackers, popcorn) >> now 15-18 units before b'fast - Ozempic 0.5 >> 1 mg weekly in a.m.  - Jardiance 10 mg daily - started by Dr. Wynelle Link 01/2021 Stopped Metformin 500 mg 2x a day - b/c leg swelling. She was on Novolin 70/30 48 units 2x a day 15 min before  the meals  She was on Amaryl 8 mg in am She was on Metformin >> stopped 2/2 CKD. She was previously on [place]o. She was previously on [place].  She checks sugars >4x a day with the Libre CGM (from [place]): In September: - in am: 86-118 before    Previously:  Previously:  Lowest sugar was 93 >> 57 >> 50s; it is unclear at which level she has hypoglycemia Highest sugar was 325 >> ... 253 >> 244 >> 200.  Glucometer: Prodigy >> ReliOn  Pt's meals are: - Breakfast: chicken  - Lunch: peanuts, sandwich + crystal lite drink - Dinner: frozen dinner - Snacks: fruit, small bag potato chips, peanuts She is not having regular meals.  She continues to drink sweet tea.  -+ Stage III CKD: -reviewed nephrology labs: Component Ref Range & Units 12/22/2022  Glucose 65 - 99 mg/dL 409 High   Comment:  Fasting reference interval                                               For someone without known diabetes, a glucose value      between 100 and 125 mg/dL is consistent with      prediabetes and should be confirmed with a      follow-up test.         BUN 7 - 25 mg/dL 20  Creatinine 1.61 - 0.96 mg/dL 0.45 High   eGFR CKD-EPI CR 2021 > OR = 60 mL/min/1.2m2 30 Low   BUN/Creatinine Ratio 6 - 22 (calc) 11  Sodium 135 - 146 mmol/L 145  Potassium 3.5 - 5.3 mmol/L 3.8  Chloride 98 - 110 mmol/L 108  Bicarbonate (CO2) 20 - 32 mmol/L 27  Calcium 8.6 - 10.4 mg/dL 9.3  Phosphorus 2.1 - 4.3 mg/dL 4.2  Albumin 3.6 - 5.1 g/dL 3.9   Component Ref Range & Units 12/22/2022  Creatinine, Ur 20 - 275 mg/dL 37  Urine Protein/Creatinine Ratio 24 - 184 mg/g creat 486 High   Protein/Creatinine Ratio, Urine 0.024 - 0.184 mg/mg creat 0.486 High   Protein Urine Random 5 - 24 mg/dL 18   Lab Results  Component Value Date   BUN 15 09/01/2021   Lab Results  Component Value Date   CREATININE 1.33 (H)  09/01/2021  04/20/2021: 18/1.65 04/27/2020: CMP normal with the exception of glucose 136, BUN/creatinine 22/2.18, GFR 27, calcium 8.6 (8.7-10.3), alkaline phosphatase 145 (48-121), ALT 33 (0-32), Vitamin D 23.5. In PCPs note, it was mentioned that she was lost for follow-up with nephrology and she was referred back to [facility]. 10/28/2019: Glucose 96, BUN/creatinine 12/1.47, GFR 43 08/22/2016: BUN/creatinine 20/1.41 03/18/2016: BUN/creatinine 16/1.21, EGFR 57, Calcium 8.7, AST/ALT 17/24 09/18/2015: BUN/creatinine 14/1.2, GFR 57 (previously 55) 08/01/2014: 15/1.26, GFR 55 On Cozaar.  -+ HL; lipids: Lab Results  Component Value Date   CHOL 143 08/26/2021   HDL 77.60 08/26/2021   LDLCALC 51 08/26/2021   TRIG 70.0 08/26/2021   CHOLHDL 2 08/26/2021  04/27/2020: 130/121/69/40 10/28/2019: 166/88/91/6007/03/2016: Lipids: 156/85/70/69 09/18/2015: 160/57/87/62 08/01/2014: 145/78/74/55 On lovastatin 20.  - last eye exam was in 12/2022: + DR; she had laser surgeries and is back on intraocular injections for macular edema.  She had left eye cataract surgery.   PCP started Gabapentin >> good results. - She has numbness and tingling in her right foot -sees Dr. Ether Griffins.  I performed a foot exam here in the clinic 01/16/2023.  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  - She did not have RAI treatment at that time, however, she was found to have a thyroid mass in 2005 >> This was biopsied and returned as normal thyroid tissue, but had RAI treatment at that time.  - The posttreatment whole-body scan was negative for any cancer spread.   Thyroglobulin returned at 2.3 in 08/2014 >> I ordered a neck ultrasound that showed a stable nodule with a fatty hilum, consistent with a benign  lymph node, but no other masses in the surgical bed.   The thyroglobulin level remained detectable, and was actually 9.9 in 10/2015.  A neck ultrasound (11/2015) showed a stable 1.4 nodule in the thyroid bed, consistent with a benign process   Her thyroglobulin levels continue to be detectable  A neck ultrasound (02/2020) was negative for metastasis or recurrences  A whole-body scan (03/2020) was also negative for abnormal uptake  Reviewed her thyroglobulin and ATA antibodies: Lab Results  Component Value Date   THYROGLB 3.1 01/16/2023   THYROGLB 2.9 12/30/2021   THYROGLB 2.3 (L) 04/26/2021   THYROGLB 2.0 (L) 08/19/2020   THYROGLB 3.3 02/13/2020   THYROGLB 2.1 (L) 05/13/2019   THYROGLB 2.5 (L) 06/25/2018   THYROGLB 1.8 (L) 08/15/2017   THYROGLB 2.5 (L) 08/29/2016   THYROGLB 9.9 11/06/2015   THGAB <1 01/16/2023   THGAB <1 12/30/2021   THGAB <1 04/26/2021   THGAB <1 08/19/2020   THGAB <1 02/13/2020   THGAB <1 05/13/2019   THGAB <1 06/25/2018   THGAB <1 08/15/2017   THGAB <1 08/29/2016   THGAB <1 09/18/2015   Postsurgical hypothyroidism:  Reviewed her TFTs: Lab Results  Component Value Date   TSH 0.47 01/16/2023   TSH 0.43 05/04/2022   TSH 0.16 (L) 12/30/2021   TSH 0.20 (L) 08/26/2021   TSH 0.27 (L) 04/26/2021   FREET4 1.34 01/16/2023   FREET4 1.70 (H) 05/04/2022   FREET4 1.80 (H) 12/30/2021   FREET4 1.30 08/26/2021   FREET4 1.39 04/26/2021  10/28/2019: TSH 1.6 08/01/2014: TSH 1.89  Pt is on levothyroxine 125 mcg daily: - in am - fasting - at least 30 min from b'fast - no calcium - no iron - no multivitamins - no PPIs - not on Biotin  No FH of thyroid cancer. No h/o radiation tx to head or neck other than RAI treatment. No Biotin use. No recent steroids use.   Pt denies: - feeling nodules in neck - hoarseness - dysphagia - choking  She has a h/o irregular menses. She developed postmenopausal bleeding in 2019 >> she had D&C on 09/01/2021.  She is  on vitamin D once a week.  ROS: + see HPI  I reviewed pt's medications, allergies, PMH, social hx, family hx, and changes were documented in the history of present illness. Otherwise, unchanged from my initial visit note.  Past Medical History:  Diagnosis Date   Anemia associated with chronic renal failure    Arthritis    Chronic kidney disease (CKD), stage III (moderate) (HCC)    nephrologist-- dr s. Wynelle Link   Diabetic retinopathy of both eyes (HCC)    followed by dr c. haddad in [place];  pt stated gets eye injections every 6 months   History of COVID-19 2020   per pt mild to moderate symptoms , no hospital admission,  symptoms resolved with exceptin taste/ smell still not right   History of thyroid cancer 09/2002   endocrinologist--- dr Elvera Lennox;  s/p total thyroidectomy [id number] for multinodular goiter, dx follicular varient papillary thyroid cancer, no chemo/ radiation;   RAI for normal bx of mass in 2005;  no recurrence   Hx MRSA infection 06/2012   right thigh abscess with sepsis, hospital admission   Hyperlipidemia    Hypertension    Hypothyroidism, postsurgical 2004   endocrinologist-  dr Elvera Lennox;   10-03-2002  s/p total thyroidectomy for multinodular goiter/ thyroid cancer   Insulin dependent type 2 diabetes mellitus [facility])    endocrinologist--- dr Elvera Lennox;    (08-27-2021 per pt does not check blood sugar at home, only every once in a while)   Neuropathy due  to secondary diabetes (HCC)    PMB (postmenopausal bleeding)    Secondary hyperparathyroidism of renal origin [facility])    Wears glasses    Past Surgical History:  Procedure Laterality Date   APPLICATION OF WOUND VAC     07-11-2012 and 07-14-2012 @[facility] ;   to right thigh wound with dressing change   BILATERAL CARPAL TUNNEL RELEASE Bilateral 2003   CATARACT EXTRACTION W/ INTRAOCULAR LENS IMPLANT Bilateral    left 2016;  right 2018   CHOLECYSTECTOMY OPEN     1990s   COLONOSCOPY WITH PROPOFOL N/A 05/14/2020    Procedure: COLONOSCOPY WITH PROPOFOL;  Surgeon: Toney Reil, MD;  Location: [facility] ENDOSCOPY;  Service: Gastroenterology;  Laterality: N/A;   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   HYSTEROSCOPY WITH D & C N/A 09/01/2021   Procedure: DILATATION AND CURETTAGE Manon Hilding;  Surgeon: Warden Fillers, MD;  Location[facility] ;  Service: Gynecology;  Laterality: N/A;   INCISION AND DRAINAGE ABSCESS Right 07/08/2012   @[facility] ;  w/ excisional debridement right thigh   KNEE ARTHROSCOPY Left 05/05/2016   Procedure: ARTHROSCOPY KNEE;  Surgeon: Juanell Fairly, MD;  Location: [facility] ORS;  Service: Orthopedics;  Laterality: Left;   SHOULDER ARTHROSCOPY Left 03/2002   TOTAL THYROIDECTOMY Bilateral 10/03/2002   @[facility]    TRIGGER FINGER RELEASE     1990s ;   right hand   History   Social History Main Topics   Smoking status: Former Smoker   Smokeless tobacco: Not on file   Alcohol Use: No   Drug Use: No   Social History Narrative   Single   0 children   Sales executive (travels regularly)      Beer and wine on occasion   First menstrual cycle: 6th grade   Postmenopausal      Current Outpatient Medications  Medication Sig Dispense Refill   Acetaminophen (TYLENOL) 325 MG CAPS Take by mouth as needed.     allopurinol (ZYLOPRIM) 100 MG tablet Take 100 mg by mouth daily.     amLODipine (NORVASC) 10 MG tablet Take 10 mg by mouth daily.     Cholecalciferol 125 MCG (5000 UT) capsule Take 5,000 Units by mouth once a week.     Continuous Blood Gluc Receiver (FREESTYLE LIBRE 2 READER) DEVI 1 each by Does not apply route daily. 1 each 0   Continuous Blood Gluc Sensor (FREESTYLE LIBRE 2 SENSOR) MISC 1 each by Does not apply route every 14 (fourteen) days. E11.65 6 each 3   diphenhydrAMINE (BENADRYL) 50 MG tablet Take 50 mg by mouth as needed.     empagliflozin (JARDIANCE) 10 MG TABS tablet Take 10 mg by mouth daily.     gabapentin (NEURONTIN) 100 MG capsule TAKE 1 TO 3 CAPSULES  BY MOUTH NIGHTLY FOR NEUROPATHY PAIN     insulin degludec (TRESIBA FLEXTOUCH) 200 UNIT/ML FlexTouch Pen Inject 60-70 units daily under skin 27 mL 3   Insulin Pen Needle 32G X 4 MM MISC Use 3x a day 300 each 3   Insulin Pen Needle 32G X 4 MM MISC Use 3x a day 100 each 3   insulin regular (NOVOLIN R) 100 units/mL injection Inject up to 60 units under skin as advised 60 mL 1   Insulin Syringe-Needle U-100 31G X 15/64" 0.5 ML MISC Use 3x a day 300 each 3   levothyroxine (SYNTHROID) 125 MCG tablet Take 1 tablet (125 mcg total) by mouth daily before breakfast. 90 tablet 3   lovastatin (  MEVACOR) 20 MG tablet Take 20 mg by mouth daily.     medroxyPROGESTERone (PROVERA) 10 MG tablet Take 1 tablet (10 mg total) by mouth daily. 30 tablet 8   OZEMPIC, 1 MG/DOSE, 4 MG/3ML SOPN INJECT 1MG  SUBCUTANEOUSLY ONCE WEEKLY 9 mL 0   Polyvinyl Alcohol-Povidone (REFRESH OP) Apply to eye as needed.     terconazole (TERAZOL 7) 0.4 % vaginal cream Place 1 applicator vaginally at bedtime. 45 g 0   torsemide (DEMADEX) 20 MG tablet Take 20 mg by mouth daily.     No current facility-administered medications for this visit.   NKDA   Family History  Problem Relation Age of Onset   Hypertension Mother    Thyroid disease Mother    CVA Mother    Hypertension Sister    Thyroid disease Sister    Hypertension Brother    Hyperlipidemia Brother    PE:  BP 138/70   Pulse 85   Ht 5' 7.5" (1.715 m)   Wt 238 lb 6.4 oz (108.1 kg)   SpO2 98%   BMI 36.79 kg/m  Wt Readings from Last 3 Encounters:  05/22/23 238 lb 6.4 oz (108.1 kg)  03/27/23 222 lb (100.7 kg)  01/16/23 237 lb (107.5 kg)   Constitutional: overweight, in NAD Eyes: no exophthalmos ENT: no masses palpated in neck, no cervical lymphadenopathy Cardiovascular: RRR, No MRG Respiratory: CTA B Musculoskeletal: no deformities Skin:  no rashes Neurological: no tremor with outstretched hands  ASSESSMENT: 1. DM2, insulin-dependent, uncontrolled, with  complications - CKD - DR  She does not have a family history of medullary thyroid cancer a personal history of pancreatitis.  2. 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 She did not have RAI treatment at that time, however, she was found to have a thyroid mass in 2005. This was biopsied and returned as normal thyroid tissue.She had RAI treatment at that time. The posttreatment whole-body scan was negative for any cancers.  - Neck U/S (09/19/2014): 1. Post total thyroidectomy. 2. No change in the previously biopsied approximately 1.4 cm echogenic solid nodule within the inferior aspect of the right lobe of the thyroid, grossly unchanged since the 2005 examination. 3. Apparent development of an approximately 0.4 cm hypoechoic nodule within the right thyroidectomy bed - while too small for definitive characterization, this nodule appears to contain an echogenic hilum and thus is favored to represent a non pathologically enlarged cervical lymph node.  - Neck U/S (11/27/2015):  1. Surgical changes of prior total thyroidectomy. 2. Unchanged 1.4 cm echogenic soft tissue nodule in the right thyroid resection bed. Continued stability over time consistent with a benign process.  - Neck U/S (03/02/2020): Stable surgical changes following total thyroidectomy. No residual thyroid bed abnormality.  - WBS (03/27/2020): her copay: 6800$!!  No evidence of local thyroid cancer recurrence or distant metastasis by I 131 imaging.   3. Post surgical hypothyroidism  PLAN:  1. Patient with longstanding, uncontrolled, type 2 diabetes, on basal/bolus insulin regimen, SGLT2 inhibitor and GLP-1 receptor agonist, with improved control after changing from NPH to [place].  HbA1c at last visit was 6.9%, slightly higher, but still at goal.  Sugars were  fluctuating within the target range with only occasional hyperglycemic values after meals but also occasional lows overnight and before meals.  We decreased her Tresiba dose at that time and I discussed with  her about varying the dose of regular insulin based on the size and consistency of her meals. CGM interpretation: -At today's visit, we reviewed her CGM downloads: It appears that 93% of values are in target range (goal >70%), while 1% are higher than 180 (goal <25%), and 6% are lower than 70 (goal <4%).  The calculated average blood sugar is 110.  The projected HbA1c for the next 3 months (GMI) is 5.9%. -Reviewing the CGM trends, sugars are fluctuating within the target range but with lower values throughout the day and especially in the evening.  I advised her to continue to reduce the Tresiba dose.  Upon questioning, she is not taking regular insulin at night at all.  Therefore, the low blood sugars in the evening may be related to too much basal insulin.  She takes approximately 15 units of regular insulin before breakfast.  She does have some low blood sugars early in the day and we discussed about reducing this dose.  She is interested in switching to an analog insulin especially due to the fact that this comes in the pen.  I sent a prescription for Fiasp to her pharmacy.  I advised her to take this at the start of the meal.  If this is not available or if she cannot start this due to coverage, we can try NovoLog or Humalog but I advised her that these are injected 15 minutes before meals.  She had several instances in which she took regular insulin and forgot to eat.  We discussed that it is very important eat after taking rapid acting insulin. - I advised her to: Patient Instructions  Please continue: - Jardiance 10 mg before b'fast - Ozempic 1 mg weekly in a.m.   Change: - FiAsp 10-15 units before b''fast - Tresiba 50 units in am  Please continue levothyroxine 125 mcg daily.  Take the  thyroid hormone every day, with water, at least 30 minutes before breakfast, separated by at least 4 hours from: - acid reflux medications - calcium - iron - multivitamins  Please stop at the lab.  Please return in 4 months.  - we checked her HbA1c: 7.1% (higher) - advised to check sugars at different times of the day - 4x a day, rotating check times - advised for yearly eye exams >> she is UTD - on gabapentin started by PCP-this helps -pt mentions that she had labs with PCP recently - return to clinic in 4 months       2. 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  She has persistently detectable thyroglobulin. -Reviewed the results of her neck ultrasound from 11/2015, which showed a stable mass, consistent with a benign process.  Repeat neck ultrasound in 02/2020, showed stable surgical changes with no residual thyroid abnormalities.  A whole-body scan in 03/2020 showed no evidence of local recurrence or metastasis.  This was a very expensive test for her and she had to pay approximately $7000 for it.  -She has no neck compression symptoms -Her thyroglobulin levels remain detectable, with the latest being slightly higher, at 3.1.  Overall, there is not a significant increase in thyroglobulin since 2017.  We will repeat this today to see if there is a consistent upward trend. -We discussed that she may need a PET scan release the CT scan.  I initially ordered a CT scan but she did not have this as she felt that her insurance  would not cover it.  She tells me that she still has to pay for some of the imaging studies that we ordered in the past.  3. Postsurgical hypothyroidism - latest thyroid labs reviewed with pt. >> normal: Lab Results  Component Value Date   TSH 0.47 01/16/2023  - she continues on LT4 125 mcg daily - pt feels good on this dose. - we discussed about taking the thyroid hormone every  day, with water, >30 minutes before breakfast, separated by >4 hours from acid reflux medications, calcium, iron, multivitamins. Pt. is taking it correctly. - will check another TSH today to be able to interpret the thyroglobulin  Carlus Pavlov, MD PhD [facility]

## 2023-05-24 DIAGNOSIS — E1165 Type 2 diabetes mellitus with hyperglycemia: Secondary | ICD-10-CM | POA: Diagnosis not present

## 2023-05-24 DIAGNOSIS — E1142 Type 2 diabetes mellitus with diabetic polyneuropathy: Secondary | ICD-10-CM | POA: Diagnosis not present

## 2023-05-24 LAB — THYROGLOBULIN LEVEL: Thyroglobulin: 3 ng/mL

## 2023-05-24 LAB — THYROGLOBULIN ANTIBODY: Thyroglobulin Ab: <1 [IU]/mL (ref <=1)

## 2023-05-25 ENCOUNTER — Other Ambulatory Visit: Payer: Self-pay | Admitting: Internal Medicine

## 2023-06-23 DIAGNOSIS — E1165 Type 2 diabetes mellitus with hyperglycemia: Secondary | ICD-10-CM | POA: Diagnosis not present

## 2023-06-23 DIAGNOSIS — E1142 Type 2 diabetes mellitus with diabetic polyneuropathy: Secondary | ICD-10-CM | POA: Diagnosis not present

## 2023-06-26 ENCOUNTER — Ambulatory Visit
Admission: RE | Admit: 2023-06-26 | Discharge: 2023-06-26 | Disposition: A | Discharge status: Home / Self Care | Payer: Medicare HMO | Source: Ambulatory Visit | Attending: Physician Assistant | Admitting: Physician Assistant

## 2023-06-26 DIAGNOSIS — Z1231 Encounter for screening mammogram for malignant neoplasm of breast: Secondary | ICD-10-CM | POA: Diagnosis not present

## 2023-06-28 DIAGNOSIS — R809 Proteinuria, unspecified: Secondary | ICD-10-CM | POA: Diagnosis not present

## 2023-06-28 DIAGNOSIS — I129 Hypertensive chronic kidney disease with stage 1 through stage 4 chronic kidney disease, or unspecified chronic kidney disease: Secondary | ICD-10-CM | POA: Diagnosis not present

## 2023-06-28 DIAGNOSIS — D631 Anemia in chronic kidney disease: Secondary | ICD-10-CM | POA: Diagnosis not present

## 2023-06-28 DIAGNOSIS — N2581 Secondary hyperparathyroidism of renal origin: Secondary | ICD-10-CM | POA: Diagnosis not present

## 2023-06-28 DIAGNOSIS — E1122 Type 2 diabetes mellitus with diabetic chronic kidney disease: Secondary | ICD-10-CM | POA: Diagnosis not present

## 2023-06-28 DIAGNOSIS — N1832 Chronic kidney disease, stage 3b: Secondary | ICD-10-CM | POA: Diagnosis not present

## 2023-06-29 ENCOUNTER — Other Ambulatory Visit: Payer: Self-pay | Admitting: Physician Assistant

## 2023-06-29 DIAGNOSIS — R928 Other abnormal and inconclusive findings on diagnostic imaging of breast: Secondary | ICD-10-CM

## 2023-07-11 ENCOUNTER — Ambulatory Visit
Admission: RE | Admit: 2023-07-11 | Discharge: 2023-07-11 | Disposition: A | Discharge status: Home / Self Care | Payer: Medicare HMO | Source: Ambulatory Visit | Attending: Physician Assistant | Admitting: Physician Assistant

## 2023-07-11 ENCOUNTER — Other Ambulatory Visit: Payer: Self-pay | Admitting: Physician Assistant

## 2023-07-11 DIAGNOSIS — R928 Other abnormal and inconclusive findings on diagnostic imaging of breast: Secondary | ICD-10-CM

## 2023-07-11 DIAGNOSIS — N6315 Unspecified lump in the right breast, overlapping quadrants: Secondary | ICD-10-CM | POA: Diagnosis not present

## 2023-07-11 DIAGNOSIS — N6310 Unspecified lump in the right breast, unspecified quadrant: Secondary | ICD-10-CM

## 2023-07-12 ENCOUNTER — Ambulatory Visit
Admission: RE | Admit: 2023-07-12 | Discharge: 2023-07-12 | Disposition: A | Discharge status: Home / Self Care | Payer: Medicare HMO | Source: Ambulatory Visit | Attending: Physician Assistant | Admitting: Physician Assistant

## 2023-07-12 DIAGNOSIS — N6310 Unspecified lump in the right breast, unspecified quadrant: Secondary | ICD-10-CM

## 2023-07-12 DIAGNOSIS — N6315 Unspecified lump in the right breast, overlapping quadrants: Secondary | ICD-10-CM | POA: Diagnosis not present

## 2023-07-12 DIAGNOSIS — Z0389 Encounter for observation for other suspected diseases and conditions ruled out: Secondary | ICD-10-CM | POA: Diagnosis not present

## 2023-07-12 DIAGNOSIS — R928 Other abnormal and inconclusive findings on diagnostic imaging of breast: Secondary | ICD-10-CM

## 2023-07-12 HISTORY — PX: BREAST BIOPSY: SHX20

## 2023-07-13 LAB — SURGICAL PATHOLOGY

## 2023-07-23 DIAGNOSIS — E1142 Type 2 diabetes mellitus with diabetic polyneuropathy: Secondary | ICD-10-CM | POA: Diagnosis not present

## 2023-07-23 DIAGNOSIS — E1165 Type 2 diabetes mellitus with hyperglycemia: Secondary | ICD-10-CM | POA: Diagnosis not present

## 2023-07-31 DIAGNOSIS — Z1331 Encounter for screening for depression: Secondary | ICD-10-CM | POA: Diagnosis not present

## 2023-07-31 DIAGNOSIS — Z9181 History of falling: Secondary | ICD-10-CM | POA: Diagnosis not present

## 2023-07-31 DIAGNOSIS — Z0000 Encounter for general adult medical examination without abnormal findings: Secondary | ICD-10-CM | POA: Diagnosis not present

## 2023-07-31 DIAGNOSIS — Z139 Encounter for screening, unspecified: Secondary | ICD-10-CM | POA: Diagnosis not present

## 2023-08-02 DIAGNOSIS — E113511 Type 2 diabetes mellitus with proliferative diabetic retinopathy with macular edema, right eye: Secondary | ICD-10-CM | POA: Diagnosis not present

## 2023-08-17 ENCOUNTER — Other Ambulatory Visit: Payer: Self-pay | Admitting: Internal Medicine

## 2023-08-23 DIAGNOSIS — E1165 Type 2 diabetes mellitus with hyperglycemia: Secondary | ICD-10-CM | POA: Diagnosis not present

## 2023-08-23 DIAGNOSIS — E1142 Type 2 diabetes mellitus with diabetic polyneuropathy: Secondary | ICD-10-CM | POA: Diagnosis not present

## 2023-08-31 DIAGNOSIS — Z823 Family history of stroke: Secondary | ICD-10-CM | POA: Diagnosis not present

## 2023-08-31 DIAGNOSIS — N1832 Chronic kidney disease, stage 3b: Secondary | ICD-10-CM | POA: Diagnosis not present

## 2023-08-31 DIAGNOSIS — E785 Hyperlipidemia, unspecified: Secondary | ICD-10-CM | POA: Diagnosis not present

## 2023-08-31 DIAGNOSIS — E890 Postprocedural hypothyroidism: Secondary | ICD-10-CM | POA: Diagnosis not present

## 2023-08-31 DIAGNOSIS — I129 Hypertensive chronic kidney disease with stage 1 through stage 4 chronic kidney disease, or unspecified chronic kidney disease: Secondary | ICD-10-CM | POA: Diagnosis not present

## 2023-08-31 DIAGNOSIS — Z8249 Family history of ischemic heart disease and other diseases of the circulatory system: Secondary | ICD-10-CM | POA: Diagnosis not present

## 2023-08-31 DIAGNOSIS — Z793 Long term (current) use of hormonal contraceptives: Secondary | ICD-10-CM | POA: Diagnosis not present

## 2023-08-31 DIAGNOSIS — E1142 Type 2 diabetes mellitus with diabetic polyneuropathy: Secondary | ICD-10-CM | POA: Diagnosis not present

## 2023-08-31 DIAGNOSIS — E1122 Type 2 diabetes mellitus with diabetic chronic kidney disease: Secondary | ICD-10-CM | POA: Diagnosis not present

## 2023-08-31 DIAGNOSIS — Z7984 Long term (current) use of oral hypoglycemic drugs: Secondary | ICD-10-CM | POA: Diagnosis not present

## 2023-08-31 DIAGNOSIS — M109 Gout, unspecified: Secondary | ICD-10-CM | POA: Diagnosis not present

## 2023-09-04 ENCOUNTER — Other Ambulatory Visit: Payer: Self-pay | Admitting: Internal Medicine

## 2023-09-04 NOTE — Telephone Encounter (Signed)
Levothyroxine refill request complete,

## 2023-09-18 DIAGNOSIS — E113593 Type 2 diabetes mellitus with proliferative diabetic retinopathy without macular edema, bilateral: Secondary | ICD-10-CM | POA: Diagnosis not present

## 2023-09-21 ENCOUNTER — Ambulatory Visit: Payer: Medicare HMO | Admitting: Internal Medicine

## 2023-09-21 ENCOUNTER — Encounter: Payer: Self-pay | Admitting: Internal Medicine

## 2023-09-21 VITALS — BP 120/70 | HR 93 | Ht 67.5 in | Wt 234.8 lb

## 2023-09-21 DIAGNOSIS — Z7984 Long term (current) use of oral hypoglycemic drugs: Secondary | ICD-10-CM | POA: Diagnosis not present

## 2023-09-21 DIAGNOSIS — E1165 Type 2 diabetes mellitus with hyperglycemia: Secondary | ICD-10-CM

## 2023-09-21 DIAGNOSIS — E890 Postprocedural hypothyroidism: Secondary | ICD-10-CM | POA: Diagnosis not present

## 2023-09-21 DIAGNOSIS — E1142 Type 2 diabetes mellitus with diabetic polyneuropathy: Secondary | ICD-10-CM

## 2023-09-21 DIAGNOSIS — E7849 Other hyperlipidemia: Secondary | ICD-10-CM

## 2023-09-21 DIAGNOSIS — C73 Malignant neoplasm of thyroid gland: Secondary | ICD-10-CM | POA: Diagnosis not present

## 2023-09-21 DIAGNOSIS — Z7985 Long-term (current) use of injectable non-insulin antidiabetic drugs: Secondary | ICD-10-CM

## 2023-09-21 DIAGNOSIS — Z794 Long term (current) use of insulin: Secondary | ICD-10-CM

## 2023-09-21 LAB — POCT GLYCOSYLATED HEMOGLOBIN (HGB A1C): Hemoglobin A1C: 7.3 % — AB (ref 4.0–5.6)

## 2023-09-21 MED ORDER — LEVOTHYROXINE SODIUM 125 MCG PO TABS: 125.0000 ug | ORAL_TABLET | Freq: Every day | ORAL | 3 refills | Status: DC

## 2023-09-21 NOTE — Patient Instructions (Addendum)
 Please continue: - Jardiance 10 mg before b'fast - Ozempic  1 mg weekly in a.m.  - FiAsp  10-15 units before b''fast - Tresiba  50 units in am  STOP GINGER ALE AND SWEET TEA!  Please continue levothyroxine  125 mcg daily.  Take the thyroid  hormone every day, with water, at least 30 minutes before breakfast, separated by at least 4 hours from: - acid reflux medications - calcium - iron - multivitamins  Please return in 4 months.

## 2023-09-21 NOTE — Progress Notes (Signed)
 Patient ID: Courtney [name], female   DOB: 11-Oct-1955, 68 y.o.   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 Last visit 4 months ago. PCP: Raford Beat - [place], [place].  Interim history: No blurry vision, nausea, chest pain.   DM2: Reviewed HbA1c levels: Lab Results  Component Value Date   HGBA1C 7.1 (A) 05/22/2023   HGBA1C 6.9 (A) 01/16/2023   HGBA1C 6.8 (A) 09/09/2022   HGBA1C 7.1 (A) 05/04/2022   HGBA1C 7.8 (A) 08/26/2021   HGBA1C 7.0 (A) 04/26/2021   HGBA1C 6.9 (A) 12/23/2020   HGBA1C 6.8 (A) 08/19/2020   HGBA1C 7.8 (A) 05/19/2020   HGBA1C 10.7 (A) 02/13/2020   HGBA1C 12.6 (A) 11/13/2019   HGBA1C 9.8 (A) 05/13/2019   HGBA1C 13.7 (A) 10/23/2018   HGBA1C 10.1 (A) 06/25/2018   HGBA1C 8.6 (A) 02/14/2018   HGBA1C 8.3 11/13/2017   HGBA1C 9.1 08/16/2017   HGBA1C 11.3 05/31/2017   HGBA1C 11.9 02/24/2017   HGBA1C 9.5 11/25/2016   HGBA1C 8.2 (H) 04/13/2016   HGBA1C 9.1 02/05/2016   HGBA1C 9.8 (H) 09/18/2015   HGBA1C 9.9 06/19/2015   HGBA1C 11.0 03/27/2015   HGBA1C 10.9 (H) 12/26/2014  08/22/2016: HbA1c 11.8% 08/01/2014: HbA1c 14.5% 05/02/2014: HbA1c 14.8% 01/31/2014: HbA1c 13.6% 11/01/2013: HbA1c 15.1%  She is on: - Tresiba  70 >> 60 >> 50 units in a.m. - Novolin  Regular insulin  15-18 units before b'fast  >> FiAsp  10-15 units before b''fast - Ozempic  0.5 >> 1 mg weekly  - Jardiance 10 mg daily - started by Dr. Douglas 01/2021 Stopped Metformin  500 mg 2x a day - b/c leg swelling. She was on Novolin  70/30 48 units 2x a day 15 min before the meals  She was on Amaryl 8 mg in am She was on Metformin  >> stopped 2/2 CKD. She was previously on Toujeo . She was previously on Farxiga.  She checks sugars >4x a day with the Libre CGM (from  [place]):   Previously:    Previously:   Lowest sugar was 57 >> 50s >> 56 (at night - compression low); it is unclear at which level she has hypoglycemia Highest sugar was 325 >> ... >> 200 >> (after sweet drinks): 284.  Glucometer: Prodigy >> ReliOn  Pt's meals are: - Breakfast: chicken  - Lunch: peanuts, sandwich + crystal lite drink - Dinner: frozen dinner - Snacks: fruit, small bag potato chips, peanuts She is not having regular meals.  She continues to drink sweet tea.  -+ Stage IIIb CKD -seeing nephrology. 06/28/2023: 18/1.72, GFR 32, PCR 433 (<184) Lab Results  Component Value Date   BUN 15 09/01/2021   Lab Results  Component Value Date   CREATININE 1.33 (H) 09/01/2021  On Cozaar.  -+ HL; lipids: Lab Results  Component Value Date   CHOL 143 08/26/2021   HDL 77.60 08/26/2021   LDLCALC 51 08/26/2021   TRIG 70.0 08/26/2021   CHOLHDL 2 08/26/2021  On lovastatin 20.  - last eye exam was in 12/2022: + DR; she had laser surgeries and is back on intraocular injections for macular edema.  She had left eye cataract surgery.   PCP started Gabapentin >> good results. - She has numbness and tingling in her right foot -sees Dr. Ashley.  I performed a foot exam here in the clinic 01/16/2023.  On gabapentin  by PCP.  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  - She did not have RAI treatment at that time, however, she was found to have a thyroid  mass in 2005 >> This was biopsied and returned as normal thyroid  tissue, but had RAI treatment at that time.  - The posttreatment whole-body scan was negative for any cancer spread.   Thyroglobulin returned at 2.3 in 08/2014 >> I ordered a neck ultrasound that showed a stable nodule with a fatty hilum, consistent with a benign lymph  node, but no other masses in the surgical bed.   The thyroglobulin level remained detectable, and was actually 9.9 in 10/2015.   A neck ultrasound (11/2015) showed a stable 1.4 nodule in the thyroid  bed, consistent with a benign process   Her thyroglobulin levels continue to be detectable  A neck ultrasound (02/2020) was negative for metastasis or recurrences  A whole-body scan (03/2020) was also negative for abnormal uptake  Reviewed her thyroglobulin and ATA antibodies: Lab Results  Component Value Date   THYROGLB 3.0 05/22/2023   THYROGLB 3.1 01/16/2023   THYROGLB 2.9 12/30/2021   THYROGLB 2.3 (L) 04/26/2021   THYROGLB 2.0 (L) 08/19/2020   THYROGLB 3.3 02/13/2020   THYROGLB 2.1 (L) 05/13/2019   THYROGLB 2.5 (L) 06/25/2018   THYROGLB 1.8 (L) 08/15/2017   THYROGLB 2.5 (L) 08/29/2016   THGAB <1 05/22/2023   THGAB <1 01/16/2023   THGAB <1 12/30/2021   THGAB <1 04/26/2021   THGAB <1 08/19/2020   THGAB <1 02/13/2020   THGAB <1 05/13/2019   THGAB <1 06/25/2018   THGAB <1 08/15/2017   THGAB <1 08/29/2016   Postsurgical hypothyroidism:  Reviewed her TFTs: Lab Results  Component Value Date   TSH 0.27 (L) 05/22/2023   TSH 0.47 01/16/2023   TSH 0.43 05/04/2022   TSH 0.16 (L) 12/30/2021   TSH 0.20 (L) 08/26/2021   FREET4 1.34 01/16/2023   FREET4 1.70 (H) 05/04/2022   FREET4 1.80 (H) 12/30/2021   FREET4 1.30 08/26/2021   FREET4 1.39 04/26/2021   Pt is on levothyroxine  125 mcg daily: - in am - fasting - at least 30 min from b'fast - no calcium - no iron - no multivitamins - no PPIs - not on Biotin  No FH of thyroid  cancer. No h/o radiation tx to head or neck other than RAI treatment. No Biotin use. No recent steroids use.   Pt denies: - feeling nodules in neck - hoarseness - dysphagia - choking  She has a h/o irregular menses. She developed postmenopausal bleeding in 2019 >> she had D&C on 09/01/2021.  She is on vitamin D  once a week.  ROS: + see  HPI  I reviewed pt's medications, allergies, PMH, social hx, family hx, and changes were documented in the history of present illness. Otherwise, unchanged from my initial visit note.  Past Medical History:  Diagnosis Date   Anemia associated with chronic renal failure    Arthritis    Chronic kidney disease (CKD), stage III (moderate) (HCC)    nephrologist-- dr s. douglas   Diabetic retinopathy of both eyes (HCC)    followed by dr c. haddad in [place];  pt stated gets eye injections every 6 months   History of COVID-19 2020   per pt mild to moderate symptoms ,  no hospital admission,  symptoms resolved with exceptin taste/ smell still not right   History of thyroid  cancer 09/2002   endocrinologist--- dr trixie;  s/p total thyroidectomy [id number] for multinodular goiter, dx follicular varient papillary thyroid  cancer, no chemo/ radiation;   RAI for normal bx of mass in 2005;  no recurrence   Hx MRSA infection 06/2012   right thigh abscess with sepsis, hospital admission   Hyperlipidemia    Hypertension    Hypothyroidism, postsurgical 2004   endocrinologist-  dr trixie;   10-03-2002  s/p total thyroidectomy for multinodular goiter/ thyroid  cancer   Insulin  dependent type 2 diabetes mellitus [facility])    endocrinologist--- dr trixie;    (08-27-2021 per pt does not check blood sugar at home, only every once in a while)   Neuropathy due to secondary diabetes (HCC)    PMB (postmenopausal bleeding)    Secondary hyperparathyroidism of renal origin [facility])    Wears glasses    Past Surgical History:  Procedure Laterality Date   APPLICATION OF WOUND VAC     07-11-2012 and 07-14-2012 @[facility] ;   to right thigh wound with dressing change   BILATERAL CARPAL TUNNEL RELEASE Bilateral 2003   BREAST BIOPSY Right 07/12/2023   US  RT BREAST BX W LOC DEV 1ST LESION IMG BX SPEC US  GUIDE 07/12/2023 [facility]   CATARACT EXTRACTION W/ INTRAOCULAR LENS IMPLANT Bilateral    left 2016;  right 2018    CHOLECYSTECTOMY OPEN     1990s   COLONOSCOPY WITH PROPOFOL  N/A 05/14/2020   Procedure: COLONOSCOPY WITH PROPOFOL ;  Surgeon: Unk Corinn Skiff, MD;  Location: [facility] ENDOSCOPY;  Service: Gastroenterology;  Laterality: N/A;   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   HYSTEROSCOPY WITH D & C N/A 09/01/2021   Procedure: DILATATION AND CURETTAGE LELDON NAIL;  Surgeon: Zina Jerilynn LABOR, MD;  Location: [facility] [place];  Service: Gynecology;  Laterality: N/A;   INCISION AND DRAINAGE ABSCESS Right 07/08/2012   @[facility] ;  w/ excisional debridement right thigh   KNEE ARTHROSCOPY Left 05/05/2016   Procedure: ARTHROSCOPY KNEE;  Surgeon: Franky Cranker, MD;  Location: [facility] ORS;  Service: Orthopedics;  Laterality: Left;   SHOULDER ARTHROSCOPY Left 03/2002   TOTAL THYROIDECTOMY Bilateral 10/03/2002   @[facility]    TRIGGER FINGER RELEASE     1990s ;   right hand   History   Social History Main Topics   Smoking status: Former Smoker   Smokeless tobacco: Not on file   Alcohol Use: No   Drug Use: No   Social History Narrative   Single   0 children   Sales executive (travels regularly)      Beer and wine on occasion   First menstrual cycle: 6th grade   Postmenopausal      Current Outpatient Medications  Medication Sig Dispense Refill   Acetaminophen  (TYLENOL ) 325 MG CAPS Take by mouth as needed.     allopurinol (ZYLOPRIM) 100 MG tablet Take 100 mg by mouth daily.     amLODipine (NORVASC) 10 MG tablet Take 10 mg by mouth daily.     Cholecalciferol 125 MCG (5000 UT) capsule Take 5,000 Units by mouth once a week.     Continuous Blood Gluc Receiver (FREESTYLE LIBRE 2 READER) DEVI 1 each by Does not apply route daily. 1 each 0   Continuous Blood Gluc Sensor (FREESTYLE LIBRE 2 SENSOR) MISC 1 each by Does not apply route every 14 (fourteen) days. E11.65 6 each 3   diphenhydrAMINE (BENADRYL) 50  MG tablet Take 50 mg by mouth as needed.     empagliflozin (JARDIANCE) 10 MG TABS tablet Take 10 mg  by mouth daily.     gabapentin (NEURONTIN) 100 MG capsule TAKE 1 TO 3 CAPSULES BY MOUTH NIGHTLY FOR NEUROPATHY PAIN     insulin  aspart (FIASP  FLEXTOUCH) 100 UNIT/ML FlexTouch Pen Inject 10-15 Units into the skin daily before breakfast. 15 mL 3   Insulin  Pen Needle 32G X 4 MM MISC Use 3x a day 200 each 3   insulin  regular (NOVOLIN  R) 100 units/mL injection Inject up to 60 units under skin as advised 60 mL 1   Insulin  Syringe-Needle U-100 31G X 15/64 0.5 ML MISC Use 3x a day 300 each 3   levothyroxine  (SYNTHROID ) 125 MCG tablet TAKE 1 TABLET BY MOUTH ONCE DAILY BEFORE  BREAKFAST 90 tablet 0   lovastatin (MEVACOR) 20 MG tablet Take 20 mg by mouth daily.     medroxyPROGESTERone  (PROVERA ) 10 MG tablet Take 1 tablet (10 mg total) by mouth daily. 30 tablet 8   Polyvinyl Alcohol-Povidone (REFRESH OP) Apply to eye as needed.     Semaglutide , 1 MG/DOSE, (OZEMPIC , 1 MG/DOSE,) 4 MG/3ML SOPN INJECT 1MG  SUBCUTANEOUSLY ONCE WEEKLY 9 mL 2   terconazole  (TERAZOL 7 ) 0.4 % vaginal cream Place 1 applicator vaginally at bedtime. 45 g 0   torsemide (DEMADEX) 20 MG tablet Take 20 mg by mouth daily.     TRESIBA  FLEXTOUCH 200 UNIT/ML FlexTouch Pen Inject 70 Units into the skin daily. INJECT 70 UNITS SUBCUTANEOUSLY ONCE DAILY 30 mL 2   No current facility-administered medications for this visit.   NKDA   Family History  Problem Relation Age of Onset   Hypertension Mother    Thyroid  disease Mother    CVA Mother    Hypertension Sister    Thyroid  disease Sister    Hypertension Brother    Hyperlipidemia Brother    PE:  BP 120/70   Pulse 93   Ht 5' 7.5 (1.715 m)   Wt 234 lb 12.8 oz (106.5 kg)   SpO2 98%   BMI 36.23 kg/m  Wt Readings from Last 3 Encounters:  09/21/23 234 lb 12.8 oz (106.5 kg)  05/22/23 238 lb 6.4 oz (108.1 kg)  03/27/23 222 lb (100.7 kg)   Constitutional: overweight, in NAD Eyes: no exophthalmos ENT: no masses palpated in neck, no cervical lymphadenopathy Cardiovascular: RRR, No  MRG Respiratory: CTA B Musculoskeletal: no deformities Skin:  no rashes Neurological: no tremor with outstretched hands Diabetic Foot Exam - Simple   Simple Foot Form Diabetic Foot exam was performed with the following findings: Yes 09/21/2023 10:47 AM  Visual Inspection No deformities, no ulcerations, no other skin breakdown bilaterally: Yes Sensation Testing Intact to touch and monofilament testing bilaterally: Yes Pulse Check See comments: Yes Comments Slightly Decreased pulses in B pedal pulses    ASSESSMENT: 1. DM2, insulin -dependent, uncontrolled, with complications - CKD - DR  She does not have a family history of medullary thyroid  cancer a personal history of pancreatitis.  2. 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 She did not have RAI treatment at that time, however, she was found to have a thyroid  mass in 2005. This was biopsied and returned as normal thyroid  tissue.She had RAI treatment at that time. The posttreatment whole-body scan was negative for any cancers.  - Neck  U/S (09/19/2014): 1. Post total thyroidectomy. 2. No change in the previously biopsied approximately 1.4 cm echogenic solid nodule within the inferior aspect of the right lobe of the thyroid , grossly unchanged since the 2005 examination. 3. Apparent development of an approximately 0.4 cm hypoechoic nodule within the right thyroidectomy bed - while too small for definitive characterization, this nodule appears to contain an echogenic hilum and thus is favored to represent a non pathologically enlarged cervical lymph node.  - Neck U/S (11/27/2015):  1. Surgical changes of prior total thyroidectomy. 2. Unchanged 1.4 cm echogenic soft tissue nodule in the right thyroid  resection bed. Continued stability over time consistent with a benign  process.  - Neck U/S (03/02/2020): Stable surgical changes following total thyroidectomy. No residual thyroid  bed abnormality.  - WBS (03/27/2020): her copay: 6800$!!  No evidence of local thyroid  cancer recurrence or distant metastasis by I 131 imaging.   3. Post surgical hypothyroidism  PLAN:  1. Patient with longstanding, uncontrolled, type 2 diabetes, on basal-bolus insulin  regimen along with SGLT2 inhibitor and weekly GLP-1 receptor agonist, with improved control after changing from NPH to Tresiba .  HbA1c at last visit was slightly higher, at 7.1%.  At that time sugars were fluctuating within the target range but with lower values throughout the day and especially in the evening.  We discussed about reducing the Tresiba  dose.  She was on regular insulin  and we discussed about trying to switch to Fiasp .  I advised her to inject this right at the start of the meal.  I made this suggestion as she was trying to take the regular insulin  30 minutes before the meal but sometimes was forgetting to eat afterwards.  We discussed that this was predisposing her to lows. CGM interpretation: -At today's visit, we reviewed her CGM downloads: It appears that 87% of values are in target range (goal >70%), while 11% are higher than 180 (goal <25%), and 2% are lower than 70 (goal <4%).  The calculated average blood sugar is 126.  The projected HbA1c for the next 3 months (GMI) is 6.3%. -Reviewing the CGM trends, sugars appear to be fluctuating mainly within the target range but with a larger coefficient of variation compared to last visit.  Upon questioning, she is drinking sweet drinks and sugars are quite high after these, in the upper 200s.  I strongly advised her to stop the ginger ale and sweet tea and not drink any sweet drinks.  She is very good dose of ER based on the size of her breakfast.  Will continue this for now and also continue the rest of the regimen. - I advised her to: Patient Instructions   Please continue: - Jardiance 10 mg before b'fast - Ozempic  1 mg weekly in a.m.  - FiAsp  10-15 units before b''fast - Tresiba  50 units in am  STOP GINGER ALE AND SWEET TEA!  Please continue levothyroxine  125 mcg daily.  Take the thyroid  hormone every day, with water, at least 30 minutes before breakfast, separated by at least 4 hours from: - acid reflux medications - calcium - iron - multivitamins  Please return in 4 months.  - we checked her HbA1c: 7.3% (higher) - advised to check sugars at different times of the day - 4x a day, rotating check times - advised for yearly eye exams >> she is UTD - return to clinic in 4 months       2. 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  She has persistently detectable thyroglobulin. -I reviewed the results of her neck ultrasound from 11/2015, which showed a stable mass, consistent with a benign process.  A repeat neck ultrasound in 02/2020 showed stable surgical changes with no residual thyroid  abnormalities.  We checked a whole-body scan in 03/2020 and this did not show any evidence of local recurrence or metastasis.  However, this was a very expensive test and she had to pay approximately $7000 for it.  As a consequence, she declined further imaging studies. -As mentioned above, her thyroglobulin levels remain detectable, with the latest being 3.0 in 05/2023, previously 3.1 in 01/2023.  We did discuss about possibly obtaining a PET scan and a neck CT scan but due to her high co-pay, she preferred to only be followed by thyroglobulin -She has no neck compression symptoms at today's visit -Plan to check another thyroglobulin level at next visit  3. Postsurgical hypothyroidism - latest thyroid  labs reviewed with pt. >> TSH was at goal in the setting of thyroid  cancer: Lab Results  Component Value Date   TSH 0.27 (L) 05/22/2023  - she continues on LT4 125  mcg daily - pt feels good on this dose. - we discussed about taking the thyroid  hormone every day, with water, >30 minutes before breakfast, separated by >4 hours from acid reflux medications, calcium, iron, multivitamins. Pt. is taking it correctly. - will check thyroid  tests at next visit - I refilled her LT4  Lela Fendt, MD PhD [facility]

## 2023-09-22 DIAGNOSIS — E1165 Type 2 diabetes mellitus with hyperglycemia: Secondary | ICD-10-CM | POA: Diagnosis not present

## 2023-09-22 DIAGNOSIS — E1142 Type 2 diabetes mellitus with diabetic polyneuropathy: Secondary | ICD-10-CM | POA: Diagnosis not present

## 2023-10-09 ENCOUNTER — Other Ambulatory Visit (HOSPITAL_COMMUNITY)
Admission: RE | Admit: 2023-10-09 | Discharge: 2023-10-09 | Disposition: A | Discharge status: Home / Self Care | Payer: Medicare HMO | Source: Ambulatory Visit | Attending: Obstetrics and Gynecology | Admitting: Obstetrics and Gynecology

## 2023-10-09 ENCOUNTER — Ambulatory Visit: Payer: Medicare HMO | Admitting: Obstetrics and Gynecology

## 2023-10-09 ENCOUNTER — Encounter: Payer: Self-pay | Admitting: Obstetrics and Gynecology

## 2023-10-09 ENCOUNTER — Other Ambulatory Visit: Payer: Self-pay

## 2023-10-09 VITALS — BP 149/72 | HR 84 | Wt 234.9 lb

## 2023-10-09 DIAGNOSIS — N8501 Benign endometrial hyperplasia: Secondary | ICD-10-CM | POA: Diagnosis not present

## 2023-10-09 DIAGNOSIS — Z1331 Encounter for screening for depression: Secondary | ICD-10-CM | POA: Diagnosis not present

## 2023-10-09 MED ORDER — MEDROXYPROGESTERONE ACETATE 10 MG PO TABS: 10.0000 mg | ORAL_TABLET | Freq: Every day | ORAL | 11 refills | Status: AC

## 2023-10-09 NOTE — Progress Notes (Signed)
   Subjective:    Patient ID: Courtney [name], female    DOB: 09/26/1955, 68 y.o.   MRN: [id number]  HPI    Review of Systems     Objective:   Physical Exam Vitals:   10/09/23 1019  BP: (!) 149/72  Pulse: 84  ENDOMETRIAL BIOPSY      Courtney [name] is a 68 y.o. G0P0000 here for endometrial biopsy.  The indications for endometrial biopsy were reviewed.  Risks of the biopsy including cramping, bleeding, infection, uterine perforation, inadequate specimen and need for additional procedures were discussed. The patient states she understands and agrees to undergo procedure today. Consent was signed. Time out was performed.   Indications: history of simple hyperplasia Urine HCG: postmenopausal  A bivalve speculum was placed into the vagina and the cervix was easily visualized and was prepped with Betadine x2. A single-toothed tenaculum was placed on the anterior lip of the cervix to stabilize it. The 3 mm pipelle was introduced into the endometrial cavity without difficulty to a depth of 9 cm, and a moderate amount of tissue was obtained and sent to pathology. This was repeated for a total of 2 passes. The instruments were removed from the patient's vagina. Minimal bleeding from the cervix at the tenaculum was noted.   The patient tolerated the procedure well. Routine post-procedure instructions were given to the patient.    Will base further management on results of biopsy.      Assessment & Plan:   1. Simple endometrial hyperplasia (Primary) Endometrial biopsy performed today.  No recurrence of post menopausal bleeding Pt declines hysterectomy of progesterone IUD.  She would prefer to continue daily provera  - Surgical pathology( [place]/ [facility]) - medroxyPROGESTERone (PROVERA) 10 MG tablet; Take 1 tablet (10 mg total) by mouth daily.  Dispense: 30 tablet; Refill: 11  F/u in 1 year, will discuss with gyn onc any changes to plan since pt declines surgery or IUD.  Warden Fillers, MD Faculty Attending, [facility]

## 2023-10-10 LAB — SURGICAL PATHOLOGY

## 2023-10-13 ENCOUNTER — Encounter: Payer: Self-pay | Admitting: Obstetrics and Gynecology

## 2023-10-16 DIAGNOSIS — R69 Illness, unspecified: Secondary | ICD-10-CM | POA: Diagnosis not present

## 2023-10-22 DIAGNOSIS — E1142 Type 2 diabetes mellitus with diabetic polyneuropathy: Secondary | ICD-10-CM | POA: Diagnosis not present

## 2023-10-22 DIAGNOSIS — E1165 Type 2 diabetes mellitus with hyperglycemia: Secondary | ICD-10-CM | POA: Diagnosis not present

## 2023-11-02 ENCOUNTER — Encounter: Payer: Self-pay | Admitting: General Practice

## 2023-11-13 DIAGNOSIS — E113511 Type 2 diabetes mellitus with proliferative diabetic retinopathy with macular edema, right eye: Secondary | ICD-10-CM | POA: Diagnosis not present

## 2023-11-15 ENCOUNTER — Telehealth: Payer: Self-pay

## 2023-11-15 DIAGNOSIS — E1165 Type 2 diabetes mellitus with hyperglycemia: Secondary | ICD-10-CM

## 2023-11-15 MED ORDER — FREESTYLE LIBRE 2 SENSOR MISC: 1.0000 | 3 refills | Status: DC

## 2023-11-15 NOTE — Telephone Encounter (Signed)
 Requested Prescriptions   Signed Prescriptions Disp Refills   Continuous Glucose Sensor (FREESTYLE LIBRE 2 SENSOR) MISC 6 each 3    Sig: 1 each by Does not apply route every 14 (fourteen) days. E11.65    Authorizing Provider: Carlus Pavlov    Ordering User: Pollie Meyer

## 2023-11-29 DIAGNOSIS — E890 Postprocedural hypothyroidism: Secondary | ICD-10-CM | POA: Diagnosis not present

## 2023-11-29 DIAGNOSIS — E785 Hyperlipidemia, unspecified: Secondary | ICD-10-CM | POA: Diagnosis not present

## 2023-11-29 DIAGNOSIS — E559 Vitamin D deficiency, unspecified: Secondary | ICD-10-CM | POA: Diagnosis not present

## 2023-11-29 DIAGNOSIS — N183 Chronic kidney disease, stage 3 unspecified: Secondary | ICD-10-CM | POA: Diagnosis not present

## 2023-11-29 DIAGNOSIS — M109 Gout, unspecified: Secondary | ICD-10-CM | POA: Diagnosis not present

## 2023-11-29 DIAGNOSIS — I10 Essential (primary) hypertension: Secondary | ICD-10-CM | POA: Diagnosis not present

## 2023-11-29 LAB — LAB REPORT - SCANNED: EGFR: 36

## 2023-12-05 ENCOUNTER — Encounter: Payer: Self-pay | Admitting: Internal Medicine

## 2023-12-05 ENCOUNTER — Other Ambulatory Visit: Payer: Self-pay | Admitting: Internal Medicine

## 2023-12-18 DIAGNOSIS — E113511 Type 2 diabetes mellitus with proliferative diabetic retinopathy with macular edema, right eye: Secondary | ICD-10-CM | POA: Diagnosis not present

## 2023-12-25 DIAGNOSIS — M7062 Trochanteric bursitis, left hip: Secondary | ICD-10-CM | POA: Diagnosis not present

## 2023-12-25 DIAGNOSIS — M65342 Trigger finger, left ring finger: Secondary | ICD-10-CM | POA: Diagnosis not present

## 2023-12-25 DIAGNOSIS — E1122 Type 2 diabetes mellitus with diabetic chronic kidney disease: Secondary | ICD-10-CM | POA: Diagnosis not present

## 2024-01-09 DIAGNOSIS — H5201 Hypermetropia, right eye: Secondary | ICD-10-CM | POA: Diagnosis not present

## 2024-01-09 LAB — HM DIABETES EYE EXAM

## 2024-01-10 DIAGNOSIS — E1122 Type 2 diabetes mellitus with diabetic chronic kidney disease: Secondary | ICD-10-CM | POA: Diagnosis not present

## 2024-01-10 DIAGNOSIS — N184 Chronic kidney disease, stage 4 (severe): Secondary | ICD-10-CM | POA: Diagnosis not present

## 2024-01-10 DIAGNOSIS — I10 Essential (primary) hypertension: Secondary | ICD-10-CM | POA: Diagnosis not present

## 2024-01-10 DIAGNOSIS — N2889 Other specified disorders of kidney and ureter: Secondary | ICD-10-CM | POA: Diagnosis not present

## 2024-01-10 DIAGNOSIS — I129 Hypertensive chronic kidney disease with stage 1 through stage 4 chronic kidney disease, or unspecified chronic kidney disease: Secondary | ICD-10-CM | POA: Diagnosis not present

## 2024-01-10 DIAGNOSIS — D631 Anemia in chronic kidney disease: Secondary | ICD-10-CM | POA: Diagnosis not present

## 2024-01-10 DIAGNOSIS — R809 Proteinuria, unspecified: Secondary | ICD-10-CM | POA: Diagnosis not present

## 2024-01-10 DIAGNOSIS — N2581 Secondary hyperparathyroidism of renal origin: Secondary | ICD-10-CM | POA: Diagnosis not present

## 2024-01-10 DIAGNOSIS — N1832 Chronic kidney disease, stage 3b: Secondary | ICD-10-CM | POA: Diagnosis not present

## 2024-01-22 DIAGNOSIS — Z0100 Encounter for examination of eyes and vision without abnormal findings: Secondary | ICD-10-CM | POA: Diagnosis not present

## 2024-01-23 ENCOUNTER — Ambulatory Visit (INDEPENDENT_AMBULATORY_CARE_PROVIDER_SITE_OTHER): Payer: Medicare HMO | Admitting: Internal Medicine

## 2024-01-23 ENCOUNTER — Encounter: Payer: Self-pay | Admitting: Internal Medicine

## 2024-01-23 VITALS — BP 120/70 | HR 83 | Ht 67.5 in | Wt 229.4 lb

## 2024-01-23 DIAGNOSIS — E1142 Type 2 diabetes mellitus with diabetic polyneuropathy: Secondary | ICD-10-CM

## 2024-01-23 DIAGNOSIS — E890 Postprocedural hypothyroidism: Secondary | ICD-10-CM | POA: Diagnosis not present

## 2024-01-23 DIAGNOSIS — Z7984 Long term (current) use of oral hypoglycemic drugs: Secondary | ICD-10-CM | POA: Diagnosis not present

## 2024-01-23 DIAGNOSIS — Z7985 Long-term (current) use of injectable non-insulin antidiabetic drugs: Secondary | ICD-10-CM

## 2024-01-23 DIAGNOSIS — C73 Malignant neoplasm of thyroid gland: Secondary | ICD-10-CM | POA: Diagnosis not present

## 2024-01-23 DIAGNOSIS — E1165 Type 2 diabetes mellitus with hyperglycemia: Secondary | ICD-10-CM | POA: Diagnosis not present

## 2024-01-23 DIAGNOSIS — E7849 Other hyperlipidemia: Secondary | ICD-10-CM

## 2024-01-23 LAB — POCT GLYCOSYLATED HEMOGLOBIN (HGB A1C): Hemoglobin A1C: 7.1 % — AB (ref 4.0–5.6)

## 2024-01-23 LAB — TSH: TSH: 0.07 m[IU]/L — ABNORMAL LOW (ref 0.40–4.50)

## 2024-01-23 LAB — T4, FREE: Free T4: 1.8 ng/dL (ref 0.8–1.8)

## 2024-01-23 MED ORDER — TRESIBA FLEXTOUCH 200 UNIT/ML ~~LOC~~ SOPN: 42.0000 [IU] | PEN_INJECTOR | Freq: Every day | SUBCUTANEOUS | Status: DC

## 2024-01-23 MED ORDER — OZEMPIC (1 MG/DOSE) 4 MG/3ML ~~LOC~~ SOPN: PEN_INJECTOR | SUBCUTANEOUS | 3 refills | Status: AC

## 2024-01-23 NOTE — Patient Instructions (Addendum)
 Please continue: - Jardiance 10 mg before b'fast - Ozempic  1 mg weekly in a.m.  - FiAsp  10-15 units before b'fast  Please decrease:  - Tresiba  42 units in am  STOP GINGER ALE AND SWEET TEA!  Please continue levothyroxine  125 mcg daily.  Take the thyroid  hormone every day, with water, at least 30 minutes before breakfast, separated by at least 4 hours from: - acid reflux medications - calcium - iron - multivitamins  Please stop at the lab.  Please return in 4 months.

## 2024-01-23 NOTE — Progress Notes (Signed)
 Patient ID: Courtney [name], female   DOB: 11/17/1955, 68 y.o.   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 Last visit 4 months ago. PCP: Charolotte Copp - [place], [place].  Interim history: No blurry vision, nausea, chest pain.  She still drinks sweet drinks -sweet tea and ginger ale every day.  DM2: Reviewed HbA1c levels: Lab Results  Component Value Date   HGBA1C 7.3 (A) 09/21/2023   HGBA1C 7.1 (A) 05/22/2023   HGBA1C 6.9 (A) 01/16/2023   HGBA1C 6.8 (A) 09/09/2022   HGBA1C 7.1 (A) 05/04/2022   HGBA1C 7.8 (A) 08/26/2021   HGBA1C 7.0 (A) 04/26/2021   HGBA1C 6.9 (A) 12/23/2020   HGBA1C 6.8 (A) 08/19/2020   HGBA1C 7.8 (A) 05/19/2020   HGBA1C 10.7 (A) 02/13/2020   HGBA1C 12.6 (A) 11/13/2019   HGBA1C 9.8 (A) 05/13/2019   HGBA1C 13.7 (A) 10/23/2018   HGBA1C 10.1 (A) 06/25/2018   HGBA1C 8.6 (A) 02/14/2018   HGBA1C 8.3 11/13/2017   HGBA1C 9.1 08/16/2017   HGBA1C 11.3 05/31/2017   HGBA1C 11.9 02/24/2017   HGBA1C 9.5 11/25/2016   HGBA1C 8.2 (H) 04/13/2016   HGBA1C 9.1 02/05/2016   HGBA1C 9.8 (H) 09/18/2015   HGBA1C 9.9 06/19/2015   HGBA1C 11.0 03/27/2015   HGBA1C 10.9 (H) 12/26/2014  08/22/2016: HbA1c 11.8% 08/01/2014: HbA1c 14.5% 05/02/2014: HbA1c 14.8% 01/31/2014: HbA1c 13.6% 11/01/2013: HbA1c 15.1%  She is on: - Tresiba  70 >> 60 >> 50 units in a.m. - Novolin  Regular insulin  15-18 units before b'fast  >> FiAsp  10-15 units before b''fast - Ozempic  0.5 >> 1 mg weekly  - Jardiance 10 mg daily - started by Dr. Lamount Pimple 01/2021 Stopped Metformin  500 mg 2x a day - b/c leg swelling. She was on Novolin  70/30 48 units 2x a day 15 min before the meals  She was on Amaryl 8 mg in am She was on Metformin  >> stopped 2/2 CKD. She was previously on [place]o . She was  previously on [place].  She checks sugars >4x a day with the Libre CGM (from [place]):  Previously:   Previously:  Lowest sugar was 57 >> 50s >> 56 (at night - compression low) >> 50s; it is unclear at which level she has hypoglycemia Highest sugar was 325 >> ... >> 200 >> (after sweet drinks): 284 >> 200s.  Glucometer: Prodigy >> ReliOn  Pt's meals are: - Breakfast: chicken  - Lunch: peanuts, sandwich + crystal lite drink - Dinner: frozen dinner - Snacks: fruit, small bag potato chips, peanuts She is not having regular meals.  She continues to drink sweet tea.  -+ Stage IIIb CKD -seeing nephrology. 11/29/2023: 13/1.55, GFR 36, glucose 60 06/28/2023: 18/1.72, GFR 32, PCR 433 (<184) Lab Results  Component Value Date   BUN 15 09/01/2021   Lab Results  Component Value Date   CREATININE 1.33 (H) 09/01/2021  On Cozaar.  -+ HL; lipids: 11/29/2023: 133/70/68/51 Lab Results  Component Value Date   CHOL 143 08/26/2021   HDL 77.60 08/26/2021   LDLCALC 51 08/26/2021   TRIG 70.0 08/26/2021   CHOLHDL 2 08/26/2021  On lovastatin 20.  - last eye exam was on 01/09/2024: + DR; she had laser surgeries and is back on intraocular injections for macular edema.  She had left eye cataract surgery.   - She has numbness and tingling  in her right foot -sees Dr. Althea Atkinson.  Last  foot exam was here in the clinic 09/21/2023.  On gabapentin by PCP.  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  - She did not have RAI treatment at that time, however, she was found to have a thyroid  mass in 2005 >> This was biopsied and returned as normal thyroid  tissue, but had RAI treatment at that time.  - The posttreatment whole-body scan was negative for any cancer spread.   Thyroglobulin returned at 2.3 in 08/2014 >>  I ordered a neck ultrasound that showed a stable nodule with a fatty hilum, consistent with a benign lymph node, but no other masses in the surgical bed.   The thyroglobulin level remained detectable, and was actually 9.9 in 10/2015.   A neck ultrasound (11/2015) showed a stable 1.4 nodule in the thyroid  bed, consistent with a benign process   Her thyroglobulin levels continue to be detectable  A neck ultrasound (02/2020) was negative for metastasis or recurrences  A whole-body scan (03/2020) was also negative for abnormal uptake  Reviewed her thyroglobulin and ATA antibodies: Lab Results  Component Value Date   THYROGLB 3.0 05/22/2023   THYROGLB 3.1 01/16/2023   THYROGLB 2.9 12/30/2021   THYROGLB 2.3 (L) 04/26/2021   THYROGLB 2.0 (L) 08/19/2020   THYROGLB 3.3 02/13/2020   THYROGLB 2.1 (L) 05/13/2019   THYROGLB 2.5 (L) 06/25/2018   THYROGLB 1.8 (L) 08/15/2017   THYROGLB 2.5 (L) 08/29/2016   THGAB <1 05/22/2023   THGAB <1 01/16/2023   THGAB <1 12/30/2021   THGAB <1 04/26/2021   THGAB <1 08/19/2020   THGAB <1 02/13/2020   THGAB <1 05/13/2019   THGAB <1 06/25/2018   THGAB <1 08/15/2017   THGAB <1 08/29/2016   Postsurgical hypothyroidism:  Reviewed her TFTs: 11/29/2023: TSH 0.066 Lab Results  Component Value Date   TSH 0.27 (L) 05/22/2023   TSH 0.47 01/16/2023   TSH 0.43 05/04/2022   TSH 0.16 (L) 12/30/2021   TSH 0.20 (L) 08/26/2021   FREET4 1.34 01/16/2023   FREET4 1.70 (H) 05/04/2022   FREET4 1.80 (H) 12/30/2021   FREET4 1.30 08/26/2021   FREET4 1.39 04/26/2021   Pt is on levothyroxine  125 mcg daily (I sent her a msg to decrease the dose to 112 mcg daily in 11/2023, but she did not see it...   : - fasting - at least 30 min from b'fast - no calcium - no iron - no multivitamins - no PPIs - not on Biotin  No FH of thyroid  cancer. No h/o radiation tx to head or neck other than RAI treatment. No Biotin use. No recent steroids use.   Pt denies: - feeling  nodules in neck - hoarseness - dysphagia - choking  She has a h/o irregular menses. She developed postmenopausal bleeding in 2019 >> she had D&C on 09/01/2021.  She is on vitamin D  5000 units once a week.  ROS: + see HPI  I reviewed pt's medications, allergies, PMH, social hx, family hx, and changes were documented in the history of present illness. Otherwise, unchanged from my initial visit note.  Past Medical History:  Diagnosis Date   Anemia associated with chronic renal failure    Arthritis    Chronic kidney disease (CKD), stage III (moderate) (HCC)  nephrologist-- dr s. Lamount Pimple   Diabetic retinopathy of both eyes [facility])    followed by dr c. haddad in [place];  pt stated gets eye injections every 6 months   History of COVID-19 2020   per pt mild to moderate symptoms , no hospital admission,  symptoms resolved with exceptin taste/ smell still not right   History of thyroid  cancer 09/2002   endocrinologist--- dr Aldona Amel;  s/p total thyroidectomy [id number] for multinodular goiter, dx follicular varient papillary thyroid  cancer, no chemo/ radiation;   RAI for normal bx of mass in 2005;  no recurrence   Hx MRSA infection 06/2012   right thigh abscess with sepsis, hospital admission   Hyperlipidemia    Hypertension    Hypothyroidism, postsurgical 2004   endocrinologist-  dr Aldona Amel;   10-03-2002  s/p total thyroidectomy for multinodular goiter/ thyroid  cancer   Insulin  dependent type 2 diabetes mellitus [facility])    endocrinologist--- dr Aldona Amel;    (08-27-2021 per pt does not check blood sugar at home, only every once in a while)   Neuropathy due to secondary diabetes (HCC)    PMB (postmenopausal bleeding)    Secondary hyperparathyroidism of renal origin [facility])    Wears glasses    Past Surgical History:  Procedure Laterality Date   APPLICATION OF WOUND VAC     07-11-2012 and 07-14-2012 @[facility] ;   to right thigh wound with dressing change   BILATERAL CARPAL TUNNEL RELEASE  Bilateral 2003   BREAST BIOPSY Right 07/12/2023   US  RT BREAST BX W LOC DEV 1ST LESION IMG BX SPEC US  GUIDE 07/12/2023 [facility] MAMMO[facility]   CATARACT EXTRACTION W/ INTRAOCULAR LENS IMPLANT Bilateral    left 2016;  right 2018   CHOLECYSTECTOMY OPEN     1990s   COLONOSCOPY WITH PROPOFOL  N/A 05/14/2020   Procedure: COLONOSCOPY WITH PROPOFOL ;  Surgeon: Selena Daily, MD;  Location: [facility] ENDOSCOPY;  Service: Gastroenterology;  Laterality: N/A;   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   HYSTEROSCOPY WITH D & C N/A 09/01/2021   Procedure: DILATATION AND CURETTAGE Larri Ply;  Surgeon: Abigail Abler, MD;  Location: [facility];  Service: Gynecology;  Laterality: N/A;   INCISION AND DRAINAGE ABSCESS Right 07/08/2012   @[facility] ;  w/ excisional debridement right thigh   KNEE ARTHROSCOPY Left 05/05/2016   Procedure: ARTHROSCOPY KNEE;  Surgeon: Rande Bushy, MD;  Location: [facility] ORS;  Service: Orthopedics;  Laterality: Left;   SHOULDER ARTHROSCOPY Left 03/2002   TOTAL THYROIDECTOMY Bilateral 10/03/2002   @[facility]    TRIGGER FINGER RELEASE     1990s ;   right hand   History   Social History Main Topics   Smoking status: Former Smoker   Smokeless tobacco: Not on file   Alcohol Use: No   Drug Use: No   Social History Narrative   Single   0 children   Sales executive (travels regularly)      Beer and wine on occasion   First menstrual cycle: 6th grade   Postmenopausal      Current Outpatient Medications  Medication Sig Dispense Refill   Acetaminophen  (TYLENOL ) 325 MG CAPS Take by mouth as needed.     allopurinol (ZYLOPRIM) 100 MG tablet Take 100 mg by mouth daily.     amLODipine (NORVASC) 5 MG tablet Take 5 mg by mouth daily.     Cholecalciferol 125 MCG (5000 UT) capsule Take 5,000 Units by mouth once a week.     Continuous Blood Gluc Receiver (FREESTYLE  LIBRE 2 READER) DEVI 1 each by Does not apply route daily. 1 each 0   Continuous Glucose Sensor (FREESTYLE  LIBRE 2 SENSOR) MISC 1 each by Does not apply route every 14 (fourteen) days. E11.65 6 each 3   diphenhydrAMINE (BENADRYL) 50 MG tablet Take 50 mg by mouth as needed.     empagliflozin (JARDIANCE) 10 MG TABS tablet Take 10 mg by mouth daily.     gabapentin (NEURONTIN) 100 MG capsule TAKE 1 TO 3 CAPSULES BY MOUTH NIGHTLY FOR NEUROPATHY PAIN     insulin  aspart (FIASP  FLEXTOUCH) 100 UNIT/ML FlexTouch Pen Inject 10-15 Units into the skin daily before breakfast. 15 mL 3   Insulin  Pen Needle 32G X 4 MM MISC Use 3x a day 200 each 3   insulin  regular (NOVOLIN  R) 100 units/mL injection Inject up to 60 units under skin as advised (Patient not taking: Reported on 09/21/2023) 60 mL 1   Insulin  Syringe-Needle U-100 31G X 15/64" 0.5 ML MISC Use 3x a day 300 each 3   levothyroxine  (SYNTHROID ) 125 MCG tablet Take 1 tablet (125 mcg total) by mouth daily before breakfast. 90 tablet 3   lovastatin (MEVACOR) 20 MG tablet Take 20 mg by mouth daily.     medroxyPROGESTERone  (PROVERA ) 10 MG tablet Take 1 tablet (10 mg total) by mouth daily. 30 tablet 11   Polyvinyl Alcohol-Povidone (REFRESH OP) Apply to eye as needed.     Semaglutide , 1 MG/DOSE, (OZEMPIC , 1 MG/DOSE,) 4 MG/3ML SOPN INJECT 1MG  SUBCUTANEOUSLY ONCE WEEKLY 9 mL 2   terconazole  (TERAZOL 7 ) 0.4 % vaginal cream Place 1 applicator vaginally at bedtime. 45 g 0   torsemide (DEMADEX) 20 MG tablet Take 20 mg by mouth daily.     TRESIBA  FLEXTOUCH 200 UNIT/ML FlexTouch Pen Inject 70 Units into the skin daily. INJECT 70 UNITS SUBCUTANEOUSLY ONCE DAILY (Patient taking differently: Inject 50 Units into the skin daily. INJECT 70 UNITS SUBCUTANEOUSLY ONCE DAILY) 30 mL 2   No current facility-administered medications for this visit.   NKDA   Family History  Problem Relation Age of Onset   Hypertension Mother    Thyroid  disease Mother    CVA Mother    Hypertension Sister    Thyroid  disease Sister    Hypertension Brother    Hyperlipidemia Brother    PE:  BP 120/70    Pulse 83   Ht 5' 7.5" (1.715 m)   Wt 229 lb 6.4 oz (104.1 kg)   SpO2 97%   BMI 35.40 kg/m  Wt Readings from Last 3 Encounters:  01/23/24 229 lb 6.4 oz (104.1 kg)  10/09/23 234 lb 14.4 oz (106.5 kg)  09/21/23 234 lb 12.8 oz (106.5 kg)   Constitutional: overweight, in NAD Eyes: no exophthalmos ENT: no masses palpated in neck, no cervical lymphadenopathy Cardiovascular: RRR, No MRG Respiratory: CTA B Musculoskeletal: no deformities Skin:  no rashes Neurological: no tremor with outstretched hands  ASSESSMENT: 1. DM2, insulin -dependent, uncontrolled, with complications - CKD - DR  She does not have a family history of medullary thyroid  cancer a personal history of pancreatitis.  2. 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 She did not have RAI treatment at that time, however, she was found to have a thyroid  mass in 2005. This was biopsied and returned as normal thyroid  tissue.She had RAI treatment at that time. The posttreatment whole-body  scan was negative for any cancers.  - Neck U/S (09/19/2014): 1. Post total thyroidectomy. 2. No change in the previously biopsied approximately 1.4 cm echogenic solid nodule within the inferior aspect of the right lobe of the thyroid , grossly unchanged since the 2005 examination. 3. Apparent development of an approximately 0.4 cm hypoechoic nodule within the right thyroidectomy bed - while too small for definitive characterization, this nodule appears to contain an echogenic hilum and thus is favored to represent a non pathologically enlarged cervical lymph node.  - Neck U/S (11/27/2015):  1. Surgical changes of prior total thyroidectomy. 2. Unchanged 1.4 cm echogenic soft tissue nodule in the right thyroid  resection bed. Continued stability over time consistent with a benign  process.  - Neck U/S (03/02/2020): Stable surgical changes following total thyroidectomy. No residual thyroid  bed abnormality.  - WBS (03/27/2020): her copay: 6800$!!  No evidence of local thyroid  cancer recurrence or distant metastasis by I 131 imaging.   3. Post surgical hypothyroidism  PLAN:  1. Patient with longstanding, uncontrolled, type 2 diabetes, on basal-bolus insulin  regimen along with weekly GLP-1 receptor agonist and daily SGLT2 inhibitor, with still suboptimal control.  At last visit, HbA1c was 7.3%, increased.  We did not change her regimen but I strongly advised her to stop sweet drinks.  She was drinking ginger ale and sweet tea.  Sugars were fluctuating mainly within the target range but with a larger coefficient of variation compared to the previous visit. CGM interpretation: -At today's visit, we reviewed her CGM downloads: It appears that 85% of values are in target range (goal >70%), while 10% are higher than 180 (goal <25%), and 5% are lower than 70 (goal <4%).  The calculated average blood sugar is 123.  The projected HbA1c for the next 3 months (GMI) is 6.3%. -Reviewing the CGM trends, sugars are mostly fluctuating within the target range with occasional hyperglycemic spikes after meals but more consistent high blood sugars after dinner.  Upon questioning, she is sometimes taking Fiasp  too late after the meal, we discussed about taking it before the meal even if the sugars are at goal.  This can help avoiding late postprandial hypoglycemia after the meals.  However, since she has low blood sugars throughout the day and night, I also advised her to reduce the dose of her Tresiba .  We can continue the rest of the regimen.  I again underlined the importance of stopping sweet drinks, which she is still drinking. - I advised her to: Patient Instructions  Please continue: - Jardiance 10 mg before b'fast - Ozempic  1 mg weekly in a.m.  - FiAsp  10-15 units before b'fast  Please  decrease:  - Tresiba  42 units in am  STOP GINGER ALE AND SWEET TEA!  Please continue levothyroxine  125 mcg daily.  Take the thyroid  hormone every day, with water, at least 30 minutes before breakfast, separated by at least 4 hours from: - acid reflux medications - calcium - iron - multivitamins  Please stop at the lab.  Please return in 4 months.  - we checked her HbA1c: 7.1% (lower) - advised to check sugars at different times of the day - 4x a day, rotating check times - advised for yearly eye exams >> she is UTD - return to clinic in 4 months       2. 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  She has persistently detectable thyroglobulin. -I reviewed the results of  her neck ultrasound from 11/2015, which showed a stable mass, consistent with a benign process.  A repeat neck ultrasound in 02/2020 showed stable surgical changes with no residual thyroid  abnormalities.  We checked a whole-body scan in 03/2020 and this did not show any evidence of local recurrence or metastasis.  However, this was a very expensive test and she had to pay approximately $7000 for it.  As a consequence, she declined further imaging studies. - As mentioned above, her thyroglobulin level remains detectable, but not much fluctuating in the last 8 years.  Last level was 3.0 in 05/2023, previously 3.1 in 01/2023. We did discuss about possibly obtaining a PET scan and a neck CT scan but due to her high co-pay, she prefers to only be followed by thyroglobulin - No neck compression symptoms - Will check the thyroglobulin level at next OV  3. Postsurgical hypothyroidism - latest thyroid  labs reviewed with pt. >> this was obtained on 11/29/2023 and the TSH was quite suppressed, at 0.0 66 - at that time,  I advised her her LT4 dose from 125 mcg to 112 mcg daily, but she did not see the MyChart msg (as her computer crashed) >> still  on 125 mcg daily - we discussed about taking the thyroid  hormone every day, with water, >30 minutes before breakfast, separated by >4 hours from acid reflux medications, calcium, iron, multivitamins. Pt. is taking it correctly. - will check thyroid  tests today: TSH and fT4 - If labs are abnormal, she will need to return for repeat TFTs in 1.5 months  Orders Placed This Encounter  Procedures   TSH   T4, free   POCT glycosylated hemoglobin (Hb A1C)   Office Visit on 01/23/2024  Component Date Value Ref Range Status   TSH 01/23/2024 0.07 (L)  0.40 - 4.50 mIU/L Final   Free T4 01/23/2024 1.8  0.8 - 1.8 ng/dL Final   Hemoglobin Z6X 01/23/2024 7.1 (A)  4.0 - 5.6 % Final   TSH is still suppressed >> will decrease the LT4 dose to 112 mcg daily and have her back for labs in 1.5 mo.  Emilie Harden, MD PhD [facility]

## 2024-01-24 ENCOUNTER — Ambulatory Visit: Payer: Self-pay | Admitting: Internal Medicine

## 2024-01-24 MED ORDER — LEVOTHYROXINE SODIUM 112 MCG PO TABS: 112.0000 ug | ORAL_TABLET | Freq: Every day | ORAL | 3 refills | Status: DC

## 2024-01-24 NOTE — Addendum Note (Signed)
 Addended by: Emilie Harden on: 01/24/2024 11:26 AM   Modules accepted: Orders

## 2024-01-29 DIAGNOSIS — E113511 Type 2 diabetes mellitus with proliferative diabetic retinopathy with macular edema, right eye: Secondary | ICD-10-CM | POA: Diagnosis not present

## 2024-02-07 ENCOUNTER — Other Ambulatory Visit: Payer: Self-pay | Admitting: Physician Assistant

## 2024-02-07 DIAGNOSIS — R928 Other abnormal and inconclusive findings on diagnostic imaging of breast: Secondary | ICD-10-CM

## 2024-02-19 ENCOUNTER — Other Ambulatory Visit: Payer: Self-pay | Admitting: Physician Assistant

## 2024-02-19 DIAGNOSIS — E1122 Type 2 diabetes mellitus with diabetic chronic kidney disease: Secondary | ICD-10-CM | POA: Diagnosis not present

## 2024-02-19 DIAGNOSIS — I129 Hypertensive chronic kidney disease with stage 1 through stage 4 chronic kidney disease, or unspecified chronic kidney disease: Secondary | ICD-10-CM | POA: Diagnosis not present

## 2024-02-19 DIAGNOSIS — N2581 Secondary hyperparathyroidism of renal origin: Secondary | ICD-10-CM | POA: Diagnosis not present

## 2024-02-19 DIAGNOSIS — N2889 Other specified disorders of kidney and ureter: Secondary | ICD-10-CM | POA: Diagnosis not present

## 2024-02-19 DIAGNOSIS — N6310 Unspecified lump in the right breast, unspecified quadrant: Secondary | ICD-10-CM

## 2024-02-19 DIAGNOSIS — R809 Proteinuria, unspecified: Secondary | ICD-10-CM | POA: Diagnosis not present

## 2024-02-19 DIAGNOSIS — D631 Anemia in chronic kidney disease: Secondary | ICD-10-CM | POA: Diagnosis not present

## 2024-02-19 DIAGNOSIS — N1832 Chronic kidney disease, stage 3b: Secondary | ICD-10-CM | POA: Diagnosis not present

## 2024-02-26 ENCOUNTER — Ambulatory Visit
Admission: RE | Admit: 2024-02-26 | Discharge: 2024-02-26 | Disposition: A | Discharge status: Home / Self Care | Source: Ambulatory Visit | Attending: Physician Assistant | Admitting: Physician Assistant

## 2024-02-26 DIAGNOSIS — N6310 Unspecified lump in the right breast, unspecified quadrant: Secondary | ICD-10-CM

## 2024-02-26 DIAGNOSIS — N6315 Unspecified lump in the right breast, overlapping quadrants: Secondary | ICD-10-CM | POA: Diagnosis not present

## 2024-02-28 DIAGNOSIS — E1122 Type 2 diabetes mellitus with diabetic chronic kidney disease: Secondary | ICD-10-CM | POA: Diagnosis not present

## 2024-02-28 DIAGNOSIS — D631 Anemia in chronic kidney disease: Secondary | ICD-10-CM | POA: Diagnosis not present

## 2024-02-28 DIAGNOSIS — N2581 Secondary hyperparathyroidism of renal origin: Secondary | ICD-10-CM | POA: Diagnosis not present

## 2024-02-28 DIAGNOSIS — R809 Proteinuria, unspecified: Secondary | ICD-10-CM | POA: Diagnosis not present

## 2024-02-28 DIAGNOSIS — N1832 Chronic kidney disease, stage 3b: Secondary | ICD-10-CM | POA: Diagnosis not present

## 2024-02-28 DIAGNOSIS — I129 Hypertensive chronic kidney disease with stage 1 through stage 4 chronic kidney disease, or unspecified chronic kidney disease: Secondary | ICD-10-CM | POA: Diagnosis not present

## 2024-03-11 ENCOUNTER — Other Ambulatory Visit: Payer: Self-pay

## 2024-03-11 DIAGNOSIS — C73 Malignant neoplasm of thyroid gland: Secondary | ICD-10-CM

## 2024-03-18 ENCOUNTER — Other Ambulatory Visit

## 2024-03-18 DIAGNOSIS — C73 Malignant neoplasm of thyroid gland: Secondary | ICD-10-CM | POA: Diagnosis not present

## 2024-03-18 LAB — TSH: TSH: 0.41 m[IU]/L (ref 0.40–4.50)

## 2024-03-18 LAB — T4, FREE: Free T4: 1.5 ng/dL (ref 0.8–1.8)

## 2024-03-19 ENCOUNTER — Ambulatory Visit: Payer: Self-pay | Admitting: Internal Medicine

## 2024-04-01 DIAGNOSIS — E113511 Type 2 diabetes mellitus with proliferative diabetic retinopathy with macular edema, right eye: Secondary | ICD-10-CM | POA: Diagnosis not present

## 2024-04-18 ENCOUNTER — Other Ambulatory Visit: Payer: Self-pay | Admitting: Internal Medicine

## 2024-05-15 DIAGNOSIS — E113591 Type 2 diabetes mellitus with proliferative diabetic retinopathy without macular edema, right eye: Secondary | ICD-10-CM | POA: Diagnosis not present

## 2024-06-04 DIAGNOSIS — M109 Gout, unspecified: Secondary | ICD-10-CM | POA: Diagnosis not present

## 2024-06-04 DIAGNOSIS — E559 Vitamin D deficiency, unspecified: Secondary | ICD-10-CM | POA: Diagnosis not present

## 2024-06-04 DIAGNOSIS — E1122 Type 2 diabetes mellitus with diabetic chronic kidney disease: Secondary | ICD-10-CM | POA: Diagnosis not present

## 2024-06-04 DIAGNOSIS — E785 Hyperlipidemia, unspecified: Secondary | ICD-10-CM | POA: Diagnosis not present

## 2024-06-04 DIAGNOSIS — E890 Postprocedural hypothyroidism: Secondary | ICD-10-CM | POA: Diagnosis not present

## 2024-06-04 DIAGNOSIS — I10 Essential (primary) hypertension: Secondary | ICD-10-CM | POA: Diagnosis not present

## 2024-06-04 DIAGNOSIS — N183 Chronic kidney disease, stage 3 unspecified: Secondary | ICD-10-CM | POA: Diagnosis not present

## 2024-06-05 ENCOUNTER — Other Ambulatory Visit: Payer: Self-pay | Admitting: Physician Assistant

## 2024-06-05 ENCOUNTER — Ambulatory Visit: Admitting: Internal Medicine

## 2024-06-05 ENCOUNTER — Encounter: Payer: Self-pay | Admitting: Internal Medicine

## 2024-06-05 VITALS — BP 120/64 | HR 84 | Ht 67.5 in | Wt 232.4 lb

## 2024-06-05 DIAGNOSIS — E1142 Type 2 diabetes mellitus with diabetic polyneuropathy: Secondary | ICD-10-CM

## 2024-06-05 DIAGNOSIS — E7849 Other hyperlipidemia: Secondary | ICD-10-CM

## 2024-06-05 DIAGNOSIS — Z1231 Encounter for screening mammogram for malignant neoplasm of breast: Secondary | ICD-10-CM

## 2024-06-05 DIAGNOSIS — E890 Postprocedural hypothyroidism: Secondary | ICD-10-CM

## 2024-06-05 DIAGNOSIS — E1165 Type 2 diabetes mellitus with hyperglycemia: Secondary | ICD-10-CM | POA: Diagnosis not present

## 2024-06-05 DIAGNOSIS — C73 Malignant neoplasm of thyroid gland: Secondary | ICD-10-CM | POA: Diagnosis not present

## 2024-06-05 LAB — POCT GLYCOSYLATED HEMOGLOBIN (HGB A1C): Hemoglobin A1C: 7.6 % — AB (ref 4.0–5.6)

## 2024-06-05 NOTE — Addendum Note (Signed)
 Addended by: CLEOTILDE ROLIN RAMAN on: 06/05/2024 11:07 AM   Modules accepted: Orders

## 2024-06-05 NOTE — Patient Instructions (Addendum)
 Please continue: - Jardiance 10 mg before b'fast - Ozempic  1 mg weekly in a.m.  - FiAsp  10-15 units before b'fast but also use this before the rest of the meals - Tresiba  42 units in am  STOP GINGER ALE AND SWEET TEA!  Please continue levothyroxine  112 mcg daily.  Take the thyroid  hormone every day, with water, at least 30 minutes before breakfast, separated by at least 4 hours from: - acid reflux medications - calcium - iron - multivitamins  Please stop at the lab.  Please return in 4 months.

## 2024-06-05 NOTE — Progress Notes (Signed)
 Patient ID: Courtney [name], female   DOB: [birth year], 68 y.o.   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 Last visit 4 months ago. PCP: [facility] [place], [place].  Interim history: No blurry vision, nausea, chest pain. She has L eye blurry vision. She will see a cornea specialist. She also has PN >> will increase gabapentin per advice from PCP.  She will have a sleep study and a colonoscopy. She still drinks sweet drinks -sweet tea and ginger ale every day.  DM2: Reviewed HbA1c levels: Lab Results  Component Value Date   HGBA1C 7.1 (A) 01/23/2024   HGBA1C 7.3 (A) 09/21/2023   HGBA1C 7.1 (A) 05/22/2023   HGBA1C 6.9 (A) 01/16/2023   HGBA1C 6.8 (A) 09/09/2022   HGBA1C 7.1 (A) 05/04/2022   HGBA1C 7.8 (A) 08/26/2021   HGBA1C 7.0 (A) 04/26/2021   HGBA1C 6.9 (A) 12/23/2020   HGBA1C 6.8 (A) 08/19/2020   HGBA1C 7.8 (A) 05/19/2020   HGBA1C 10.7 (A) 02/13/2020   HGBA1C 12.6 (A) 11/13/2019   HGBA1C 9.8 (A) 05/13/2019   HGBA1C 13.7 (A) 10/23/2018   HGBA1C 10.1 (A) 06/25/2018   HGBA1C 8.6 (A) 02/14/2018   HGBA1C 8.3 11/13/2017   HGBA1C 9.1 08/16/2017   HGBA1C 11.3 05/31/2017   HGBA1C 11.9 02/24/2017   HGBA1C 9.5 11/25/2016   HGBA1C 8.2 (H) 04/13/2016   HGBA1C 9.1 02/05/2016   HGBA1C 9.8 (H) 09/18/2015   HGBA1C 9.9 06/19/2015   HGBA1C 11.0 03/27/2015   HGBA1C 10.9 (H) 12/26/2014  08/22/2016: HbA1c 11.8% 08/01/2014: HbA1c 14.5% 05/02/2014: HbA1c 14.8% 01/31/2014: HbA1c 13.6% 11/01/2013: HbA1c 15.1%  She is on: - Tresiba  70 >> 60 >> 50 >> 42 units in a.m. - Novolin  Regular insulin  15-18 units before b'fast  >> FiAsp  10-15 units before b''fast - Ozempic  0.5 >> 1 mg weekly  - Jardiance 10 mg daily - started by Dr. Douglas 01/2021 Stopped Metformin  500 mg 2x  a day - b/c leg swelling. She was on Novolin  70/30 48 units 2x a day 15 min before the meals  She was on Amaryl 8 mg in am She was on Metformin  >> stopped 2/2 CKD. She was previously on [place] . She was previously on [place].  She checks sugars >4x a day with the Libre CGM - receiver (from [place]):  Previously:  Previously:   Lowest sugar was 50s >> 56 (at night - compression low) >> 50s >> 67; it is unclear at which level she has hypoglycemia Highest sugar was 325 >> ... 284 >> 200s >> 300 x1.  Glucometer: Prodigy >> ReliOn  Pt's meals are: - Breakfast: chicken  - Lunch: peanuts, sandwich + crystal lite drink - Dinner: frozen dinner - Snacks: fruit, small bag potato chips, peanuts She is not having regular meals.  She continues to drink sweet tea.  -+ Stage IIIb CKD -seeing nephrology. 02/19/2024: 16/1.72, GFR 32, glucose 115 01/10/2024: PCR 417 (<184) 11/29/2023: 13/1.55, GFR 36, glucose 60 06/28/2023: 18/1.72, GFR 32, PCR 433 (<184) Lab Results  Component Value Date   BUN 15 09/01/2021   Lab Results  Component Value Date   CREATININE 1.33 (H) 09/01/2021  On Cozaar.  -+ HL; lipids: 11/29/2023: 133/70/68/51 Lab Results  Component Value Date   CHOL 143 08/26/2021   HDL 77.60 08/26/2021   LDLCALC 51 08/26/2021  TRIG 70.0 08/26/2021   CHOLHDL 2 08/26/2021  On lovastatin 20.  - last eye exam was on 01/09/2024: + DR; she had laser surgeries and is back on intraocular injections for macular edema.  She had left eye cataract surgery.   - She has numbness and tingling in her right foot -sees Dr. Ashley.  Last  foot exam was here in the clinic 09/21/2023.  On gabapentin by PCP.  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  - She did not have RAI treatment at that time, however,  she was found to have a thyroid  mass in 2005 >> This was biopsied and returned as normal thyroid  tissue, but had RAI treatment at that time.  - The posttreatment whole-body scan was negative for any cancer spread.   Thyroglobulin returned at 2.3 in 08/2014 >> I ordered a neck ultrasound that showed a stable nodule with a fatty hilum, consistent with a benign lymph node, but no other masses in the surgical bed.   The thyroglobulin level remained detectable, and was actually 9.9 in 10/2015.   A neck ultrasound (11/2015) showed a stable 1.4 nodule in the thyroid  bed, consistent with a benign process   Her thyroglobulin levels continue to be detectable  A neck ultrasound (02/2020) was negative for metastasis or recurrences  A whole-body scan (03/2020) was also negative for abnormal uptake  Reviewed her thyroglobulin and ATA antibodies: Lab Results  Component Value Date   THYROGLB 3.0 05/22/2023   THYROGLB 3.1 01/16/2023   THYROGLB 2.9 12/30/2021   THYROGLB 2.3 (L) 04/26/2021   THYROGLB 2.0 (L) 08/19/2020   THYROGLB 3.3 02/13/2020   THYROGLB 2.1 (L) 05/13/2019   THYROGLB 2.5 (L) 06/25/2018   THYROGLB 1.8 (L) 08/15/2017   THYROGLB 2.5 (L) 08/29/2016   THGAB <1 05/22/2023   THGAB <1 01/16/2023   THGAB <1 12/30/2021   THGAB <1 04/26/2021   THGAB <1 08/19/2020   THGAB <1 02/13/2020   THGAB <1 05/13/2019   THGAB <1 06/25/2018   THGAB <1 08/15/2017   THGAB <1 08/29/2016   Postsurgical hypothyroidism:  Reviewed her TFTs: Lab Results  Component Value Date   TSH 0.41 03/18/2024   TSH 0.07 (L) 01/23/2024   TSH 0.27 (L) 05/22/2023   TSH 0.47 01/16/2023   TSH 0.43 05/04/2022   FREET4 1.5 03/18/2024   FREET4 1.8 01/23/2024   FREET4 1.34 01/16/2023   FREET4 1.70 (H) 05/04/2022   FREET4 1.80 (H) 12/30/2021  11/29/2023: TSH 0.066  Pt is on levothyroxine  112 mcg daily (dose decreased 01/2024): - fasting - at least 30 min from b'fast - no calcium - no iron - no multivitamins -  no PPIs - not on Biotin  No FH of thyroid  cancer. No h/o radiation tx to head or neck other than RAI treatment. No Biotin use. No recent steroids use.   Pt denies: - feeling nodules in neck - hoarseness - dysphagia - choking  She has a h/o irregular menses. She developed postmenopausal bleeding in 2019 >> she had D&C on 09/01/2021.  She is on vitamin D  5000 units once a week.  ROS: + see HPI  I reviewed pt's medications, allergies, PMH, social hx, family hx, and changes were documented in the history of present illness. Otherwise, unchanged from my initial visit note.  Past Medical  History:  Diagnosis Date   Anemia associated with chronic renal failure    Arthritis    Chronic kidney disease (CKD), stage III (moderate) (HCC)    nephrologist-- dr s. douglas   Diabetic retinopathy of both eyes (HCC)    followed by dr c. haddad in [place];  pt stated gets eye injections every 6 months   History of COVID-19 2020   per pt mild to moderate symptoms , no hospital admission,  symptoms resolved with exceptin taste/ smell still not right   History of thyroid  cancer 09/2002   endocrinologist--- dr trixie;  s/p total thyroidectomy [id number] for multinodular goiter, dx follicular varient papillary thyroid  cancer, no chemo/ radiation;   RAI for normal bx of mass in 2005;  no recurrence   Hx MRSA infection 06/2012   right thigh abscess with sepsis, hospital admission   Hyperlipidemia    Hypertension    Hypothyroidism, postsurgical 2004   endocrinologist-  dr trixie;   10-03-2002  s/p total thyroidectomy for multinodular goiter/ thyroid  cancer   Insulin  dependent type 2 diabetes mellitus [facility])    endocrinologist--- dr trixie;    (08-27-2021 per pt does not check blood sugar at home, only every once in a while)   Neuropathy due to secondary diabetes (HCC)    PMB (postmenopausal bleeding)    Secondary hyperparathyroidism of renal origin    Wears glasses    Past Surgical History:   Procedure Laterality Date   APPLICATION OF WOUND VAC     07-11-2012 and 07-14-2012 @[facility] ;   to right thigh wound with dressing change   BILATERAL CARPAL TUNNEL RELEASE Bilateral 2003   BREAST BIOPSY Right 07/12/2023   US  RT BREAST BX W LOC DEV 1ST LESION IMG BX SPEC US  GUIDE 07/12/2023 [facility]   CATARACT EXTRACTION W/ INTRAOCULAR LENS IMPLANT Bilateral    left 2016;  right 2018   CHOLECYSTECTOMY OPEN     1990s   COLONOSCOPY WITH PROPOFOL  N/A 05/14/2020   Procedure: COLONOSCOPY WITH PROPOFOL ;  Surgeon: Unk Corinn Skiff, MD;  Location: [facility] ENDOSCOPY;  Service: Gastroenterology;  Laterality: N/A;   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   HYSTEROSCOPY WITH D & C N/A 09/01/2021   Procedure: DILATATION AND CURETTAGE LELDON NAIL;  Surgeon: Zina Jerilynn LABOR, MD;  Location: [facility];  Service: Gynecology;  Laterality: N/A;   INCISION AND DRAINAGE ABSCESS Right 07/08/2012   @[facility] ;  w/ excisional debridement right thigh   KNEE ARTHROSCOPY Left 05/05/2016   Procedure: ARTHROSCOPY KNEE;  Surgeon: Franky Cranker, MD;  Location: [facility] ORS;  Service: Orthopedics;  Laterality: Left;   SHOULDER ARTHROSCOPY Left 03/2002   TOTAL THYROIDECTOMY Bilateral 10/03/2002   @[facility]    TRIGGER FINGER RELEASE     1990s ;   right hand   History   Social History Main Topics   Smoking status: Former Smoker   Smokeless tobacco: Not on file   Alcohol Use: No   Drug Use: No   Social History Narrative   Single   0 children   Sales executive (travels regularly)      Beer and wine on occasion   First menstrual cycle: 6th grade   Postmenopausal      Current Outpatient Medications  Medication Sig Dispense Refill   Acetaminophen  (TYLENOL ) 325 MG CAPS Take by mouth as needed.     allopurinol (ZYLOPRIM) 100 MG tablet Take 100 mg by mouth daily.     amLODipine (NORVASC) 5 MG tablet Take 5 mg by  mouth daily.     BD PEN NEEDLE NANO 2ND GEN 32G X 4 MM MISC USE WITH INSULIN  THREE  TIMES DAILY 300 each 5   Cholecalciferol 125 MCG (5000 UT) capsule Take 5,000 Units by mouth once a week.     Continuous Blood Gluc Receiver (FREESTYLE LIBRE 2 READER) DEVI 1 each by Does not apply route daily. 1 each 0   Continuous Glucose Sensor (FREESTYLE LIBRE 2 SENSOR) MISC 1 each by Does not apply route every 14 (fourteen) days. E11.65 6 each 3   diphenhydrAMINE (BENADRYL) 50 MG tablet Take 50 mg by mouth as needed.     empagliflozin (JARDIANCE) 10 MG TABS tablet Take 10 mg by mouth daily.     gabapentin (NEURONTIN) 100 MG capsule TAKE 1 TO 3 CAPSULES BY MOUTH NIGHTLY FOR NEUROPATHY PAIN     insulin  aspart (FIASP  FLEXTOUCH) 100 UNIT/ML FlexTouch Pen Inject 10-15 Units into the skin daily before breakfast. 15 mL 3   Insulin  Syringe-Needle U-100 31G X 15/64 0.5 ML MISC Use 3x a day 300 each 3   levothyroxine  (SYNTHROID ) 112 MCG tablet Take 1 tablet (112 mcg total) by mouth daily. 45 tablet 3   lovastatin (MEVACOR) 20 MG tablet Take 20 mg by mouth daily.     medroxyPROGESTERone  (PROVERA ) 10 MG tablet Take 1 tablet (10 mg total) by mouth daily. 30 tablet 11   Polyvinyl Alcohol-Povidone (REFRESH OP) Apply to eye as needed.     Semaglutide , 1 MG/DOSE, (OZEMPIC , 1 MG/DOSE,) 4 MG/3ML SOPN INJECT 1MG  SUBCUTANEOUSLY ONCE WEEKLY 9 mL 3   terconazole  (TERAZOL 7 ) 0.4 % vaginal cream Place 1 applicator vaginally at bedtime. 45 g 0   torsemide (DEMADEX) 20 MG tablet Take 20 mg by mouth daily.     TRESIBA  FLEXTOUCH 200 UNIT/ML FlexTouch Pen Inject 42 Units into the skin daily. INJECT 70 UNITS SUBCUTANEOUSLY ONCE DAILY     No current facility-administered medications for this visit.   NKDA   Family History  Problem Relation Age of Onset   Hypertension Mother    Thyroid  disease Mother    CVA Mother    Hypertension Sister    Thyroid  disease Sister    Hypertension Brother    Hyperlipidemia Brother    PE:  BP 120/64   Pulse 84   Ht 5' 7.5 (1.715 m)   Wt 232 lb 6.4 oz (105.4 kg)   SpO2 96%    BMI 35.86 kg/m  Wt Readings from Last 3 Encounters:  06/05/24 232 lb 6.4 oz (105.4 kg)  01/23/24 229 lb 6.4 oz (104.1 kg)  10/09/23 234 lb 14.4 oz (106.5 kg)   Constitutional: overweight, in NAD Eyes: no exophthalmos ENT: no masses palpated in neck, no cervical lymphadenopathy Cardiovascular: RRR, No MRG Respiratory: CTA B Musculoskeletal: no deformities Skin:  no rashes Neurological: no tremor with outstretched hands  ASSESSMENT: 1. DM2, insulin -dependent, uncontrolled, with complications - CKD - DR  She does not have a family history of medullary thyroid  cancer a personal history of pancreatitis.  2. 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 She did not have RAI treatment at that time, however, she was found to have a thyroid  mass in 2005. This was biopsied and returned as normal thyroid  tissue.She had RAI treatment at that time. The posttreatment whole-body scan was negative for any cancers.  - Neck U/S (09/19/2014): 1.  Post total thyroidectomy. 2. No change in the previously biopsied approximately 1.4 cm echogenic solid nodule within the inferior aspect of the right lobe of the thyroid , grossly unchanged since the 2005 examination. 3. Apparent development of an approximately 0.4 cm hypoechoic nodule within the right thyroidectomy bed - while too small for definitive characterization, this nodule appears to contain an echogenic hilum and thus is favored to represent a non pathologically enlarged cervical lymph node.  - Neck U/S (11/27/2015):  1. Surgical changes of prior total thyroidectomy. 2. Unchanged 1.4 cm echogenic soft tissue nodule in the right thyroid  resection bed. Continued stability over time consistent with a benign process.  - Neck U/S (03/02/2020): Stable surgical changes following total  thyroidectomy. No residual thyroid  bed abnormality.  - WBS (03/27/2020): her copay: 6800$!!  No evidence of local thyroid  cancer recurrence or distant metastasis by I 131 imaging.   3. Post surgical hypothyroidism  PLAN:  1. Patient with longstanding, uncontrolled, type 2 diabetes, basal-bolus insulin  regimen along with weekly GLP-1 receptor agonist and daily SGLT2 inhibitor, with still suboptimal control.  At last visit, HbA1c was better, at 7.1% and sugars were fluctuating mostly within the target range with only occasional hyperglycemic spikes after meals, and more consistently high blood sugars after dinner.  Upon questioning, she was sometimes taking Fiasp  too late, after the meal and we discussed about taking it before the meal even if the sugars were at goal.  Since she had occasional low blood sugars throughout the day and night, I advised her to reduce the dose of Tresiba .  I also underlined the importance of stopping sweet drinks, which she was still drinking. CGM interpretation: -At today's visit, we reviewed her CGM downloads: It appears that 76% of values are in target range (goal >70%), while 24% are higher than 180 (goal <25%), and 0% are lower than 70 (goal <4%).  The calculated average blood sugar is 153.  The projected HbA1c for the next 3 months (GMI) is 7.0%. -Reviewing the CGM trends, sugars appear to be higher than before, increasing after every meal and improving overnight.  Upon questioning, she was not able to stop or even reduce sodas and sweet tea.  We discussed that this is absolutely mandatory for her to be able to improve her blood sugars.  She is willing to try again to reduce or stop these.  For now, since the sugars are high after every meal, I advised her to try to take Fiasp  not only for breakfast but before the rest of the meals, also.  I am hoping that we can start reducing this when she is able to stop the sweet drinks.  We also discussed about fruit and the best way  to eat them depending on the glycemic index. - I advised her to: Patient Instructions  Please continue: - Jardiance 10 mg before b'fast - Ozempic  1 mg weekly in a.m.  - FiAsp  10-15 units before b'fast but also use this before the rest of the meals - Tresiba  42 units in am  STOP GINGER ALE AND SWEET TEA!  Please continue levothyroxine  112 mcg daily.  Take the thyroid  hormone every day, with water, at least 30 minutes before breakfast, separated by at least 4 hours from: - acid reflux medications - calcium - iron - multivitamins  Please stop at the lab.  Please return in 4 months.  - we checked her HbA1c: 7.6% (higher) - advised to check sugars at different times of the day - 4x  a day, rotating check times - advised for yearly eye exams >> she is UTD - return to clinic in 4 months       2. 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  She has persistently detectable thyroglobulin. -I reviewed the results of her neck ultrasound from 11/2015, which showed a stable mass, consistent with a benign process.  A repeat neck ultrasound in 02/2020 showed stable surgical changes with no residual thyroid  abnormalities.  We checked a whole-body scan in 03/2020 and this did not show any evidence of local recurrence or metastasis.  However, this was a very expensive test and she had to pay approximately $7000 for it.  As a consequence, she declined further imaging studies. - As mentioned above, her thyroglobulin level remains detectable, but not much fluctuating in the last 8 years.  Latest level was 3.0 in 05/2023, previously 3.1 in 01/2023. We did discuss about possibly obtaining a PET scan and a neck CT scan but due to her high co-pay, she prefers to only be followed by thyroglobulin - No neck compression symptoms - Will check the thyroglobulin level and ATA now  3. Postsurgical hypothyroidism - latest thyroid   labs reviewed with pt. >> normal: Lab Results  Component Value Date   TSH 0.41 03/18/2024  - she continues on LT4 112 mcg daily, dose decreased at last visit - pt feels good on this dose. - we discussed about taking the thyroid  hormone every day, with water, >30 minutes before breakfast, separated by >4 hours from acid reflux medications, calcium, iron, multivitamins. Pt. is taking it correctly. - will check her TSH today - If labs are abnormal, she will need to return for repeat TFTs in 1.5 months  Orders Placed This Encounter  Procedures   TSH   Thyroglobulin Level   Thyroglobulin antibody   Lela Fendt, MD PhD [facility]

## 2024-06-06 LAB — THYROGLOBULIN LEVEL: Thyroglobulin: 4.1 ng/mL

## 2024-06-06 LAB — TSH: TSH: 0.36 m[IU]/L — ABNORMAL LOW (ref 0.40–4.50)

## 2024-06-06 LAB — THYROGLOBULIN ANTIBODY: Thyroglobulin Ab: <1 [IU]/mL (ref <=1)

## 2024-06-07 ENCOUNTER — Ambulatory Visit: Payer: Self-pay | Admitting: Internal Medicine

## 2024-06-12 DIAGNOSIS — G4730 Sleep apnea, unspecified: Secondary | ICD-10-CM | POA: Diagnosis not present

## 2024-07-02 ENCOUNTER — Ambulatory Visit
Admission: RE | Admit: 2024-07-02 | Discharge: 2024-07-02 | Disposition: A | Source: Ambulatory Visit | Attending: Physician Assistant | Admitting: Physician Assistant

## 2024-07-02 DIAGNOSIS — Z1231 Encounter for screening mammogram for malignant neoplasm of breast: Secondary | ICD-10-CM | POA: Diagnosis not present

## 2024-07-09 ENCOUNTER — Other Ambulatory Visit: Payer: Self-pay | Admitting: Internal Medicine

## 2024-07-16 DIAGNOSIS — E1122 Type 2 diabetes mellitus with diabetic chronic kidney disease: Secondary | ICD-10-CM | POA: Diagnosis not present

## 2024-07-16 DIAGNOSIS — I129 Hypertensive chronic kidney disease with stage 1 through stage 4 chronic kidney disease, or unspecified chronic kidney disease: Secondary | ICD-10-CM | POA: Diagnosis not present

## 2024-07-16 DIAGNOSIS — N2581 Secondary hyperparathyroidism of renal origin: Secondary | ICD-10-CM | POA: Diagnosis not present

## 2024-07-16 DIAGNOSIS — R809 Proteinuria, unspecified: Secondary | ICD-10-CM | POA: Diagnosis not present

## 2024-07-16 DIAGNOSIS — D631 Anemia in chronic kidney disease: Secondary | ICD-10-CM | POA: Diagnosis not present

## 2024-07-16 DIAGNOSIS — N1832 Chronic kidney disease, stage 3b: Secondary | ICD-10-CM | POA: Diagnosis not present

## 2024-07-31 ENCOUNTER — Telehealth: Payer: Self-pay

## 2024-07-31 ENCOUNTER — Other Ambulatory Visit: Payer: Self-pay

## 2024-07-31 DIAGNOSIS — Z860100 Personal history of colon polyps, unspecified: Secondary | ICD-10-CM

## 2024-07-31 MED ORDER — NA SULFATE-K SULFATE-MG SULF 17.5-3.13-1.6 GM/177ML PO SOLN: 354.0000 mL | Freq: Once | ORAL | 0 refills | Status: AC

## 2024-07-31 MED ORDER — NA SULFATE-K SULFATE-MG SULF 17.5-3.13-1.6 GM/177ML PO SOLN: 354.0000 mL | Freq: Once | ORAL | 0 refills | Status: DC

## 2024-07-31 NOTE — Addendum Note (Signed)
 Addended by: LANNIE ANDREA GRADE on: 07/31/2024 11:11 AM   Modules accepted: Orders

## 2024-07-31 NOTE — Telephone Encounter (Signed)
 Gastroenterology Pre-Procedure Review  Request Date: 11/11/2024 Requesting Physician: Dr. Jinny  PATIENT REVIEW QUESTIONS: The patient responded to the following health history questions as indicated:    1. Are you having any GI issues? no 2. Do you have a personal history of Polyps? yes (05/14/2020 Dr. Unk) 3. Do you have a family history of Colon Cancer or Polyps? no 4. Diabetes Mellitus? yes (Ozempic , Jardiance, Fiasp  insulin , Tresiba ) 5. Joint replacements in the past 12 months?no 6. Major health problems in the past 3 months?no 7. Any artificial heart valves, MVP, or defibrillator?no    MEDICATIONS & ALLERGIES:    Patient reports the following regarding taking any anticoagulation/antiplatelet therapy:   Plavix, Coumadin, Eliquis, Xarelto, Lovenox, Pradaxa, Brilinta, or Effient? no Aspirin ? no  Patient confirms/reports the following medications:  Current Outpatient Medications  Medication Sig Dispense Refill   Acetaminophen  (TYLENOL ) 325 MG CAPS Take by mouth as needed.     allopurinol (ZYLOPRIM) 100 MG tablet Take 100 mg by mouth daily.     amLODipine (NORVASC) 5 MG tablet Take 5 mg by mouth daily.     BD PEN NEEDLE NANO 2ND GEN 32G X 4 MM MISC USE WITH INSULIN  THREE TIMES DAILY 300 each 5   Cholecalciferol 125 MCG (5000 UT) capsule Take 5,000 Units by mouth once a week.     Continuous Blood Gluc Receiver (FREESTYLE LIBRE 2 READER) DEVI 1 each by Does not apply route daily. 1 each 0   Continuous Glucose Sensor (FREESTYLE LIBRE 2 SENSOR) MISC 1 each by Does not apply route every 14 (fourteen) days. E11.65 6 each 3   diphenhydrAMINE (BENADRYL) 50 MG tablet Take 50 mg by mouth as needed.     empagliflozin (JARDIANCE) 10 MG TABS tablet Take 10 mg by mouth daily.     gabapentin (NEURONTIN) 100 MG capsule TAKE 1 TO 3 CAPSULES BY MOUTH NIGHTLY FOR NEUROPATHY PAIN     insulin  aspart (FIASP  FLEXTOUCH) 100 UNIT/ML FlexTouch Pen Inject 10-15 Units into the skin daily before breakfast. 15  mL 3   Insulin  Syringe-Needle U-100 31G X 15/64 0.5 ML MISC Use 3x a day 300 each 3   KERENDIA 10 MG TABS Take 1 tablet by mouth daily.     levothyroxine  (SYNTHROID ) 112 MCG tablet Take 1 tablet by mouth once daily 45 tablet 0   lovastatin (MEVACOR) 20 MG tablet Take 20 mg by mouth daily.     medroxyPROGESTERone  (PROVERA ) 10 MG tablet Take 1 tablet (10 mg total) by mouth daily. 30 tablet 11   Polyvinyl Alcohol-Povidone (REFRESH OP) Apply to eye as needed.     Semaglutide , 1 MG/DOSE, (OZEMPIC , 1 MG/DOSE,) 4 MG/3ML SOPN INJECT 1MG  SUBCUTANEOUSLY ONCE WEEKLY 9 mL 3   terconazole  (TERAZOL 7 ) 0.4 % vaginal cream Place 1 applicator vaginally at bedtime. 45 g 0   torsemide (DEMADEX) 20 MG tablet Take 20 mg by mouth daily.     TRESIBA  FLEXTOUCH 200 UNIT/ML FlexTouch Pen Inject 42 Units into the skin daily. INJECT 70 UNITS SUBCUTANEOUSLY ONCE DAILY     No current facility-administered medications for this visit.    Patient confirms/reports the following allergies:  Allergies  Allergen Reactions   Lisinopril Cough   Meloxicam Other (See Comments)    Joint pain   Prednisone Rash    No orders of the defined types were placed in this encounter.   AUTHORIZATION INFORMATION Primary Insurance: 1D#: Group #:  Secondary Insurance: 1D#: Group #:  SCHEDULE INFORMATION: Date: 11/11/2024 Time: Location: [facility] Dr.  Jinny

## 2024-08-14 DIAGNOSIS — E113511 Type 2 diabetes mellitus with proliferative diabetic retinopathy with macular edema, right eye: Secondary | ICD-10-CM | POA: Diagnosis not present

## 2024-08-20 ENCOUNTER — Other Ambulatory Visit: Payer: Self-pay | Admitting: Internal Medicine

## 2024-08-21 ENCOUNTER — Other Ambulatory Visit: Payer: Self-pay | Admitting: Internal Medicine

## 2024-09-17 ENCOUNTER — Ambulatory Visit: Admitting: Internal Medicine

## 2024-09-17 ENCOUNTER — Other Ambulatory Visit

## 2024-09-17 ENCOUNTER — Encounter: Payer: Self-pay | Admitting: Internal Medicine

## 2024-09-17 VITALS — BP 120/70 | HR 85 | Ht 67.5 in | Wt 233.2 lb

## 2024-09-17 DIAGNOSIS — E1165 Type 2 diabetes mellitus with hyperglycemia: Secondary | ICD-10-CM

## 2024-09-17 DIAGNOSIS — C73 Malignant neoplasm of thyroid gland: Secondary | ICD-10-CM

## 2024-09-17 DIAGNOSIS — Z794 Long term (current) use of insulin: Secondary | ICD-10-CM

## 2024-09-17 DIAGNOSIS — Z7984 Long term (current) use of oral hypoglycemic drugs: Secondary | ICD-10-CM

## 2024-09-17 DIAGNOSIS — E890 Postprocedural hypothyroidism: Secondary | ICD-10-CM

## 2024-09-17 DIAGNOSIS — E7849 Other hyperlipidemia: Secondary | ICD-10-CM

## 2024-09-17 DIAGNOSIS — Z7985 Long-term (current) use of injectable non-insulin antidiabetic drugs: Secondary | ICD-10-CM | POA: Diagnosis not present

## 2024-09-17 LAB — POCT GLYCOSYLATED HEMOGLOBIN (HGB A1C): Hemoglobin A1C: 7.1 % — AB (ref 4.0–5.6)

## 2024-09-17 MED ORDER — TRESIBA FLEXTOUCH 200 UNIT/ML ~~LOC~~ SOPN: 36.0000 [IU] | PEN_INJECTOR | Freq: Every day | SUBCUTANEOUS | Status: AC

## 2024-09-17 NOTE — Progress Notes (Addendum)
 Patient ID: Courtney [name], female   DOB: [birth year], 69 y.o.   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 Last visit 3.5 months ago. PCP: [facility] [place], [place].  Interim history: No blurry vision, nausea, chest pain. She has L eye blurry vision.  She is preparing for cornea surgery.  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 She still drinks sweet drinks -sweet tea and ginger ale but reduced the amount.  DM2: Reviewed HbA1c levels: Lab Results  Component Value Date   HGBA1C 7.6 (A) 06/05/2024   HGBA1C 7.1 (A) 01/23/2024   HGBA1C 7.3 (A) 09/21/2023   HGBA1C 7.1 (A) 05/22/2023   HGBA1C 6.9 (A) 01/16/2023   HGBA1C 6.8 (A) 09/09/2022   HGBA1C 7.1 (A) 05/04/2022   HGBA1C 7.8 (A) 08/26/2021   HGBA1C 7.0 (A) 04/26/2021   HGBA1C 6.9 (A) 12/23/2020   HGBA1C 6.8 (A) 08/19/2020   HGBA1C 7.8 (A) 05/19/2020   HGBA1C 10.7 (A) 02/13/2020   HGBA1C 12.6 (A) 11/13/2019   HGBA1C 9.8 (A) 05/13/2019   HGBA1C 13.7 (A) 10/23/2018   HGBA1C 10.1 (A) 06/25/2018   HGBA1C 8.6 (A) 02/14/2018   HGBA1C 8.3 11/13/2017   HGBA1C 9.1 08/16/2017   HGBA1C 11.3 05/31/2017   HGBA1C 11.9 02/24/2017   HGBA1C 9.5 11/25/2016   HGBA1C 8.2 (H) 04/13/2016   HGBA1C 9.1 02/05/2016   HGBA1C 9.8 (H) 09/18/2015   HGBA1C 9.9 06/19/2015   HGBA1C 11.0 03/27/2015   HGBA1C 10.9 (H) 12/26/2014  08/22/2016: HbA1c 11.8% 08/01/2014: HbA1c 14.5% 05/02/2014: HbA1c 14.8% 01/31/2014: HbA1c 13.6% 11/01/2013: HbA1c 15.1%  She is on: - Tresiba  70 >> 60 >> 50 >> 42 units in a.m. - Novolin  Regular insulin  15-18 units before b'fast  >> FiAsp  10-15 units before b'fast >> before all meals (not when eating out) - Ozempic  0.5 >> 1 mg weekly  - Jardiance 10 mg daily - started by Dr. Douglas  01/2021 Stopped Metformin  500 mg 2x a day - b/c leg swelling. She was on Novolin  70/30 48 units 2x a day 15 min before the meals  She was on Amaryl 8 mg in am She was on Metformin  >> stopped 2/2 CKD. She was previously on Toujeo . She was previously on Farxiga.  She checks sugars >4x a day with the Libre CGM - receiver (from [place]):  Previously:  Previously:  Lowest sugar was 50s >> 67 >> 60s; it is unclear at which level she has hypoglycemia Highest sugar was 325 >> ... 200s >> 300 x1 >> 300.  Glucometer: Prodigy >> ReliOn  Pt's meals are: - Breakfast: chicken  - Lunch: peanuts, sandwich + crystal lite drink - Dinner: frozen dinner - Snacks: fruit, small bag potato chips, peanuts She is not having regular meals.  She continues to drink sweet tea.  -+ Stage IIIb CKD -seeing nephrology. 02/19/2024: 16/1.72, GFR 32, glucose 115 01/10/2024: PCR 417 (<184) 11/29/2023: 13/1.55, GFR 36, glucose 60 06/28/2023: 18/1.72, GFR 32, PCR 433 (<184) Lab Results  Component Value Date   BUN 15 09/01/2021   Lab Results  Component Value Date   CREATININE 1.33 (H) 09/01/2021  On Cozaar.  -+ HL; lipids: 11/29/2023: 133/70/68/51 Lab Results  Component Value Date   CHOL 143 08/26/2021   HDL 77.60 08/26/2021   LDLCALC 51 08/26/2021   TRIG 70.0 08/26/2021  CHOLHDL 2 08/26/2021  On lovastatin 20.  - last eye exam was on 01/09/2024: + DR; she had laser surgeries and is back on intraocular injections for macular edema.  She had left eye cataract surgery. Will have OS cornea sx. Soon.  - She has numbness and tingling in her right foot -sees Dr. Ashley.  Last  foot exam was here in the clinic 09/21/2023.  On gabapentin by PCP.  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  - She did not have RAI  treatment at that time, however, she was found to have a thyroid  mass in 2005 >> This was biopsied and returned as normal thyroid  tissue, but had RAI treatment at that time.  - The posttreatment whole-body scan was negative for any cancer spread.   Thyroglobulin returned at 2.3 in 08/2014 >> I ordered a neck ultrasound that showed a stable nodule with a fatty hilum, consistent with a benign lymph node, but no other masses in the surgical bed.   The thyroglobulin level remained detectable, and was actually 9.9 in 10/2015.   A neck ultrasound (11/2015) showed a stable 1.4 nodule in the thyroid  bed, consistent with a benign process   Her thyroglobulin levels continue to be detectable  A neck ultrasound (02/2020) was negative for metastasis or recurrences  A whole-body scan (03/2020) was also negative for abnormal uptake  Reviewed her thyroglobulin and ATA antibodies: Lab Results  Component Value Date   THYROGLB 4.1 06/05/2024   THYROGLB 3.0 05/22/2023   THYROGLB 3.1 01/16/2023   THYROGLB 2.9 12/30/2021   THYROGLB 2.3 (L) 04/26/2021   THYROGLB 2.0 (L) 08/19/2020   THYROGLB 3.3 02/13/2020   THYROGLB 2.1 (L) 05/13/2019   THYROGLB 2.5 (L) 06/25/2018   THYROGLB 1.8 (L) 08/15/2017   THGAB <1 06/05/2024   THGAB <1 05/22/2023   THGAB <1 01/16/2023   THGAB <1 12/30/2021   THGAB <1 04/26/2021   THGAB <1 08/19/2020   THGAB <1 02/13/2020   THGAB <1 05/13/2019   THGAB <1 06/25/2018   THGAB <1 08/15/2017   Postsurgical hypothyroidism:  Reviewed her TFTs: Lab Results  Component Value Date   TSH 0.36 (L) 06/05/2024   TSH 0.41 03/18/2024   TSH 0.07 (L) 01/23/2024   TSH 0.27 (L) 05/22/2023   TSH 0.47 01/16/2023   FREET4 1.5 03/18/2024   FREET4 1.8 01/23/2024   FREET4 1.34 01/16/2023   FREET4 1.70 (H) 05/04/2022   FREET4 1.80 (H) 12/30/2021  11/29/2023: TSH 0.066  Pt is on levothyroxine  112 mcg daily (dose decreased 01/2024): - fasting - at least 30 min from b'fast - no calcium -  no iron - no multivitamins - no PPIs - not on Biotin  No FH of thyroid  cancer. No h/o radiation tx to head or neck other than RAI treatment. No Biotin use. No recent steroids use.   Pt denies: - feeling nodules in neck - hoarseness - dysphagia - choking  She has a h/o irregular menses. She developed postmenopausal bleeding in 2019 >> she had D&C on 09/01/2021.  She is on vitamin D  5000 units once a week.  ROS: + see HPI  I reviewed pt's medications, allergies, PMH, social hx, family hx, and changes were documented in the history of present illness. Otherwise, unchanged from my initial visit note.  Past Medical  History:  Diagnosis Date   Anemia associated with chronic renal failure    Arthritis    Chronic kidney disease (CKD), stage III (moderate) (HCC)    nephrologist-- dr s. douglas   Diabetic retinopathy of both eyes (HCC)    followed by dr c. haddad in [place];  pt stated gets eye injections every 6 months   History of COVID-19 2020   per pt mild to moderate symptoms , no hospital admission,  symptoms resolved with exceptin taste/ smell still not right   History of thyroid  cancer 09/2002   endocrinologist--- dr trixie;  s/p total thyroidectomy [id number] for multinodular goiter, dx follicular varient papillary thyroid  cancer, no chemo/ radiation;   RAI for normal bx of mass in 2005;  no recurrence   Hx MRSA infection 06/2012   right thigh abscess with sepsis, hospital admission   Hyperlipidemia    Hypertension    Hypothyroidism, postsurgical 2004   endocrinologist-  dr trixie;   10-03-2002  s/p total thyroidectomy for multinodular goiter/ thyroid  cancer   Insulin  dependent type 2 diabetes mellitus [facility])    endocrinologist--- dr trixie;    (08-27-2021 per pt does not check blood sugar at home, only every once in a while)   Neuropathy due to secondary diabetes (HCC)    PMB (postmenopausal bleeding)    Secondary hyperparathyroidism of renal origin    Wears glasses     Past Surgical History:  Procedure Laterality Date   APPLICATION OF WOUND VAC     07-11-2012 and 07-14-2012 @[facility] ;   to right thigh wound with dressing change   BILATERAL CARPAL TUNNEL RELEASE Bilateral 2003   BREAST BIOPSY Right 07/12/2023   US  RT BREAST BX W LOC DEV 1ST LESION IMG BX SPEC US  GUIDE 07/12/2023 [facility]   CATARACT EXTRACTION W/ INTRAOCULAR LENS IMPLANT Bilateral    left 2016;  right 2018   CHOLECYSTECTOMY OPEN     1990s   COLONOSCOPY WITH PROPOFOL  N/A 05/14/2020   Procedure: COLONOSCOPY WITH PROPOFOL ;  Surgeon: Unk Corinn Skiff, MD;  Location: [facility] ENDOSCOPY;  Service: Gastroenterology;  Laterality: N/A;   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   HYSTEROSCOPY WITH D & C N/A 09/01/2021   Procedure: DILATATION AND CURETTAGE LELDON NAIL;  Surgeon: Zina Jerilynn LABOR, MD;  Location: [facility];  Service: Gynecology;  Laterality: N/A;   INCISION AND DRAINAGE ABSCESS Right 07/08/2012   @[facility] ;  w/ excisional debridement right thigh   KNEE ARTHROSCOPY Left 05/05/2016   Procedure: ARTHROSCOPY KNEE;  Surgeon: Franky Cranker, MD;  Location: [facility] ORS;  Service: Orthopedics;  Laterality: Left;   SHOULDER ARTHROSCOPY Left 03/2002   TOTAL THYROIDECTOMY Bilateral 10/03/2002   @[facility]    TRIGGER FINGER RELEASE     1990s ;   right hand   History   Social History Main Topics   Smoking status: Former Smoker   Smokeless tobacco: Not on file   Alcohol Use: No   Drug Use: No   Social History Narrative   Single   0 children   Warden/ranger and wine on occasion   First menstrual cycle: 6th grade   Postmenopausal      Current Outpatient Medications  Medication Sig Dispense Refill   Acetaminophen  (TYLENOL ) 325 MG CAPS Take by mouth as needed.     allopurinol (ZYLOPRIM) 100 MG tablet Take 100 mg by mouth daily.     amLODipine (NORVASC) 5 MG tablet Take 5 mg by mouth  daily.     BD PEN NEEDLE NANO 2ND GEN 32G X 4 MM MISC USE WITH  INSULIN  THREE TIMES DAILY 300 each 5   Cholecalciferol 125 MCG (5000 UT) capsule Take 5,000 Units by mouth once a week.     Continuous Blood Gluc Receiver (FREESTYLE LIBRE 2 READER) DEVI 1 each by Does not apply route daily. 1 each 0   Continuous Glucose Sensor (FREESTYLE LIBRE 2 SENSOR) MISC 1 each by Does not apply route every 14 (fourteen) days. E11.65 6 each 3   diphenhydrAMINE (BENADRYL) 50 MG tablet Take 50 mg by mouth as needed.     empagliflozin (JARDIANCE) 10 MG TABS tablet Take 10 mg by mouth daily.     gabapentin (NEURONTIN) 100 MG capsule TAKE 1 TO 3 CAPSULES BY MOUTH NIGHTLY FOR NEUROPATHY PAIN     insulin  aspart (FIASP  FLEXTOUCH) 100 UNIT/ML FlexTouch Pen Inject 10-15 Units into the skin 3 (three) times daily before meals. 45 mL 3   Insulin  Syringe-Needle U-100 31G X 15/64 0.5 ML MISC Use 3x a day 300 each 3   KERENDIA 10 MG TABS Take 1 tablet by mouth daily.     levothyroxine  (SYNTHROID ) 112 MCG tablet Take 1 tablet (112 mcg total) by mouth daily. 90 tablet 3   lovastatin (MEVACOR) 20 MG tablet Take 20 mg by mouth daily.     medroxyPROGESTERone  (PROVERA ) 10 MG tablet Take 1 tablet (10 mg total) by mouth daily. 30 tablet 11   Polyvinyl Alcohol-Povidone (REFRESH OP) Apply to eye as needed.     Semaglutide , 1 MG/DOSE, (OZEMPIC , 1 MG/DOSE,) 4 MG/3ML SOPN INJECT 1MG  SUBCUTANEOUSLY ONCE WEEKLY 9 mL 3   terconazole  (TERAZOL 7 ) 0.4 % vaginal cream Place 1 applicator vaginally at bedtime. 45 g 0   torsemide (DEMADEX) 20 MG tablet Take 20 mg by mouth daily.     TRESIBA  FLEXTOUCH 200 UNIT/ML FlexTouch Pen Inject 42 Units into the skin daily. 27 mL 2   No current facility-administered medications for this visit.   NKDA   Family History  Problem Relation Age of Onset   Hypertension Mother    Thyroid  disease Mother    CVA Mother    Hypertension Sister    Thyroid  disease Sister    Hypertension Brother    Hyperlipidemia Brother    Breast cancer Neg Hx    PE:  BP 120/70   Pulse 85    Ht 5' 7.5 (1.715 m)   Wt 233 lb 3.2 oz (105.8 kg)   SpO2 99%   BMI 35.99 kg/m  Wt Readings from Last 3 Encounters:  09/17/24 233 lb 3.2 oz (105.8 kg)  06/05/24 232 lb 6.4 oz (105.4 kg)  01/23/24 229 lb 6.4 oz (104.1 kg)   Constitutional: overweight, in NAD Eyes: no exophthalmos ENT: no masses palpated in neck, no cervical lymphadenopathy Cardiovascular: RRR, No MRG Respiratory: CTA B Musculoskeletal: no deformities Skin:  no rashes Neurological: no tremor with outstretched hands  ASSESSMENT: 1. DM2, insulin -dependent, uncontrolled, with complications - CKD - DR  She does not have a family history of medullary thyroid  cancer a personal history of pancreatitis.  2. 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 She did not have RAI treatment at that time, however, she was found to have a thyroid  mass in 2005. This was biopsied and returned as normal thyroid  tissue.She had RAI treatment  at that time. The posttreatment whole-body scan was negative for any cancers.  - Neck U/S (09/19/2014): 1. Post total thyroidectomy. 2. No change in the previously biopsied approximately 1.4 cm echogenic solid nodule within the inferior aspect of the right lobe of the thyroid , grossly unchanged since the 2005 examination. 3. Apparent development of an approximately 0.4 cm hypoechoic nodule within the right thyroidectomy bed - while too small for definitive characterization, this nodule appears to contain an echogenic hilum and thus is favored to represent a non pathologically enlarged cervical lymph node.  - Neck U/S (11/27/2015):  1. Surgical changes of prior total thyroidectomy. 2. Unchanged 1.4 cm echogenic soft tissue nodule in the right thyroid  resection bed. Continued stability over time consistent with a benign process.  -  Neck U/S (03/02/2020): Stable surgical changes following total thyroidectomy. No residual thyroid  bed abnormality.  - WBS (03/27/2020): her copay: 6800$!!  No evidence of local thyroid  cancer recurrence or distant metastasis by I 131 imaging.   3. Post surgical hypothyroidism  PLAN:  1. Patient with longstanding, uncontrolled, type 2 diabetes, basal-bolus insulin  regimen along with weekly GLP-1 receptor agonist and daily SGLT2 inhibitor, with still suboptimal control.  At last visit, HbA1c was higher, at 7.6%, increased from 7.1%.  Sugars appear to be higher than before, increasing after every meal and improving overnight.  Upon questioning, she was not able to stop or even reduce sodas and sweet tea.  We discussed that this was absolutely mandatory to be able to improve her blood sugars.  She was willing to try again to stop these.  Upon her questioning, we discussed about fruit and the best way to eat them depending on the glycemic index.  I advised her to take Fiasp  before all meals but we did not change the rest of the regimen.  CGM interpretation: -At today's visit, we reviewed her CGM downloads: It appears that 92% of values are in target range (goal >70%), while 6% are higher than 180 (goal <25%), and 2% are lower than 70 (goal <4%).  The calculated average blood sugar is 124.  The projected HbA1c for the next 3 months (GMI) is 6.3%. -Reviewing the CGM trends, sugars are at goal during most of the day, but trending down gradually overnight with a significant increase in blood sugar after lunch and occasionally after dinner.  Upon questioning, she is taking Fiasp  correctly, we will give her every meal, with the exception of the time when she is eating out, which she is not taking her pen with her.  She keeps the pens in the fridge.  I advised her to leave out the pen and use and to take it with her whenever planning to eat out.  She agrees to do so.  Since her sugars are trending down overnight, I  advised her to reduce the dose of her basal insulin .  We can continue the rest of the regimen for now.  I also advised her to continue her efforts to stop sweet drinks.  She reduced since last visit. - I advised her to: Patient Instructions  Please continue: - Jardiance 10 mg before b'fast - Ozempic  1 mg weekly in a.m.  - FiAsp  10-15 units before all meals (take the pen with you if eating out)  Decrease: - Tresiba  36-38 units in am  STOP GINGER ALE AND SWEET TEA!  Please continue levothyroxine  112 mcg daily.  Take the thyroid  hormone every day, with water, at least 30 minutes before breakfast, separated by  at least 4 hours from: - acid reflux medications - calcium - iron - multivitamins  Please stop at the lab.  Please return in 4 months.  - we checked her HbA1c: 7.1% (lower) - advised to check sugars at different times of the day - 4x a day, rotating check times - advised for yearly eye exams >> she is UTD - return to clinic in 4 months       2. 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  He has persistently detectable thyroglobulin. -I reviewed the results of her neck ultrasound from 11/2015, which showed a stable mass, consistent with a benign process.  A repeat neck ultrasound in 02/2020 showed stable surgical changes with no residual thyroid  abnormalities.  We checked a whole-body scan in 03/2020 and this did not show any evidence of local recurrence or metastasis.  However, this was a very expensive test and she had to pay approximately $7000 for it.  As a consequence, she declined further imaging studies. - As mentioned above, her thyroglobulin level remains detectable but not much fluctuating in the last 8 years except at last visit when thyroglobulin was slightly higher, at 4.1, previously 3.0 and 3.1 respectively in 2024.  We did discuss about possibly obtaining a PET scan in the next CT  but due to the high co-pay, she prefers to only be followed by thyroglobulin - No neck compression symptoms - We will recheck her thyroglobulin and ATA now  3. Postsurgical hypothyroidism - latest thyroid  labs reviewed with pt. >> TSH was slightly low, but still at goal in the setting of thyroid  cancer with detectable thyroglobulin: Lab Results  Component Value Date   TSH 0.36 (L) 06/05/2024  - she continues on LT4 112 mcg daily - pt feels good on this dose. - we discussed about taking the thyroid  hormone every day, with water, >30 minutes before breakfast, separated by >4 hours from acid reflux medications, calcium, iron, multivitamins. Pt. is taking it correctly. - will check thyroid  tests today: TSH  - If labs are abnormal, she will need to return for repeat TFTs in 1.5 months  Component     Latest Ref Rng 09/17/2024  Hemoglobin A1C     4.0 - 5.6 % 7.1 !   TSH     0.40 - 4.50 mIU/L 0.29 (L)   Thyroglobulin Ab     < or = 1 IU/mL <1   Thyroglobulin     ng/mL 3.4   Comment -   TSH is slightly low, but at goal in the setting of an elevated thyroglobulin.  However, this has improved from last check and is consistent with prior.  I will suggest to continue the current regimen.  Lela Fendt, MD PhD [facility] Endocrinology

## 2024-09-17 NOTE — Patient Instructions (Addendum)
 Please continue: - Jardiance 10 mg before b'fast - Ozempic  1 mg weekly in a.m.  - FiAsp  10-15 units before all meals (take the pen with you if eating out)  Decrease: - Tresiba  36-38 units in am  STOP GINGER ALE AND SWEET TEA!  Please continue levothyroxine  112 mcg daily.  Take the thyroid  hormone every day, with water, at least 30 minutes before breakfast, separated by at least 4 hours from: - acid reflux medications - calcium - iron - multivitamins  Please stop at the lab.  Please return in 4 months.

## 2024-09-17 NOTE — Addendum Note (Signed)
 Addended by: CLEOTILDE ROLIN RAMAN on: 09/17/2024 01:04 PM   Modules accepted: Orders

## 2024-09-19 LAB — TSH: TSH: 0.29 m[IU]/L — ABNORMAL LOW (ref 0.40–4.50)

## 2024-09-19 LAB — THYROGLOBULIN ANTIBODY: Thyroglobulin Ab: <1 [IU]/mL

## 2024-09-19 LAB — THYROGLOBULIN LEVEL: Thyroglobulin: 3.4 ng/mL

## 2024-09-20 ENCOUNTER — Ambulatory Visit: Payer: Self-pay | Admitting: Internal Medicine

## 2024-10-18 ENCOUNTER — Other Ambulatory Visit: Payer: Self-pay

## 2024-10-18 MED ORDER — FREESTYLE LIBRE 2 PLUS SENSOR MISC: 1.0000 | 3 refills | Status: AC

## 2024-11-11 ENCOUNTER — Ambulatory Visit: Admit: 2024-11-11 | Admitting: Gastroenterology

## 2024-11-11 SURGERY — COLONOSCOPY
Anesthesia: General

## 2025-01-21 ENCOUNTER — Ambulatory Visit: Admitting: Internal Medicine
